# Patient Record
Sex: Male | Born: 1955 | Race: White | Hispanic: No | Marital: Married | Smoking: Never smoker
Health system: Southern US, Community
[De-identification: ages and names within clinical notes are randomized; demographics above are authoritative.]

## PROBLEM LIST (undated history)

## (undated) DIAGNOSIS — E785 Hyperlipidemia, unspecified: Secondary | ICD-10-CM

## (undated) DIAGNOSIS — K5792 Diverticulitis of intestine, part unspecified, without perforation or abscess without bleeding: Secondary | ICD-10-CM

## (undated) DIAGNOSIS — K219 Gastro-esophageal reflux disease without esophagitis: Secondary | ICD-10-CM

## (undated) DIAGNOSIS — T7840XA Allergy, unspecified, initial encounter: Secondary | ICD-10-CM

## (undated) DIAGNOSIS — D126 Benign neoplasm of colon, unspecified: Secondary | ICD-10-CM

## (undated) DIAGNOSIS — C801 Malignant (primary) neoplasm, unspecified: Secondary | ICD-10-CM

## (undated) DIAGNOSIS — I10 Essential (primary) hypertension: Secondary | ICD-10-CM

## (undated) DIAGNOSIS — K56609 Unspecified intestinal obstruction, unspecified as to partial versus complete obstruction: Secondary | ICD-10-CM

## (undated) DIAGNOSIS — E119 Type 2 diabetes mellitus without complications: Secondary | ICD-10-CM

## (undated) DIAGNOSIS — G4730 Sleep apnea, unspecified: Secondary | ICD-10-CM

## (undated) HISTORY — DX: Allergy, unspecified, initial encounter: T78.40XA

## (undated) HISTORY — DX: Benign neoplasm of colon, unspecified: D12.6

## (undated) HISTORY — DX: Type 2 diabetes mellitus without complications: E11.9

## (undated) HISTORY — DX: Sleep apnea, unspecified: G47.30

## (undated) HISTORY — DX: Essential (primary) hypertension: I10

## (undated) HISTORY — PX: COLONOSCOPY: SHX174

## (undated) HISTORY — DX: Hyperlipidemia, unspecified: E78.5

## (undated) HISTORY — DX: Diverticulitis of intestine, part unspecified, without perforation or abscess without bleeding: K57.92

## (undated) HISTORY — DX: Unspecified intestinal obstruction, unspecified as to partial versus complete obstruction: K56.609

## (undated) HISTORY — DX: Malignant (primary) neoplasm, unspecified: C80.1

## (undated) HISTORY — DX: Gastro-esophageal reflux disease without esophagitis: K21.9

---

## 1995-12-16 HISTORY — PX: VASECTOMY: SHX75

## 2003-03-30 ENCOUNTER — Encounter: Payer: Self-pay | Admitting: Family Medicine

## 2004-11-26 ENCOUNTER — Ambulatory Visit: Payer: Self-pay | Admitting: Family Medicine

## 2004-12-26 ENCOUNTER — Ambulatory Visit: Payer: Self-pay | Admitting: Family Medicine

## 2005-01-06 ENCOUNTER — Ambulatory Visit: Payer: Self-pay | Admitting: Family Medicine

## 2005-05-15 ENCOUNTER — Ambulatory Visit: Payer: Self-pay | Admitting: Family Medicine

## 2005-05-19 ENCOUNTER — Ambulatory Visit: Payer: Self-pay | Admitting: Family Medicine

## 2005-09-19 ENCOUNTER — Ambulatory Visit: Payer: Self-pay | Admitting: Family Medicine

## 2005-09-30 ENCOUNTER — Ambulatory Visit: Payer: Self-pay | Admitting: Family Medicine

## 2006-09-22 ENCOUNTER — Ambulatory Visit: Payer: Self-pay | Admitting: Family Medicine

## 2007-02-18 ENCOUNTER — Ambulatory Visit: Payer: Self-pay | Admitting: Family Medicine

## 2007-06-15 ENCOUNTER — Encounter: Payer: Self-pay | Admitting: Family Medicine

## 2007-06-15 DIAGNOSIS — J309 Allergic rhinitis, unspecified: Secondary | ICD-10-CM | POA: Insufficient documentation

## 2007-06-15 DIAGNOSIS — E7800 Pure hypercholesterolemia, unspecified: Secondary | ICD-10-CM

## 2007-06-17 ENCOUNTER — Ambulatory Visit: Payer: Self-pay | Admitting: Family Medicine

## 2007-06-21 LAB — CONVERTED CEMR LAB
Albumin: 3.4 g/dL — ABNORMAL LOW (ref 3.5–5.2)
Alkaline Phosphatase: 51 units/L (ref 39–117)
BUN: 12 mg/dL (ref 6–23)
Direct LDL: 159.7 mg/dL
GFR calc Af Amer: 115 mL/min
GFR calc non Af Amer: 95 mL/min
LDL Cholesterol: 147 mg/dL — ABNORMAL HIGH (ref 0–99)
PSA: 1.14 ng/mL (ref 0.10–4.00)
Potassium: 4.4 meq/L (ref 3.5–5.1)
Sodium: 141 meq/L (ref 135–145)
TSH: 2.69 microintl units/mL (ref 0.35–5.50)
Total CHOL/HDL Ratio: 5
Triglycerides: 83 mg/dL (ref 0–149)
VLDL: 17 mg/dL (ref 0–40)

## 2007-06-22 ENCOUNTER — Ambulatory Visit: Payer: Self-pay | Admitting: Family Medicine

## 2007-06-22 DIAGNOSIS — E1169 Type 2 diabetes mellitus with other specified complication: Secondary | ICD-10-CM | POA: Insufficient documentation

## 2007-06-22 DIAGNOSIS — E119 Type 2 diabetes mellitus without complications: Secondary | ICD-10-CM | POA: Insufficient documentation

## 2007-06-22 DIAGNOSIS — E669 Obesity, unspecified: Secondary | ICD-10-CM

## 2007-09-24 ENCOUNTER — Ambulatory Visit: Payer: Self-pay | Admitting: Family Medicine

## 2008-06-12 ENCOUNTER — Encounter: Payer: Self-pay | Admitting: Family Medicine

## 2008-09-18 ENCOUNTER — Ambulatory Visit: Payer: Self-pay | Admitting: Family Medicine

## 2008-12-15 DIAGNOSIS — D126 Benign neoplasm of colon, unspecified: Secondary | ICD-10-CM

## 2008-12-15 HISTORY — DX: Benign neoplasm of colon, unspecified: D12.6

## 2009-02-13 ENCOUNTER — Ambulatory Visit: Payer: Self-pay | Admitting: Family Medicine

## 2009-02-18 LAB — CONVERTED CEMR LAB
ALT: 34 units/L (ref 0–53)
Albumin: 3.6 g/dL (ref 3.5–5.2)
Alkaline Phosphatase: 57 units/L (ref 39–117)
BUN: 12 mg/dL (ref 6–23)
Bilirubin, Direct: 0.2 mg/dL (ref 0.0–0.3)
CO2: 29 meq/L (ref 19–32)
Calcium: 8.9 mg/dL (ref 8.4–10.5)
GFR calc Af Amer: 114 mL/min
Glucose, Bld: 113 mg/dL — ABNORMAL HIGH (ref 70–99)
HDL: 41.7 mg/dL (ref >39.0)
PSA: 0.92 ng/mL (ref 0.10–4.00)
TSH: 2.97 microintl units/mL (ref 0.35–5.50)
Total Protein: 7.1 g/dL (ref 6.0–8.3)
Triglycerides: 75 mg/dL (ref 0–149)

## 2009-02-19 ENCOUNTER — Ambulatory Visit: Payer: Self-pay | Admitting: Family Medicine

## 2009-02-19 DIAGNOSIS — I10 Essential (primary) hypertension: Secondary | ICD-10-CM | POA: Insufficient documentation

## 2009-03-12 ENCOUNTER — Ambulatory Visit: Payer: Self-pay | Admitting: Gastroenterology

## 2009-03-22 ENCOUNTER — Ambulatory Visit: Payer: Self-pay | Admitting: Family Medicine

## 2009-03-26 ENCOUNTER — Ambulatory Visit: Payer: Self-pay | Admitting: Gastroenterology

## 2009-03-26 ENCOUNTER — Encounter: Payer: Self-pay | Admitting: Gastroenterology

## 2009-03-28 ENCOUNTER — Encounter: Payer: Self-pay | Admitting: Gastroenterology

## 2009-07-02 ENCOUNTER — Encounter (INDEPENDENT_AMBULATORY_CARE_PROVIDER_SITE_OTHER): Payer: Self-pay | Admitting: *Deleted

## 2009-07-10 ENCOUNTER — Ambulatory Visit: Payer: Self-pay | Admitting: Family Medicine

## 2009-08-14 ENCOUNTER — Telehealth: Payer: Self-pay | Admitting: Family Medicine

## 2009-08-14 ENCOUNTER — Encounter: Payer: Self-pay | Admitting: Family Medicine

## 2009-08-21 ENCOUNTER — Encounter (INDEPENDENT_AMBULATORY_CARE_PROVIDER_SITE_OTHER): Payer: Self-pay | Admitting: *Deleted

## 2009-11-12 ENCOUNTER — Encounter: Payer: Self-pay | Admitting: Family Medicine

## 2010-07-17 ENCOUNTER — Encounter (INDEPENDENT_AMBULATORY_CARE_PROVIDER_SITE_OTHER): Payer: Self-pay | Admitting: *Deleted

## 2010-10-03 ENCOUNTER — Encounter: Payer: Self-pay | Admitting: Family Medicine

## 2011-01-14 NOTE — Letter (Signed)
Summary: Nadara Eaton letter  [place] [facility]  [address] [place], [place] [address]   Phone: [phone]  Fax: [phone]       07/17/2010 MRN: [id number]  [facility] [address] [place], [place]  [address]  Dear Mr. HACKMAN,  [facility] [place], and [facility] announce the retirement of Arta Silence, M.D., from full-time practice at the [facility] office effective June 13, 2010 and his plans of returning part-time.  It is important to Dr. Hetty Ely and to our practice that you understand that [facility] has seven physicians in our office for your health care needs.  We will continue to offer the same exceptional care that you have today.    Dr. Hetty Ely has spoken to many of you about his plans for retirement and returning part-time in the fall.   We will continue to work with you through the transition to schedule appointments for you in the office and meet the high standards that [place] is committed to.   Again, it is with great pleasure that we share the news that Dr. Hetty Ely will return to [facility] at [facility] in October of 2011 with a reduced schedule.    If you have any questions, or would like to request an appointment with one of our physicians, please call us at [phone] and press the option for Scheduling an appointment.  We take pleasure in providing you with excellent patient care and look forward to seeing you at your next office visit.  Our [facility] Physicians are:  Tillman Abide, M.D. Laurita Quint, M.D. Roxy Manns, M.D. Kerby Nora, M.D. Hannah Beat, M.D. Ruthe Mannan, M.D. We proudly welcomed Raechel Ache, M.D. and Eustaquio Boyden, M.D. to the practice in July/August 2011.  Sincerely,  [place] [facility]

## 2011-01-14 NOTE — Miscellaneous (Signed)
Summary: Flu vaccine  Clinical Lists Changes  Observations: Added new observation of FLU VAX: Historical (09/23/2010 10:34)      Influenza Immunization History:    Influenza # 1:  Historical (09/23/2010) Received form from [facility]/ [address] [place], [place]

## 2012-09-24 ENCOUNTER — Telehealth: Payer: Self-pay | Admitting: Family Medicine

## 2012-09-24 NOTE — Telephone Encounter (Signed)
Fine by me 

## 2012-09-24 NOTE — Telephone Encounter (Signed)
Okay with me if okay with Dr Reece Agar.

## 2012-09-24 NOTE — Telephone Encounter (Signed)
Please schedule

## 2012-10-13 ENCOUNTER — Ambulatory Visit: Payer: Self-pay

## 2012-10-13 ENCOUNTER — Other Ambulatory Visit: Payer: Self-pay | Admitting: Occupational Medicine

## 2012-10-13 DIAGNOSIS — M25561 Pain in right knee: Secondary | ICD-10-CM

## 2012-10-28 ENCOUNTER — Other Ambulatory Visit: Payer: Self-pay | Admitting: Family Medicine

## 2012-10-30 ENCOUNTER — Other Ambulatory Visit: Payer: Self-pay | Admitting: Family Medicine

## 2012-10-30 DIAGNOSIS — E7800 Pure hypercholesterolemia, unspecified: Secondary | ICD-10-CM

## 2012-10-30 DIAGNOSIS — Z125 Encounter for screening for malignant neoplasm of prostate: Secondary | ICD-10-CM

## 2012-11-05 ENCOUNTER — Other Ambulatory Visit (INDEPENDENT_AMBULATORY_CARE_PROVIDER_SITE_OTHER): Payer: Self-pay

## 2012-11-05 DIAGNOSIS — Z125 Encounter for screening for malignant neoplasm of prostate: Secondary | ICD-10-CM

## 2012-11-05 DIAGNOSIS — E7800 Pure hypercholesterolemia, unspecified: Secondary | ICD-10-CM

## 2012-11-05 LAB — COMPREHENSIVE METABOLIC PANEL
AST: 24 U/L (ref 0–37)
Albumin: 3.7 g/dL (ref 3.5–5.2)
Alkaline Phosphatase: 59 U/L (ref 39–117)
BUN: 14 mg/dL (ref 6–23)
Potassium: 4.3 mEq/L (ref 3.5–5.1)
Sodium: 136 mEq/L (ref 135–145)
Total Protein: 7.1 g/dL (ref 6.0–8.3)

## 2012-11-05 LAB — LIPID PANEL
HDL: 43.2 mg/dL (ref >39.00)
VLDL: 13.6 mg/dL (ref 0.0–40.0)

## 2012-11-05 LAB — LDL CHOLESTEROL, DIRECT: Direct LDL: 157.7 mg/dL

## 2012-11-09 ENCOUNTER — Encounter: Payer: Self-pay | Admitting: Family Medicine

## 2012-11-11 ENCOUNTER — Telehealth: Payer: Self-pay | Admitting: Family Medicine

## 2012-11-11 NOTE — Telephone Encounter (Signed)
Called pt's wife.  I am currently sick and will need to cancel clinic tomorrow.  I called them to reschedule.  Pt's wife will pass the message along.

## 2012-11-12 ENCOUNTER — Encounter: Payer: Self-pay | Admitting: Family Medicine

## 2012-11-15 ENCOUNTER — Encounter: Payer: Self-pay | Admitting: Family Medicine

## 2012-11-15 ENCOUNTER — Ambulatory Visit (INDEPENDENT_AMBULATORY_CARE_PROVIDER_SITE_OTHER): Payer: BC Managed Care – PPO | Admitting: Family Medicine

## 2012-11-15 VITALS — BP 136/100 | HR 83 | Temp 98.7°F | Ht 70.0 in | Wt 334.0 lb

## 2012-11-15 DIAGNOSIS — R7309 Other abnormal glucose: Secondary | ICD-10-CM

## 2012-11-15 DIAGNOSIS — I10 Essential (primary) hypertension: Secondary | ICD-10-CM

## 2012-11-15 DIAGNOSIS — R739 Hyperglycemia, unspecified: Secondary | ICD-10-CM

## 2012-11-15 DIAGNOSIS — E119 Type 2 diabetes mellitus without complications: Secondary | ICD-10-CM

## 2012-11-15 DIAGNOSIS — E669 Obesity, unspecified: Secondary | ICD-10-CM

## 2012-11-15 DIAGNOSIS — Z0000 Encounter for general adult medical examination without abnormal findings: Secondary | ICD-10-CM

## 2012-11-15 DIAGNOSIS — E1169 Type 2 diabetes mellitus with other specified complication: Secondary | ICD-10-CM

## 2012-11-15 DIAGNOSIS — E7800 Pure hypercholesterolemia, unspecified: Secondary | ICD-10-CM

## 2012-11-15 LAB — HEMOGLOBIN A1C: Hgb A1c MFr Bld: 8 % — ABNORMAL HIGH (ref 4.6–6.5)

## 2012-11-15 MED ORDER — SILDENAFIL CITRATE 100 MG PO TABS: 50.0000 mg | ORAL_TABLET | Freq: Every day | ORAL | Status: DC | PRN

## 2012-11-15 NOTE — Assessment & Plan Note (Signed)
New dx.  See notes on labs.  Recheck A1c in 3 months.  Diet and weight reduction in meantime.

## 2012-11-15 NOTE — Progress Notes (Signed)
CPE- See plan.  Routine anticipatory guidance given to patient.  See health maintenance. Weight is up.  Discussed.  Colonoscopy 2010, 10 year f/u.   PSA wnl.   Advance directive d/w pt.  Wife is designated.    Tetanus shot 2008 Flu shot done at work.   Sugar is up. Discussed.  See notes on f/u labs.   He hurt his R knee when working this summer.  He has been seen via worker's comp/ortho last week.  He has been injected by ortho.    PMH and SH reviewed  Meds, vitals, and allergies reviewed.   ROS: See HPI.  Otherwise negative.    GEN: nad, alert and oriented, obese HEENT: mucous membranes moist NECK: supple w/o LA CV: rrr. PULM: ctab, no inc wob ABD: soft, +bs EXT: no edema SKIN: no acute rash

## 2012-11-15 NOTE — Patient Instructions (Addendum)
Go to the lab on the way out.  We'll contact you with your lab report. Look up [email or web address] in the meantime.   Goal 1-2 lbs per month weight loss.  We'll set follow up when I see you other labs.  Take care.

## 2012-11-16 DIAGNOSIS — Z0000 Encounter for general adult medical examination without abnormal findings: Secondary | ICD-10-CM | POA: Insufficient documentation

## 2012-11-16 NOTE — Assessment & Plan Note (Signed)
Need to address sugar first.

## 2012-11-16 NOTE — Assessment & Plan Note (Signed)
Routine anticipatory guidance given to patient.  See health maintenance. Weight is up.  Discussed.  Colonoscopy 2010, 10 year f/u.   PSA wnl.   Advance directive d/w pt.  Wife is designated.    Tetanus shot 2008 Flu shot done at work.

## 2012-11-16 NOTE — Assessment & Plan Note (Signed)
Needs to lose weight.  D/w pt about diet, spec low carb.

## 2012-11-16 NOTE — Assessment & Plan Note (Signed)
Will work on weight loss first.

## 2012-12-15 HISTORY — PX: KNEE ARTHROSCOPY: SUR90

## 2013-02-10 ENCOUNTER — Other Ambulatory Visit (INDEPENDENT_AMBULATORY_CARE_PROVIDER_SITE_OTHER): Payer: BC Managed Care – PPO

## 2013-02-10 DIAGNOSIS — E1169 Type 2 diabetes mellitus with other specified complication: Secondary | ICD-10-CM

## 2013-02-10 DIAGNOSIS — E119 Type 2 diabetes mellitus without complications: Secondary | ICD-10-CM

## 2013-02-10 DIAGNOSIS — E669 Obesity, unspecified: Secondary | ICD-10-CM

## 2013-02-10 LAB — HEMOGLOBIN A1C: Hgb A1c MFr Bld: 6.9 % — ABNORMAL HIGH (ref 4.6–6.5)

## 2013-02-15 ENCOUNTER — Ambulatory Visit: Payer: BC Managed Care – PPO | Admitting: Family Medicine

## 2013-02-18 ENCOUNTER — Ambulatory Visit: Payer: BC Managed Care – PPO | Admitting: Family Medicine

## 2013-02-25 ENCOUNTER — Encounter: Payer: Self-pay | Admitting: Family Medicine

## 2013-02-25 ENCOUNTER — Ambulatory Visit (INDEPENDENT_AMBULATORY_CARE_PROVIDER_SITE_OTHER): Payer: BC Managed Care – PPO | Admitting: Family Medicine

## 2013-02-25 VITALS — BP 162/96 | HR 82 | Temp 98.1°F | Wt 319.5 lb

## 2013-02-25 DIAGNOSIS — E119 Type 2 diabetes mellitus without complications: Secondary | ICD-10-CM

## 2013-02-25 DIAGNOSIS — E1169 Type 2 diabetes mellitus with other specified complication: Secondary | ICD-10-CM

## 2013-02-25 DIAGNOSIS — E669 Obesity, unspecified: Secondary | ICD-10-CM

## 2013-02-25 MED ORDER — LISINOPRIL 5 MG PO TABS: 5.0000 mg | ORAL_TABLET | Freq: Every day | ORAL | Status: DC

## 2013-02-25 NOTE — Patient Instructions (Signed)
Start the lisinopril at 5mg  a day.  If lightheaded, skip a few days and then restart at 2.5mg  a day.   Call with concerns.  Keep working on your [employer] and diet.  Thanks for your effort.  Recheck in 3 months, fasting labs ahead of time.  Glad to see you.

## 2013-02-25 NOTE — Progress Notes (Signed)
He's still seeing worker's comp about his R knee, MRI was done yesterday.  He has f/u with ortho pending about the scan. He continues to have knee pain.    In spite of that, he's been trying to walk some and work on diet.  Weight is down intentionally.  He cut out sodas and started eating breakfast.  More salads.  "I'm watching what I eat."  He's cut out tea.  He feels better. A1c improved to 6.9. He hasn't checked his sugar at home.  Encouraged eye exam.    Meds, vitals, and allergies reviewed.   ROS: See HPI.  Otherwise, noncontributory.  GEN: nad, alert and oriented HEENT: mucous membranes moist NECK: supple w/o LA CV: rrr PULM: ctab, no inc wob ABD: soft, +bs EXT: no edema SKIN: no acute rash  Diabetic foot exam: Normal inspection No skin breakdown No calluses  Normal DP pulses Normal sensation to light touch and monofilament Nails normal

## 2013-02-25 NOTE — Assessment & Plan Note (Addendum)
A1c improved with diet.  D/w pt.  Continue to work on diet, add on ACE.  Recheck in 3 months.  I thanked him for his effort.  No DM2 meds at this point.  >25 min spent with face to face with patient, >50% counseling and/or coordinating care

## 2013-03-02 ENCOUNTER — Telehealth: Payer: Self-pay | Admitting: Family Medicine

## 2013-03-02 NOTE — Telephone Encounter (Signed)
Needs 30 min OV, needs EKG and discussion at the appointment.

## 2013-03-02 NOTE — Telephone Encounter (Signed)
[facility] requesting authorization from Dr. Para March to do R knee arthroscopy.  Please call Alfonso Ramus at [phone].

## 2013-03-02 NOTE — Telephone Encounter (Signed)
Advised Karen at [facility].  She will call patient and advise him, so that he will call to schedule appt.

## 2013-03-04 ENCOUNTER — Ambulatory Visit: Payer: BC Managed Care – PPO | Admitting: Family Medicine

## 2013-03-07 ENCOUNTER — Ambulatory Visit (INDEPENDENT_AMBULATORY_CARE_PROVIDER_SITE_OTHER): Payer: BC Managed Care – PPO | Admitting: Family Medicine

## 2013-03-07 ENCOUNTER — Encounter: Payer: Self-pay | Admitting: Family Medicine

## 2013-03-07 VITALS — BP 142/82 | HR 71 | Temp 98.4°F | Wt 319.5 lb

## 2013-03-07 DIAGNOSIS — M25561 Pain in right knee: Secondary | ICD-10-CM

## 2013-03-07 DIAGNOSIS — M25569 Pain in unspecified knee: Secondary | ICD-10-CM

## 2013-03-07 DIAGNOSIS — Z029 Encounter for administrative examinations, unspecified: Secondary | ICD-10-CM

## 2013-03-07 DIAGNOSIS — I10 Essential (primary) hypertension: Secondary | ICD-10-CM

## 2013-03-07 LAB — BASIC METABOLIC PANEL
Calcium: 8.8 mg/dL (ref 8.4–10.5)
Creatinine, Ser: 0.8 mg/dL (ref 0.4–1.5)

## 2013-03-07 NOTE — Assessment & Plan Note (Addendum)
Per ortho.   H/o DM2, but no meds. Now controlled by A1c.  HTN is improved on ACE.  H/o very mild HLD.  No h/o MI, CVA, stent, angioplasty, heart surgery, CHF. No h/o renal disease.  Can walk on flat ground, w/o SOB/CP.  Stairs are limited by knee pain, not by CV disease.  He could walk 2-3 miles if needed.  D/w pt about risk/benefits of surgery.  EKG wnl  Would be appropriately low risk for surgery.  Will notify ortho.  Labs unremarkable.  Will defer other preop labs to ortho/anesthesia.

## 2013-03-07 NOTE — Patient Instructions (Signed)
Go to the lab on the way out.  We'll contact you with your lab report.  I'll work on the papers for ortho. Take care.  Glad to see you.

## 2013-03-07 NOTE — Progress Notes (Signed)
Planned for potential R knee scope.  H/o DJD and pain.   H/o DM2, but no meds.  Now controlled by A1c.   HTN is improved on ACE.  H/o very mild HLD.  No h/o MI, CVA, stent, angioplasty, heart surgery, CHF.  No h/o renal disease.   Can walk on flat ground, w/o SOB/CP.   Stairs are limited by knee pain, not by CV disease.   He could walk 2-3 miles if needed.    D/w pt about risk/benefits of surgery.   Meds, vitals, and allergies reviewed.   ROS: See HPI.  Otherwise, noncontributory.  GEN: nad, alert and oriented  HEENT: mucous membranes moist  NECK: supple w/o LA  CV: rrr  PULM: ctab, no inc wob  ABD: soft, +bs  EXT: no edema  SKIN: no acute rash   EKG wnl today.

## 2013-05-26 ENCOUNTER — Other Ambulatory Visit (INDEPENDENT_AMBULATORY_CARE_PROVIDER_SITE_OTHER): Payer: BC Managed Care – PPO

## 2013-05-26 DIAGNOSIS — E119 Type 2 diabetes mellitus without complications: Secondary | ICD-10-CM

## 2013-05-26 DIAGNOSIS — E1169 Type 2 diabetes mellitus with other specified complication: Secondary | ICD-10-CM

## 2013-05-26 DIAGNOSIS — E669 Obesity, unspecified: Secondary | ICD-10-CM

## 2013-05-26 LAB — LIPID PANEL
Cholesterol: 187 mg/dL (ref 0–200)
Triglycerides: 58 mg/dL (ref 0.0–149.0)

## 2013-06-02 ENCOUNTER — Encounter: Payer: Self-pay | Admitting: Family Medicine

## 2013-06-02 ENCOUNTER — Ambulatory Visit (INDEPENDENT_AMBULATORY_CARE_PROVIDER_SITE_OTHER): Payer: BC Managed Care – PPO | Admitting: Family Medicine

## 2013-06-02 VITALS — BP 146/96 | HR 82 | Temp 98.7°F | Wt 324.5 lb

## 2013-06-02 DIAGNOSIS — E7800 Pure hypercholesterolemia, unspecified: Secondary | ICD-10-CM

## 2013-06-02 DIAGNOSIS — I10 Essential (primary) hypertension: Secondary | ICD-10-CM

## 2013-06-02 DIAGNOSIS — E669 Obesity, unspecified: Secondary | ICD-10-CM

## 2013-06-02 DIAGNOSIS — E119 Type 2 diabetes mellitus without complications: Secondary | ICD-10-CM

## 2013-06-02 DIAGNOSIS — E1169 Type 2 diabetes mellitus with other specified complication: Secondary | ICD-10-CM

## 2013-06-02 DIAGNOSIS — M25561 Pain in right knee: Secondary | ICD-10-CM

## 2013-06-02 DIAGNOSIS — M25569 Pain in unspecified knee: Secondary | ICD-10-CM

## 2013-06-02 MED ORDER — SILDENAFIL CITRATE 100 MG PO TABS: 50.0000 mg | ORAL_TABLET | Freq: Every day | ORAL | Status: DC | PRN

## 2013-06-02 NOTE — Assessment & Plan Note (Signed)
He'll work on diet and start back walking, recheck in 6 months.  He agrees.

## 2013-06-02 NOTE — Assessment & Plan Note (Signed)
Improved

## 2013-06-02 NOTE — Patient Instructions (Addendum)
Schedule a physical in about 6 months, labs ahead of time.  Keep working on your diet and try get back to walking for exercise. Glad to see you.  I would get a flu shot each fall.

## 2013-06-02 NOTE — Assessment & Plan Note (Signed)
Improved, would have pt work on weight via diet/exercise.

## 2013-06-02 NOTE — Progress Notes (Signed)
Diabetes:  No meds Hypoglycemic episodes:no Hyperglycemic episodes:no Feet problems:no Blood Sugars averaging: rarely checked eye exam within last year: due for f/u, scheduled for this summer.  A1c 7.0. He's gardening this year.    Elevated Cholesterol: No meds.  Labs d/w pt.  Diet compliance: "I've cut way back."  Bedtime snack is a difficult spot for him.  He's talking with his wife about keeping healthy foods around the house.   Exercise: see below  He doesn't have R knee pain after his prev scope via ortho. He's going to exercise more now.    His BP had been fine at home.  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  "today is a stressful day."   PMH and SH reviewed  Meds, vitals, and allergies reviewed.   ROS: See HPI.  Otherwise negative.    GEN: nad, alert and oriented, obese HEENT: mucous membranes moist NECK: supple w/o LA CV: rrr. PULM: ctab, no inc wob EXT: no edema SKIN: no acute rash  Diabetic foot exam: Normal inspection No skin breakdown No calluses  Normal DP pulses Normal sensation to light touch and monofilament Nails normal

## 2013-06-02 NOTE — Assessment & Plan Note (Signed)
Had been controlled, would continue as is with current dose.

## 2013-06-11 ENCOUNTER — Ambulatory Visit (INDEPENDENT_AMBULATORY_CARE_PROVIDER_SITE_OTHER): Payer: BC Managed Care – PPO | Admitting: Family Medicine

## 2013-06-11 ENCOUNTER — Encounter: Payer: Self-pay | Admitting: Family Medicine

## 2013-06-11 VITALS — BP 142/84 | Temp 97.9°F | Wt 318.0 lb

## 2013-06-11 DIAGNOSIS — J209 Acute bronchitis, unspecified: Secondary | ICD-10-CM

## 2013-06-11 MED ORDER — HYDROCODONE-HOMATROPINE 5-1.5 MG/5ML PO SYRP: 5.0000 mL | ORAL_SOLUTION | ORAL | Status: DC | PRN

## 2013-06-11 MED ORDER — AZITHROMYCIN 250 MG PO TABS: ORAL_TABLET | ORAL | Status: DC

## 2013-06-11 NOTE — Progress Notes (Signed)
  Subjective:    Patient ID: Brett Stanley, male    DOB: Jul 23, 1956, 57 y.o.   MRN: [id number]  HPI Here for one week of stuffy head, PND, ST, chest congestion, and coughing up yellow sputum. No fever. He tried Mucinex.    Review of Systems  Constitutional: Negative.   HENT: Positive for congestion and postnasal drip.   Respiratory: Positive for cough.        Objective:   Physical Exam  Constitutional: He appears well-developed and well-nourished.  HENT:  Right Ear: External ear normal.  Left Ear: External ear normal.  Nose: Nose normal.  Mouth/Throat: Oropharynx is clear and moist.  Eyes: Conjunctivae are normal.  Neck: Neck supple.  Pulmonary/Chest: Effort normal. No respiratory distress. He has no wheezes. He has no rales.  Scattered rhonchi   Lymphadenopathy:    He has no cervical adenopathy.          Assessment & Plan:  Drink fluids. Recheck prn

## 2013-06-23 ENCOUNTER — Other Ambulatory Visit: Payer: Self-pay

## 2013-08-01 ENCOUNTER — Ambulatory Visit (INDEPENDENT_AMBULATORY_CARE_PROVIDER_SITE_OTHER): Payer: BC Managed Care – PPO | Admitting: Family Medicine

## 2013-08-01 ENCOUNTER — Encounter: Payer: Self-pay | Admitting: Family Medicine

## 2013-08-01 VITALS — BP 108/70 | HR 84 | Temp 98.4°F | Wt 317.2 lb

## 2013-08-01 DIAGNOSIS — N419 Inflammatory disease of prostate, unspecified: Secondary | ICD-10-CM

## 2013-08-01 MED ORDER — CIPROFLOXACIN HCL 500 MG PO TABS: 500.0000 mg | ORAL_TABLET | Freq: Two times a day (BID) | ORAL | Status: DC

## 2013-08-01 NOTE — Patient Instructions (Addendum)
Drink plenty of water and start the antibiotics today.  Let us know if you don't improve.  Take care.

## 2013-08-01 NOTE — Progress Notes (Signed)
Urinary urgency and frequency, some discomfort with urination.  Back is sore, started yesterday.  Fever up to 101.8. Shirt was soaked last night. No blood in urine per patient report.  Stream is variable.  Started about 48 hours ago.  No h/o renal stones.  No diarrhea.  Normal BMs. "I can't get comfortable."  No scrotal pain.  No vomiting. Diffuse pelvic discomfort w/o focal area of pain.   Meds, vitals, and allergies reviewed.   ROS: See HPI.  Otherwise, noncontributory.  nad ncat Mmm rrr ctab abd soft, not ttp, normal BS No cva pain Prostate tender and boggy on exam.

## 2013-08-01 NOTE — Assessment & Plan Note (Signed)
Likely dx.  Start cipro and f/u prn.  Path/phys d/w pt.  Nontoxic.  Okay for outpatient f/u.

## 2013-08-16 ENCOUNTER — Encounter: Payer: Self-pay | Admitting: Family Medicine

## 2013-08-16 ENCOUNTER — Ambulatory Visit: Payer: BC Managed Care – PPO | Admitting: Family Medicine

## 2013-08-16 ENCOUNTER — Ambulatory Visit (INDEPENDENT_AMBULATORY_CARE_PROVIDER_SITE_OTHER): Payer: BC Managed Care – PPO | Admitting: Family Medicine

## 2013-08-16 VITALS — BP 130/86 | HR 82 | Temp 98.0°F | Ht 70.0 in | Wt 315.0 lb

## 2013-08-16 DIAGNOSIS — N419 Inflammatory disease of prostate, unspecified: Secondary | ICD-10-CM

## 2013-08-16 NOTE — Patient Instructions (Addendum)
I would use a barrier cream for now along with a doughnut pillow.  Keep taking the antibiotics.   Take colace 100mg  a day and if needed add on miralax daily.  Take care.  This should improve.

## 2013-08-16 NOTE — Progress Notes (Signed)
Still on abx.  He was getting better initially on the abx. No back pain and no fevers now.  He still has some urinary urgency.  His stream is sometimes improved, sometimes weaker vs stronger than others. Some discomfort/irritatoin with occ urination at the tip of the shaft.   His "rear end" is sore since being on abx. He is less regular with BMs on the cipro, but not having diarrhea. He has fecal urgency with smaller BMs but w/o incontinence.  He is having to strain for BMs.   Meds, vitals, and allergies reviewed.   ROS: See HPI.  Otherwise, noncontributory.  nad Testes bilaterally descended without nodularity, tenderness or masses. No scrotal masses or lesions. No penis lesions or urethral discharge. Prostate tender on the L>R side but much less boggy on exam.  Gluteal crease irritated but w/o fungal appearing elements, ie no satellite lesions.  Small hemorrhoid on the anal verge noted, not thrombosed.

## 2013-08-16 NOTE — Assessment & Plan Note (Signed)
With likely constipation from the cipro.  Add on miralax and or colace and should improve.  Would use barrier cream and avoid straining.  Should resolve with the remaining cipro.  No fevers and sweats now.  Prostate less ttp and not as boggy.  D/w pt.  F/u prn.  He agrees.  Nontoxic.

## 2013-08-31 ENCOUNTER — Telehealth: Payer: Self-pay | Admitting: Family Medicine

## 2013-08-31 MED ORDER — CIPROFLOXACIN HCL 500 MG PO TABS: 500.0000 mg | ORAL_TABLET | Freq: Two times a day (BID) | ORAL | Status: DC

## 2013-08-31 NOTE — Telephone Encounter (Signed)
D/w pt.  Just finished the cipro.  Sx much improved.  Would hold off on more treatment for now.  rx sent for patient to get if the sx return in the next few days.  He agrees.

## 2013-11-02 ENCOUNTER — Other Ambulatory Visit: Payer: Self-pay | Admitting: Family Medicine

## 2013-11-02 DIAGNOSIS — E1169 Type 2 diabetes mellitus with other specified complication: Secondary | ICD-10-CM

## 2013-11-02 DIAGNOSIS — Z125 Encounter for screening for malignant neoplasm of prostate: Secondary | ICD-10-CM

## 2013-11-07 ENCOUNTER — Other Ambulatory Visit (INDEPENDENT_AMBULATORY_CARE_PROVIDER_SITE_OTHER): Payer: BC Managed Care – PPO

## 2013-11-07 DIAGNOSIS — E119 Type 2 diabetes mellitus without complications: Secondary | ICD-10-CM

## 2013-11-07 DIAGNOSIS — Z125 Encounter for screening for malignant neoplasm of prostate: Secondary | ICD-10-CM

## 2013-11-07 DIAGNOSIS — E669 Obesity, unspecified: Secondary | ICD-10-CM

## 2013-11-07 DIAGNOSIS — E1169 Type 2 diabetes mellitus with other specified complication: Secondary | ICD-10-CM

## 2013-11-07 LAB — COMPREHENSIVE METABOLIC PANEL
AST: 24 U/L (ref 0–37)
Alkaline Phosphatase: 49 U/L (ref 39–117)
BUN: 13 mg/dL (ref 6–23)
Creatinine, Ser: 0.9 mg/dL (ref 0.4–1.5)
Potassium: 5 mEq/L (ref 3.5–5.1)
Total Bilirubin: 0.7 mg/dL (ref 0.3–1.2)

## 2013-11-07 LAB — LIPID PANEL
Cholesterol: 213 mg/dL — ABNORMAL HIGH (ref 0–200)
HDL: 49.6 mg/dL (ref >39.00)
Total CHOL/HDL Ratio: 4
Triglycerides: 108 mg/dL (ref 0.0–149.0)
VLDL: 21.6 mg/dL (ref 0.0–40.0)

## 2013-11-07 LAB — LDL CHOLESTEROL, DIRECT: Direct LDL: 155.9 mg/dL

## 2013-11-07 LAB — HEMOGLOBIN A1C: Hgb A1c MFr Bld: 7.5 % — ABNORMAL HIGH (ref 4.6–6.5)

## 2013-11-07 LAB — PSA: PSA: 2.23 ng/mL (ref 0.10–4.00)

## 2013-11-14 ENCOUNTER — Encounter: Payer: Self-pay | Admitting: Family Medicine

## 2013-11-14 ENCOUNTER — Ambulatory Visit (INDEPENDENT_AMBULATORY_CARE_PROVIDER_SITE_OTHER): Payer: BC Managed Care – PPO | Admitting: Family Medicine

## 2013-11-14 VITALS — BP 146/96 | HR 88 | Temp 98.2°F | Ht 70.5 in | Wt 332.0 lb

## 2013-11-14 DIAGNOSIS — E7800 Pure hypercholesterolemia, unspecified: Secondary | ICD-10-CM

## 2013-11-14 DIAGNOSIS — E119 Type 2 diabetes mellitus without complications: Secondary | ICD-10-CM

## 2013-11-14 DIAGNOSIS — E1169 Type 2 diabetes mellitus with other specified complication: Secondary | ICD-10-CM

## 2013-11-14 DIAGNOSIS — I10 Essential (primary) hypertension: Secondary | ICD-10-CM

## 2013-11-14 DIAGNOSIS — Z23 Encounter for immunization: Secondary | ICD-10-CM

## 2013-11-14 DIAGNOSIS — E669 Obesity, unspecified: Secondary | ICD-10-CM

## 2013-11-14 DIAGNOSIS — N419 Inflammatory disease of prostate, unspecified: Secondary | ICD-10-CM

## 2013-11-14 DIAGNOSIS — Z0000 Encounter for general adult medical examination without abnormal findings: Secondary | ICD-10-CM

## 2013-11-14 MED ORDER — SILDENAFIL CITRATE 100 MG PO TABS: 50.0000 mg | ORAL_TABLET | Freq: Every day | ORAL | Status: DC | PRN

## 2013-11-14 MED ORDER — LISINOPRIL 5 MG PO TABS: 5.0000 mg | ORAL_TABLET | Freq: Every day | ORAL | Status: DC

## 2013-11-14 NOTE — Assessment & Plan Note (Signed)
Routine anticipatory guidance given to patient. See health maintenance.  Tetanus 2008  Flu shot done this year, at work.  PNA shot today  Colonoscopy 2010  PSA not elevated d/w pt. H/o prostatitis noted.  Living will d/w pt. Wife would be designated if incapacitated.  Diet and exercise. His exercise was limited by knee pain. Some better in the meantime. Discussed diet. Weight is up.

## 2013-11-14 NOTE — Assessment & Plan Note (Signed)
Controlled out of office, continue as is.  D/w pt about weight loss.

## 2013-11-14 NOTE — Assessment & Plan Note (Signed)
D/w pt about labs, weight loss.  He agrees.

## 2013-11-14 NOTE — Progress Notes (Signed)
Pre-visit discussion using our clinic review tool. No additional management support is needed unless otherwise documented below in the visit note.  CPE- See plan.  Routine anticipatory guidance given to patient.  See health maintenance. Tetanus 2008 Flu shot done this year, at work.  PNA shot today Colonoscopy 2010 PSA not elevated d/w pt.  H/o prostatitis noted.  Living will d/w pt.  Wife would be designated if incapacitated.  Diet and exercise.  His exercise was limited by knee pain. Some better in the meantime. Discussed diet.  Weight is up.      Diabetes:  No meds Hypoglycemic episodes: no sx Hyperglycemic episodes:no sx  Feet problems: no Blood Sugars averaging:not checked.  eye exam within last year: due in 12/14, pending Labs d/w pt.    Hypertension:    Using medication without problems or lightheadedness: yes Chest pain with exertion:no Edema:no Short of breath:no Checked BP at home. Usually controlled at home, lower than today.  Usually 130s/80s, occ lower.  He admits to white coat component.   PMH and SH reviewed.   Vital signs, Meds and allergies reviewed.  ROS: See HPI.  Otherwise nontributory.   GEN: nad, alert and oriented HEENT: mucous membranes moist NECK: supple w/o LA CV: rrr PULM: ctab, no inc wob ABD: soft, +bs EXT: no edema SKIN: no acute rash  Diabetic foot exam: Normal inspection No skin breakdown No calluses  Normal DP pulses Normal sensation to light tough and monofilament Nails normal

## 2013-11-14 NOTE — Assessment & Plan Note (Signed)
Sx resolved, we can follow his PSA episodically if needed.  DRE deferred.

## 2013-11-14 NOTE — Patient Instructions (Addendum)
Recheck A1c before a visit in about 4 months.   Work on [employer] and weight.  Try to ease back into exercise.   Take care. Glad to see you.

## 2013-11-14 NOTE — Addendum Note (Signed)
Addended by: Annamarie Major on: 11/14/2013 10:02 AM   Modules accepted: Orders

## 2013-11-14 NOTE — Assessment & Plan Note (Signed)
D/w pt about weight loss.  Labs d/w pt.  He'll work on diet/exercise.   Recheck A1c in about 4 months.  He is going to f/u with eye clinic in the near future.

## 2014-02-03 ENCOUNTER — Encounter: Payer: Self-pay | Admitting: Gastroenterology

## 2014-02-15 ENCOUNTER — Encounter: Payer: Self-pay | Admitting: Gastroenterology

## 2014-03-13 ENCOUNTER — Other Ambulatory Visit (INDEPENDENT_AMBULATORY_CARE_PROVIDER_SITE_OTHER): Payer: BC Managed Care – PPO

## 2014-03-13 DIAGNOSIS — E119 Type 2 diabetes mellitus without complications: Secondary | ICD-10-CM

## 2014-03-13 LAB — HEMOGLOBIN A1C: Hgb A1c MFr Bld: 8.1 % — ABNORMAL HIGH (ref 4.6–6.5)

## 2014-03-16 ENCOUNTER — Ambulatory Visit (INDEPENDENT_AMBULATORY_CARE_PROVIDER_SITE_OTHER): Payer: BC Managed Care – PPO | Admitting: Family Medicine

## 2014-03-16 ENCOUNTER — Encounter: Payer: Self-pay | Admitting: Family Medicine

## 2014-03-16 VITALS — BP 146/100 | HR 83 | Temp 98.5°F | Wt 334.5 lb

## 2014-03-16 DIAGNOSIS — E669 Obesity, unspecified: Secondary | ICD-10-CM

## 2014-03-16 DIAGNOSIS — E1169 Type 2 diabetes mellitus with other specified complication: Secondary | ICD-10-CM

## 2014-03-16 DIAGNOSIS — E119 Type 2 diabetes mellitus without complications: Secondary | ICD-10-CM

## 2014-03-16 MED ORDER — METFORMIN HCL 500 MG PO TABS: 500.0000 mg | ORAL_TABLET | Freq: Two times a day (BID) | ORAL | Status: DC

## 2014-03-16 MED ORDER — LISINOPRIL 5 MG PO TABS: 5.0000 mg | ORAL_TABLET | Freq: Every day | ORAL | Status: DC

## 2014-03-16 NOTE — Assessment & Plan Note (Signed)
Needs weight loss more than anything.  D/w pt.  Add on metformin in the meantime.  Would expect BP to improve with weight loss.  See instructions.  He agrees.  Routine cautions given on metformin.

## 2014-03-16 NOTE — Patient Instructions (Signed)
Start with 1 metformin a day for about 1 week.  If tolerated, then take it twice a day.  If you don't tolerate it, then notify me.  Recheck A1c in about 3 months before a visit.  Work on your [employer] in the meantime.

## 2014-03-16 NOTE — Progress Notes (Signed)
Pre visit review using our clinic review tool, if applicable. No additional management support is needed unless otherwise documented below in the visit note.  Diabetes:  No meds Hypoglycemic episodes:no sx Hyperglycemic episodes: no sx  Feet problems: no Blood Sugars averaging:not checked eye exam within last year:f/u pending.   A1c up.  D/w pt.  Weight up, d/w pt.    PMH and SH reviewed  Meds, vitals, and allergies reviewed.   ROS: See HPI.  Otherwise negative.    GEN: nad, alert and oriented HEENT: mucous membranes moist NECK: supple w/o LA CV: rrr. PULM: ctab, no inc wob ABD: soft, +bs EXT: no edema SKIN: no acute rash  Diabetic foot exam: Normal inspection No skin breakdown No calluses  Normal DP pulses Normal sensation to light touch and monofilament Nails normal

## 2014-03-31 ENCOUNTER — Ambulatory Visit (AMBULATORY_SURGERY_CENTER): Payer: Self-pay | Admitting: *Deleted

## 2014-03-31 VITALS — Ht 70.0 in | Wt 334.5 lb

## 2014-03-31 DIAGNOSIS — Z860100 Personal history of colon polyps, unspecified: Secondary | ICD-10-CM

## 2014-03-31 MED ORDER — PREPOPIK 10-3.5-12 MG-GM-GM PO PACK: 1.0000 | PACK | Freq: Once | ORAL | Status: DC

## 2014-03-31 NOTE — Progress Notes (Signed)
No allergies to eggs or soy. No problems with anesthesia.  Pt given Emmi instructions for colonoscopy  No oxygen use  No diet drug use  

## 2014-04-05 ENCOUNTER — Encounter: Payer: Self-pay | Admitting: Gastroenterology

## 2014-04-07 ENCOUNTER — Telehealth: Payer: Self-pay

## 2014-04-07 NOTE — Telephone Encounter (Signed)
Relevant patient education mailed to patient.  

## 2014-04-14 ENCOUNTER — Encounter: Payer: Self-pay | Admitting: Gastroenterology

## 2014-04-14 ENCOUNTER — Ambulatory Visit (AMBULATORY_SURGERY_CENTER): Payer: BC Managed Care – PPO | Admitting: Gastroenterology

## 2014-04-14 VITALS — BP 118/81 | HR 69 | Temp 98.0°F | Resp 13 | Ht 70.0 in | Wt 334.0 lb

## 2014-04-14 DIAGNOSIS — Z860100 Personal history of colon polyps, unspecified: Secondary | ICD-10-CM

## 2014-04-14 DIAGNOSIS — D126 Benign neoplasm of colon, unspecified: Secondary | ICD-10-CM

## 2014-04-14 MED ORDER — SODIUM CHLORIDE 0.9 % IV SOLN: 500.0000 mL | INTRAVENOUS | Status: DC

## 2014-04-14 NOTE — Patient Instructions (Signed)
YOU HAD AN ENDOSCOPIC PROCEDURE TODAY AT THE [facility]: Refer to the procedure report that was given to you for any specific questions about what was found during the examination.  If the procedure report does not answer your questions, please call your gastroenterologist to clarify.  If you requested that your care partner not be given the details of your procedure findings, then the procedure report has been included in a sealed envelope for you to review at your convenience later.  YOU SHOULD EXPECT: Some feelings of bloating in the abdomen. Passage of more gas than usual.  Walking can help get rid of the air that was put into your GI tract during the procedure and reduce the bloating. If you had a lower endoscopy (such as a colonoscopy or flexible sigmoidoscopy) you may notice spotting of blood in your stool or on the toilet paper. If you underwent a bowel prep for your procedure, then you may not have a normal bowel movement for a few days.  DIET: Your first meal following the procedure should be a light meal and then it is ok to progress to your normal diet.  A half-sandwich or bowl of soup is an example of a good first meal.  Heavy or fried foods are harder to digest and may make you feel nauseous or bloated.  Likewise meals heavy in dairy and vegetables can cause extra gas to form and this can also increase the bloating.  Drink plenty of fluids but you should avoid alcoholic beverages for 24 hours.  ACTIVITY: Your care partner should take you home directly after the procedure.  You should plan to take it easy, moving slowly for the rest of the day.  You can resume normal activity the day after the procedure however you should NOT DRIVE or use heavy machinery for 24 hours (because of the sedation medicines used during the test).    SYMPTOMS TO REPORT IMMEDIATELY: A gastroenterologist can be reached at any hour.  During normal business hours, 8:30 AM to 5:00 PM Monday through Friday,  call [phone].  After hours and on weekends, please call the GI answering service at [phone] who will take a message and have the physician on call contact you.   Following lower endoscopy (colonoscopy or flexible sigmoidoscopy):  Excessive amounts of blood in the stool  Significant tenderness or worsening of abdominal pains  Swelling of the abdomen that is new, acute  Fever of 100F or higher    FOLLOW UP: If any biopsies were taken you will be contacted by phone or by letter within the next 1-3 weeks.  Call your gastroenterologist if you have not heard about the biopsies in 3 weeks.  Our staff will call the home number listed on your records the next business day following your procedure to check on you and address any questions or concerns that you may have at that time regarding the information given to you following your procedure. This is a courtesy call and so if there is no answer at the home number and we have not heard from you through the emergency physician on call, we will assume that you have returned to your regular daily activities without incident.  SIGNATURES/CONFIDENTIALITY: You and/or your care partner have signed paperwork which will be entered into your electronic medical record.  These signatures attest to the fact that that the information above on your After Visit Summary has been reviewed and is understood.  Full responsibility of the confidentiality   of this discharge information lies with you and/or your care-partner.     

## 2014-04-14 NOTE — Op Note (Signed)
[place]  [address] [place], [id number]   COLONOSCOPY PROCEDURE REPORT  PATIENT: Ayuub, Penley  MR#: [id number] BIRTHDATE: Apr 10, 1956 , 76  yrs. old GENDER: Male ENDOSCOPIST: Ladene Artist, MD, [facility] PROCEDURE DATE:  04/14/2014 PROCEDURE:   Colonoscopy with snare polypectomy First Screening Colonoscopy - Avg.  risk and is 50 yrs.  old or older - No.  Prior Negative Screening - Now for repeat screening. N/A  History of Adenoma - Now for follow-up colonoscopy & has been > or = to 3 yrs.  Yes hx of adenoma.  Has been 3 or more years since last colonoscopy.  Polyps Removed Today? Yes. ASA CLASS:   Class II INDICATIONS:Patient's personal history of adenomatous colon polyps.  MEDICATIONS: MAC sedation, administered by CRNA and propofol (Diprivan) 300mg  IV DESCRIPTION OF PROCEDURE:   After the risks benefits and alternatives of the procedure were thoroughly explained, informed consent was obtained.  A digital rectal exam revealed no abnormalities of the rectum.   The [id number]  endoscope was introduced through the anus and advanced to the cecum, which was identified by both the appendix and ileocecal valve. No adverse events experienced.   The quality of the prep was [employer] The instrument was then slowly withdrawn as the colon was fully examined.  COLON FINDINGS: A sessile polyp measuring 5 mm in size was found in the transverse colon.  A polypectomy was performed with a cold snare.  The resection was complete and the polyp tissue was completely retrieved.   The colon was otherwise normal.  There was no diverticulosis, inflammation, polyps or cancers unless previously stated.  Retroflexed views revealed small internal hemorrhoids. The time to cecum=3 minutes 11 seconds.  Withdrawal time=12 minutes 00 seconds.  The scope was withdrawn and the procedure completed.  COMPLICATIONS: There were no complications.  ENDOSCOPIC IMPRESSION: 1.    Sessile polyp measuring 5 mm in the transverse colon; polypectomy performed with a cold snare 2.   Small internal  hemorrhoids  RECOMMENDATIONS: 1.  Await pathology results 2.  Repeat Colonoscopy in 5 years with more extensive bowel prep  eSigned:  Ladene Artist, MD, [facility] 04/14/2014 11:31 AM

## 2014-04-14 NOTE — Progress Notes (Signed)
Procedure ends, to recovery, report given and VSS. 

## 2014-04-14 NOTE — Progress Notes (Signed)
Called to room to assist during endoscopic procedure.  Patient ID and intended procedure confirmed with present staff. Received instructions for my participation in the procedure from the performing physician.  

## 2014-04-17 ENCOUNTER — Telehealth: Payer: Self-pay | Admitting: *Deleted

## 2014-04-17 NOTE — Telephone Encounter (Signed)
  Follow up Call-  Call back number 04/14/2014  Post procedure Call Back phone  # [phone]  Permission to leave phone message Yes     Patient questions:  Do you have a fever, pain , or abdominal swelling? no Pain Score  0 *  Have you tolerated food without any problems? yes  Have you been able to return to your normal activities? yes  Do you have any questions about your discharge instructions: Diet   no Medications  no Follow up visit  no  Do you have questions or concerns about your Care? no  Actions: * If pain score is 4 or above: No action needed, pain <4.

## 2014-04-18 ENCOUNTER — Encounter: Payer: Self-pay | Admitting: Gastroenterology

## 2014-06-05 ENCOUNTER — Other Ambulatory Visit (INDEPENDENT_AMBULATORY_CARE_PROVIDER_SITE_OTHER): Payer: BC Managed Care – PPO

## 2014-06-05 DIAGNOSIS — E119 Type 2 diabetes mellitus without complications: Secondary | ICD-10-CM

## 2014-06-05 LAB — HEMOGLOBIN A1C: Hgb A1c MFr Bld: 7.8 % — ABNORMAL HIGH (ref 4.6–6.5)

## 2014-06-09 ENCOUNTER — Ambulatory Visit (INDEPENDENT_AMBULATORY_CARE_PROVIDER_SITE_OTHER): Payer: BC Managed Care – PPO | Admitting: Family Medicine

## 2014-06-09 ENCOUNTER — Encounter: Payer: Self-pay | Admitting: Family Medicine

## 2014-06-09 VITALS — BP 134/84 | HR 83 | Temp 98.4°F | Wt 322.5 lb

## 2014-06-09 DIAGNOSIS — E1169 Type 2 diabetes mellitus with other specified complication: Secondary | ICD-10-CM

## 2014-06-09 DIAGNOSIS — E119 Type 2 diabetes mellitus without complications: Secondary | ICD-10-CM

## 2014-06-09 DIAGNOSIS — E669 Obesity, unspecified: Secondary | ICD-10-CM

## 2014-06-09 NOTE — Progress Notes (Signed)
Pre visit review using our clinic review tool, if applicable. No additional management support is needed unless otherwise documented below in the visit note.  Diabetes:  Using medications without difficulties: not on meds yet.   Hypoglycemic episodes:no sx Hyperglycemic episodes:no sx Feet problems: no Blood Sugars averaging: not checked yet eye exam within last year: due, he'll call about that.   Weight is intentionally down.  Working on diet and exercise.   He is trying to spread his calories around, cutting back on carbs.    Allergy sx after working the yard.  Prev on allegra.  Changed to zyrtec recently.    Meds, vitals, and allergies reviewed.   ROS: See HPI.  Otherwise negative.    GEN: nad, alert and oriented HEENT: mucous membranes moist NECK: supple w/o LA CV: rrr. PULM: ctab, no inc wob ABD: soft, +bs EXT: no edema SKIN: no acute rash  Diabetic foot exam: Normal inspection No skin breakdown No calluses  Normal DP pulses Normal sensation to light touch and monofilament Nails normal

## 2014-06-09 NOTE — Patient Instructions (Addendum)
Get a pollen mask and try the zyrtec for now.  Use nasal saline after cutting grass.  Keep working on your weight in the meantime.  Recheck labs in about 4 months before a visit.   Take care.  Stay off the metformin in the meantime.   Glad to see you.

## 2014-06-11 NOTE — Assessment & Plan Note (Signed)
Not yet on metformin.  Working to lose weight.  Continue off meds for now.  Recheck A1c in a few months.  We can address potential meds at that point.  D/w pt about diet and exercise.  He agrees.

## 2014-10-01 ENCOUNTER — Other Ambulatory Visit: Payer: Self-pay | Admitting: Family Medicine

## 2014-10-01 DIAGNOSIS — E669 Obesity, unspecified: Principal | ICD-10-CM

## 2014-10-01 DIAGNOSIS — E1169 Type 2 diabetes mellitus with other specified complication: Secondary | ICD-10-CM

## 2014-10-03 ENCOUNTER — Other Ambulatory Visit (INDEPENDENT_AMBULATORY_CARE_PROVIDER_SITE_OTHER): Payer: BC Managed Care – PPO

## 2014-10-03 DIAGNOSIS — E119 Type 2 diabetes mellitus without complications: Secondary | ICD-10-CM

## 2014-10-03 DIAGNOSIS — E669 Obesity, unspecified: Secondary | ICD-10-CM

## 2014-10-03 DIAGNOSIS — E7800 Pure hypercholesterolemia, unspecified: Secondary | ICD-10-CM

## 2014-10-03 DIAGNOSIS — E1169 Type 2 diabetes mellitus with other specified complication: Secondary | ICD-10-CM

## 2014-10-03 DIAGNOSIS — I10 Essential (primary) hypertension: Secondary | ICD-10-CM

## 2014-10-03 LAB — LIPID PANEL
Cholesterol: 200 mg/dL (ref 0–200)
HDL: 38.8 mg/dL — ABNORMAL LOW (ref >39.00)
LDL CALC: 144 mg/dL — AB (ref 0–99)
NonHDL: 161.2
TRIGLYCERIDES: 87 mg/dL (ref 0.0–149.0)
Total CHOL/HDL Ratio: 5
VLDL: 17.4 mg/dL (ref 0.0–40.0)

## 2014-10-03 LAB — HEMOGLOBIN A1C: HEMOGLOBIN A1C: 7.1 % — AB (ref 4.6–6.5)

## 2014-10-09 ENCOUNTER — Ambulatory Visit (INDEPENDENT_AMBULATORY_CARE_PROVIDER_SITE_OTHER): Payer: BC Managed Care – PPO | Admitting: Family Medicine

## 2014-10-09 ENCOUNTER — Encounter: Payer: Self-pay | Admitting: Family Medicine

## 2014-10-09 VITALS — BP 128/82 | HR 82 | Temp 98.4°F | Wt 316.2 lb

## 2014-10-09 DIAGNOSIS — G4730 Sleep apnea, unspecified: Secondary | ICD-10-CM

## 2014-10-09 MED ORDER — SILDENAFIL CITRATE 100 MG PO TABS: 50.0000 mg | ORAL_TABLET | Freq: Every day | ORAL | Status: DC | PRN

## 2014-10-09 MED ORDER — LISINOPRIL 5 MG PO TABS: 5.0000 mg | ORAL_TABLET | Freq: Every day | ORAL | Status: DC

## 2014-10-09 NOTE — Patient Instructions (Addendum)
Call about an eye exam.   Brett Stanley will call about your referral. Keep working on your weight.  Recheck at a physical in 02/2015.  Labs ahead of time.  Glad to see you.  Take care.

## 2014-10-09 NOTE — Progress Notes (Signed)
Pre visit review using our clinic review tool, if applicable. No additional management support is needed unless otherwise documented below in the visit note.  Diabetes:  No meds.   Intentional weight loss, cutting back on snacks.   A1c improved, d/w pt.  Now 7.1.  Hypoglycemic episodes: no sx Hyperglycemic episodes: no sx Feet problems: no Blood Sugars averaging: not checked eye exam within last year: due, d/w pt.    Wife noted periods of apnea.  Loud snoring.  Wife has to sleep in another room, another floor.  She has heard him wake gasping for air.  Not fatigued.    He is going to f/u with workers comp re: his R knee.  Walking for exercise.    Meds, vitals, and allergies reviewed.   ROS: See HPI.  Otherwise negative.    GEN: nad, alert and oriented HEENT: mucous membranes moist NECK: supple w/o LA CV: rrr. PULM: ctab, no inc wob ABD: soft, +bs EXT: no edema SKIN: no acute rash  Diabetic foot exam: Normal inspection No skin breakdown Calluses noted Normal DP pulses Normal sensation to light touch and monofilament Nails normal

## 2014-10-12 ENCOUNTER — Ambulatory Visit (INDEPENDENT_AMBULATORY_CARE_PROVIDER_SITE_OTHER): Payer: BC Managed Care – PPO | Admitting: Pulmonary Disease

## 2014-10-12 ENCOUNTER — Encounter: Payer: Self-pay | Admitting: Pulmonary Disease

## 2014-10-12 VITALS — BP 152/94 | HR 78 | Temp 98.4°F | Ht 70.0 in | Wt 324.4 lb

## 2014-10-12 DIAGNOSIS — R0683 Snoring: Secondary | ICD-10-CM | POA: Insufficient documentation

## 2014-10-12 NOTE — Progress Notes (Signed)
Chief Complaint  Patient presents with  . SLEEP CONSULT    Referred by Dr Damita Dunnings. Epworth Score: 12    History of Present Illness: Brett Stanley is a 58 y.o. male for evaluation of sleep problems.  His sister works as a Marine scientist, and has sleep apnea.  She told him over the summer that he is a loud snorer and stops breathing while asleep.  His wife has to sleep in a separate room because of his snoring.  He was seen recently by his PCP and advised to have further sleep assessment.  He goes to sleep at 1030 pm.  He falls asleep after 5 minutes.  He wakes up 1 or 2 times to use the bathroom.  He gets out of bed between 5 and 6 am, depending on his work schedule.  He feels okay in the morning.  He denies morning headache.  He does not use anything to help him fall sleep or stay awake.  He gets legs cramps at night about once or twice per month.  He denies sleep walking, sleep talking, bruxism, or nightmares.  There is no history of restless legs.  He denies sleep hallucinations, sleep paralysis, or cataplexy.  The Epworth score is 12 out of 24.  Brett Stanley  has a past medical history of Allergy; HTN (hypertension); and Type II or unspecified type diabetes mellitus without mention of complication, not stated as uncontrolled.  Brett Stanley  has past surgical history that includes Vasectomy (1997) and Knee arthroscopy (Right, 2014).  Prior to Admission medications   Medication Sig Start Date End Date Taking? Authorizing Provider  cetirizine (ZYRTEC) 10 MG tablet Take 10 mg by mouth daily as needed.    Yes Historical Provider, MD  lisinopril (PRINIVIL,ZESTRIL) 5 MG tablet Take 1 tablet (5 mg total) by mouth daily. 10/09/14  Yes Tonia Ghent, MD  sildenafil (VIAGRA) 100 MG tablet Take 0.5-1 tablets (50-100 mg total) by mouth daily as needed for erectile dysfunction. 10/09/14  Yes Tonia Ghent, MD  Multiple Vitamin (MULTIVITAMIN) tablet Take 1 tablet by mouth daily.    Historical Provider, MD     Allergies  Allergen Reactions  . Erythromycin Nausea Only  . Oxycodone     nausea    His family history includes Alzheimer's disease in his mother; Cancer in his other; Depression in his maternal uncle; Diabetes in his maternal uncle and mother; Hypertension in his other; Sleep apnea in his sister; Stroke in his maternal grandfather. There is no history of Colon cancer or Prostate cancer.  He  reports that he has never smoked. He has never used smokeless tobacco. He reports that he drinks alcohol. He reports that he does not use illicit drugs.  Review of Systems  Constitutional: Negative for fever and unexpected weight change.  HENT: Negative for congestion, dental problem, ear pain, nosebleeds, postnasal drip, rhinorrhea, sinus pressure, sneezing, sore throat and trouble swallowing.        Seasonal allergies(Spring & Fall)  Eyes: Negative for redness and itching.  Respiratory: Negative for cough, chest tightness, shortness of breath and wheezing.   Cardiovascular: Negative for palpitations and leg swelling.  Gastrointestinal: Negative for nausea and vomiting.  Genitourinary: Negative for dysuria.  Musculoskeletal: Negative for joint swelling.  Skin: Negative for rash.  Neurological: Negative for headaches.  Hematological: Does not bruise/bleed easily.  Psychiatric/Behavioral: Negative for dysphoric mood. The patient is not nervous/anxious.    Physical Exam:  General - No distress ENT -  No sinus tenderness, no oral exudate, no LAN, no thyromegaly, TM clear, pupils equal/reactive, enlarged tongue, 2+ tonsils Cardiac - s1s2 regular, no murmur, pulses symmetric Chest - No wheeze/rales/dullness, good air entry, normal respiratory excursion Back - No focal tenderness Abd - Soft, non-tender, no organomegaly, + bowel sounds Ext - No edema Neuro - Normal strength, cranial nerves intact Skin - No rashes Psych - Normal mood, and behavior  Assessment/plan:  Chesley Mires,  M.D. Pager [phone]

## 2014-10-12 NOTE — Assessment & Plan Note (Signed)
He has snoring, sleep disruption, witnessed apnea, and daytime sleepiness.  He has hx of HTN and DM.  He has family hx of OSA.  I am concerned he also has OSA.  His BMI is > 35.  We discussed how sleep apnea can affect various health problems including risks for hypertension, cardiovascular disease, and diabetes.  We also discussed how sleep disruption can increase risks for accident, such as while driving.  Weight loss as a means of improving sleep apnea was also reviewed.  Additional treatment options discussed were CPAP therapy, oral appliance, and surgical intervention.  Will arrange for home sleep study to further assess, pending insurance approval.

## 2014-10-12 NOTE — Patient Instructions (Signed)
Will arrange for home sleep study Will call to arrange for follow up after sleep study reviewed  

## 2014-10-12 NOTE — Progress Notes (Deleted)
   Subjective:    Patient ID: Brett Stanley, male    DOB: 10-24-1956, 58 y.o.   MRN: [id number]  HPI    Review of Systems  Constitutional: Negative for fever and unexpected weight change.  HENT: Negative for congestion, dental problem, ear pain, nosebleeds, postnasal drip, rhinorrhea, sinus pressure, sneezing, sore throat and trouble swallowing.        Seasonal allergies(Spring & Fall)  Eyes: Negative for redness and itching.  Respiratory: Negative for cough, chest tightness, shortness of breath and wheezing.   Cardiovascular: Negative for palpitations and leg swelling.  Gastrointestinal: Negative for nausea and vomiting.  Genitourinary: Negative for dysuria.  Musculoskeletal: Negative for joint swelling.  Skin: Negative for rash.  Neurological: Negative for headaches.  Hematological: Does not bruise/bleed easily.  Psychiatric/Behavioral: Negative for dysphoric mood. The patient is not nervous/anxious.        Objective:   Physical Exam        Assessment & Plan:

## 2015-01-16 DIAGNOSIS — G4730 Sleep apnea, unspecified: Secondary | ICD-10-CM

## 2015-01-23 ENCOUNTER — Telehealth: Payer: Self-pay | Admitting: Pulmonary Disease

## 2015-01-23 DIAGNOSIS — G4739 Other sleep apnea: Secondary | ICD-10-CM | POA: Diagnosis not present

## 2015-01-23 DIAGNOSIS — G4730 Sleep apnea, unspecified: Secondary | ICD-10-CM | POA: Diagnosis not present

## 2015-01-23 DIAGNOSIS — G4733 Obstructive sleep apnea (adult) (pediatric): Secondary | ICD-10-CM

## 2015-01-23 NOTE — Telephone Encounter (Signed)
HST 01/16/15 >> AHI 59.5, SaO2 low 73%.  Will have my nurse inform pt that sleep study shows severe sleep apnea.  Options are 1) CPAP now or 2) ROV now.  If he is okay with CPAP, then please send order for auto CPAP with range 5 to 15 cm H2O with heated humidity and mask of choice.  Have download sent 1 month after starting CPAP and schedule ROV 2 months after starting CPAP.

## 2015-01-25 ENCOUNTER — Encounter: Payer: Self-pay | Admitting: Pulmonary Disease

## 2015-01-29 NOTE — Telephone Encounter (Signed)
Pt aware of results. CPAP order placed. 2 month recall entered. Nothing further needed.

## 2015-02-08 ENCOUNTER — Other Ambulatory Visit: Payer: Self-pay | Admitting: Family Medicine

## 2015-02-08 DIAGNOSIS — E669 Obesity, unspecified: Principal | ICD-10-CM

## 2015-02-08 DIAGNOSIS — E1169 Type 2 diabetes mellitus with other specified complication: Secondary | ICD-10-CM

## 2015-02-15 ENCOUNTER — Other Ambulatory Visit (INDEPENDENT_AMBULATORY_CARE_PROVIDER_SITE_OTHER): Payer: BC Managed Care – PPO

## 2015-02-15 DIAGNOSIS — E669 Obesity, unspecified: Secondary | ICD-10-CM

## 2015-02-15 DIAGNOSIS — E119 Type 2 diabetes mellitus without complications: Secondary | ICD-10-CM

## 2015-02-15 DIAGNOSIS — E1169 Type 2 diabetes mellitus with other specified complication: Secondary | ICD-10-CM

## 2015-02-15 LAB — LIPID PANEL
CHOL/HDL RATIO: 4
Cholesterol: 203 mg/dL — ABNORMAL HIGH (ref 0–200)
HDL: 49.8 mg/dL (ref >39.00)
LDL Cholesterol: 136 mg/dL — ABNORMAL HIGH (ref 0–99)
NonHDL: 153.2
Triglycerides: 87 mg/dL (ref 0.0–149.0)
VLDL: 17.4 mg/dL (ref 0.0–40.0)

## 2015-02-15 LAB — COMPREHENSIVE METABOLIC PANEL
ALBUMIN: 3.9 g/dL (ref 3.5–5.2)
ALT: 15 U/L (ref 0–53)
AST: 18 U/L (ref 0–37)
Alkaline Phosphatase: 51 U/L (ref 39–117)
BUN: 16 mg/dL (ref 6–23)
CO2: 24 meq/L (ref 19–32)
CREATININE: 0.88 mg/dL (ref 0.40–1.50)
Calcium: 8.9 mg/dL (ref 8.4–10.5)
Chloride: 104 mEq/L (ref 96–112)
GFR: 94.73 mL/min (ref >60.00)
Glucose, Bld: 147 mg/dL — ABNORMAL HIGH (ref 70–99)
POTASSIUM: 4 meq/L (ref 3.5–5.1)
Sodium: 136 mEq/L (ref 135–145)
Total Bilirubin: 0.7 mg/dL (ref 0.2–1.2)
Total Protein: 7.4 g/dL (ref 6.0–8.3)

## 2015-02-15 LAB — HEMOGLOBIN A1C: HEMOGLOBIN A1C: 7.4 % — AB (ref 4.6–6.5)

## 2015-02-22 ENCOUNTER — Encounter: Payer: BC Managed Care – PPO | Admitting: Family Medicine

## 2015-02-26 ENCOUNTER — Encounter: Payer: Self-pay | Admitting: Family Medicine

## 2015-02-26 ENCOUNTER — Ambulatory Visit (INDEPENDENT_AMBULATORY_CARE_PROVIDER_SITE_OTHER): Payer: BC Managed Care – PPO | Admitting: Family Medicine

## 2015-02-26 VITALS — BP 124/88 | HR 83 | Temp 98.8°F | Ht 69.5 in | Wt 319.2 lb

## 2015-02-26 DIAGNOSIS — R0683 Snoring: Secondary | ICD-10-CM

## 2015-02-26 DIAGNOSIS — E1169 Type 2 diabetes mellitus with other specified complication: Secondary | ICD-10-CM

## 2015-02-26 DIAGNOSIS — Z7189 Other specified counseling: Secondary | ICD-10-CM

## 2015-02-26 DIAGNOSIS — E669 Obesity, unspecified: Secondary | ICD-10-CM

## 2015-02-26 DIAGNOSIS — Z0000 Encounter for general adult medical examination without abnormal findings: Secondary | ICD-10-CM

## 2015-02-26 MED ORDER — LISINOPRIL 5 MG PO TABS: 5.0000 mg | ORAL_TABLET | Freq: Every day | ORAL | Status: DC

## 2015-02-26 NOTE — Assessment & Plan Note (Signed)
Routine anticipatory guidance given to patient.  See health maintenance. Tetanus 2008 Flu 2015 PNA 2014 Shingles due at 53.   Colonoscopy 2015 Prostate cancer screening and PSA options (with potential risks and benefits of testing vs not testing) were discussed along with recent recs/guidelines.  He declined testing PSA at this point. Encouraged eye clinic f/u.  Due, d/w pt.   Living will d/w pt.  Wife designated if patient were incapacitated.

## 2015-02-26 NOTE — Patient Instructions (Signed)
Work on your [employer] in the meantime.   Recheck A1c before a visit in about 3-4 months.  Take care.  Glad to see you.

## 2015-02-26 NOTE — Assessment & Plan Note (Signed)
A1c up slightly, on ACE, no DM2 meds.   D/w pt.  Needs weight loss.  Recheck in 3-4 months.  He agrees.  He is trying to avoid meds.   Lipids not at goal, again needs weight loss.  D/w pt.   The A1c is the first target.  Will d/w pt again at next OV, after next A1c.   He agrees.  Discussed diet and exercise.

## 2015-02-26 NOTE — Progress Notes (Signed)
Pre visit review using our clinic review tool, if applicable. No additional management support is needed unless otherwise documented below in the visit note.  CPE- See plan.  Routine anticipatory guidance given to patient.  See health maintenance. Tetanus 2008 Flu 2015 PNA 2014 Shingles due at 63.   Colonoscopy 2015 Prostate cancer screening and PSA options (with potential risks and benefits of testing vs not testing) were discussed along with recent recs/guidelines.  He declined testing PSA at this point. Encouraged eye clinic f/u.  Due, d/w pt.   Living will d/w pt.  Wife designated if patient were incapacitated.    Diabetes:  No meds.   Hypoglycemic episodes: no sx Hyperglycemic episodes: no sx Feet problems:no Blood Sugars averaging: checked rarely eye exam within last year: due, see above A1c up some, d/w pt.  Has weight gain noted.  D/w pt about diet and exercise.  Exercise recently limited.    He has seen Dr. Halford Chessman about OSA.  He has the kit at home to use.  I asked him to call pulmonary.  He may have trouble with mask fit.    PMH and SH reviewed  Meds, vitals, and allergies reviewed.   ROS: See HPI.  Otherwise negative.    GEN: nad, alert and oriented HEENT: mucous membranes moist NECK: supple w/o LA CV: rrr. PULM: ctab, no inc wob ABD: soft, +bs EXT: no edema SKIN: no acute rash  Diabetic foot exam: Normal inspection No skin breakdown Calluses noted on balls of the feet Normal DP pulses Normal sensation to light touch and monofilament Nails normal

## 2015-02-26 NOTE — Assessment & Plan Note (Signed)
He can call pulmonary about mask options.  He agrees.

## 2015-05-30 ENCOUNTER — Other Ambulatory Visit: Payer: Self-pay | Admitting: Family Medicine

## 2015-05-30 DIAGNOSIS — E119 Type 2 diabetes mellitus without complications: Secondary | ICD-10-CM

## 2015-06-07 ENCOUNTER — Other Ambulatory Visit (INDEPENDENT_AMBULATORY_CARE_PROVIDER_SITE_OTHER): Payer: BC Managed Care – PPO

## 2015-06-07 DIAGNOSIS — E119 Type 2 diabetes mellitus without complications: Secondary | ICD-10-CM | POA: Diagnosis not present

## 2015-06-07 LAB — HEMOGLOBIN A1C: Hgb A1c MFr Bld: 7.1 % — ABNORMAL HIGH (ref 4.6–6.5)

## 2015-06-11 ENCOUNTER — Encounter: Payer: Self-pay | Admitting: Family Medicine

## 2015-06-11 ENCOUNTER — Ambulatory Visit (INDEPENDENT_AMBULATORY_CARE_PROVIDER_SITE_OTHER): Payer: BC Managed Care – PPO | Admitting: Family Medicine

## 2015-06-11 VITALS — BP 130/88 | HR 74 | Temp 98.6°F | Wt 320.5 lb

## 2015-06-11 DIAGNOSIS — E1169 Type 2 diabetes mellitus with other specified complication: Secondary | ICD-10-CM

## 2015-06-11 DIAGNOSIS — E669 Obesity, unspecified: Secondary | ICD-10-CM | POA: Diagnosis not present

## 2015-06-11 DIAGNOSIS — E119 Type 2 diabetes mellitus without complications: Secondary | ICD-10-CM

## 2015-06-11 NOTE — Patient Instructions (Addendum)
Call about an eye exam.   Recheck labs in about 4 months before a visit.  Keep working on your weight.  Take care.  Glad to see you.

## 2015-06-11 NOTE — Assessment & Plan Note (Signed)
A1c better, not to goal.  D/w pt.  No med changes, just work on weight- d/w pt.  Recheck in a few months.  He agrees.

## 2015-06-11 NOTE — Progress Notes (Signed)
Pre visit review using our clinic review tool, if applicable. No additional management support is needed unless otherwise documented below in the visit note.  Diabetes:  No meds Hypoglycemic episodes: no Hyperglycemic episodes: no Feet problems: no Blood Sugars averaging: checked intermittently, "good" per patient eye exam within last year: due, d/w pt.   He is working on his weight but his is complicated by caring for his father who is chronically ill.   Weight loss d/w pt, re: diet and exercise.    Meds, vitals, and allergies reviewed.   ROS: See HPI.  Otherwise negative.    GEN: nad, alert and oriented HEENT: mucous membranes moist NECK: supple w/o LA CV: rrr. PULM: ctab, no inc wob ABD: soft, +bs EXT: no edema  Diabetic foot exam: Normal inspection No skin breakdown No calluses  Normal DP pulses Normal sensation to light touch and monofilament Nails normal

## 2015-08-29 ENCOUNTER — Encounter: Payer: Self-pay | Admitting: Gastroenterology

## 2015-09-28 ENCOUNTER — Other Ambulatory Visit: Payer: Self-pay | Admitting: Family Medicine

## 2015-09-28 DIAGNOSIS — E1169 Type 2 diabetes mellitus with other specified complication: Secondary | ICD-10-CM

## 2015-09-28 DIAGNOSIS — E669 Obesity, unspecified: Principal | ICD-10-CM

## 2015-10-02 ENCOUNTER — Other Ambulatory Visit (INDEPENDENT_AMBULATORY_CARE_PROVIDER_SITE_OTHER): Payer: BC Managed Care – PPO

## 2015-10-02 DIAGNOSIS — E669 Obesity, unspecified: Secondary | ICD-10-CM

## 2015-10-02 DIAGNOSIS — E119 Type 2 diabetes mellitus without complications: Secondary | ICD-10-CM | POA: Diagnosis not present

## 2015-10-02 DIAGNOSIS — E1169 Type 2 diabetes mellitus with other specified complication: Secondary | ICD-10-CM

## 2015-10-02 LAB — HEMOGLOBIN A1C: Hgb A1c MFr Bld: 7.2 % — ABNORMAL HIGH (ref 4.6–6.5)

## 2015-10-08 ENCOUNTER — Ambulatory Visit (INDEPENDENT_AMBULATORY_CARE_PROVIDER_SITE_OTHER): Payer: BC Managed Care – PPO | Admitting: Family Medicine

## 2015-10-08 ENCOUNTER — Encounter: Payer: Self-pay | Admitting: Family Medicine

## 2015-10-08 VITALS — BP 140/90 | HR 84 | Temp 99.1°F | Wt 316.5 lb

## 2015-10-08 DIAGNOSIS — Z23 Encounter for immunization: Secondary | ICD-10-CM | POA: Diagnosis not present

## 2015-10-08 DIAGNOSIS — E669 Obesity, unspecified: Secondary | ICD-10-CM | POA: Diagnosis not present

## 2015-10-08 DIAGNOSIS — Z119 Encounter for screening for infectious and parasitic diseases, unspecified: Secondary | ICD-10-CM

## 2015-10-08 DIAGNOSIS — E119 Type 2 diabetes mellitus without complications: Secondary | ICD-10-CM

## 2015-10-08 DIAGNOSIS — E1169 Type 2 diabetes mellitus with other specified complication: Secondary | ICD-10-CM

## 2015-10-08 NOTE — Patient Instructions (Addendum)
Call about an eye exam.  Take care.  Glad to see you.  Recheck labs before a physical in ~02/2016.   Keep working on your weight in the meantime.   Use OTC allergy medicine as needed.

## 2015-10-08 NOTE — Progress Notes (Signed)
Pre visit review using our clinic review tool, if applicable. No additional management support is needed unless otherwise documented below in the visit note.  Diabetes:  No meds Hypoglycemic episodes: no, except for HA if prolonged fasting and that is lifelong.   Hyperglycemic episodes: no Feet problems: no Blood Sugars averaging: ~120s eye exam within last year: due, d/w pt.   A1c d/w pt.  Similar to prev, still >7.   He's had some ear itching recently.  No pain.    Meds, vitals, and allergies reviewed.   ROS: See HPI.  Otherwise negative.    GEN: nad, alert and oriented HEENT: mucous membranes moist, TM wnl, nasal exam wnl, OP wnl.   NECK: supple w/o LA CV: rrr. PULM: ctab, no inc wob ABD: soft, +bs EXT: no edema

## 2015-10-08 NOTE — Assessment & Plan Note (Addendum)
Weight loss is the issue here.  D/w pt.  A1c d/w pt. No new meds.  Recheck in a few months.  He agrees.   Also d/w pt about HIV and HCV screening, he opts in.

## 2016-03-02 ENCOUNTER — Other Ambulatory Visit: Payer: Self-pay | Admitting: Family Medicine

## 2016-03-02 DIAGNOSIS — E669 Obesity, unspecified: Principal | ICD-10-CM

## 2016-03-02 DIAGNOSIS — E1169 Type 2 diabetes mellitus with other specified complication: Secondary | ICD-10-CM

## 2016-03-10 ENCOUNTER — Other Ambulatory Visit (INDEPENDENT_AMBULATORY_CARE_PROVIDER_SITE_OTHER): Payer: BC Managed Care – PPO

## 2016-03-10 ENCOUNTER — Other Ambulatory Visit: Payer: Self-pay | Admitting: Family Medicine

## 2016-03-10 DIAGNOSIS — E669 Obesity, unspecified: Secondary | ICD-10-CM | POA: Diagnosis not present

## 2016-03-10 DIAGNOSIS — E119 Type 2 diabetes mellitus without complications: Secondary | ICD-10-CM | POA: Diagnosis not present

## 2016-03-10 DIAGNOSIS — Z119 Encounter for screening for infectious and parasitic diseases, unspecified: Secondary | ICD-10-CM

## 2016-03-10 DIAGNOSIS — E1169 Type 2 diabetes mellitus with other specified complication: Secondary | ICD-10-CM

## 2016-03-10 LAB — COMPREHENSIVE METABOLIC PANEL
ALBUMIN: 3.8 g/dL (ref 3.5–5.2)
ALT: 22 U/L (ref 0–53)
AST: 24 U/L (ref 0–37)
Alkaline Phosphatase: 48 U/L (ref 39–117)
BUN: 14 mg/dL (ref 6–23)
CALCIUM: 9 mg/dL (ref 8.4–10.5)
CHLORIDE: 104 meq/L (ref 96–112)
CO2: 30 meq/L (ref 19–32)
Creatinine, Ser: 0.86 mg/dL (ref 0.40–1.50)
GFR: 96.92 mL/min (ref >60.00)
Glucose, Bld: 168 mg/dL — ABNORMAL HIGH (ref 70–99)
POTASSIUM: 4.7 meq/L (ref 3.5–5.1)
SODIUM: 141 meq/L (ref 135–145)
Total Bilirubin: 0.7 mg/dL (ref 0.2–1.2)
Total Protein: 7.1 g/dL (ref 6.0–8.3)

## 2016-03-10 LAB — LIPID PANEL
CHOLESTEROL: 192 mg/dL (ref 0–200)
HDL: 41.2 mg/dL (ref >39.00)
LDL Cholesterol: 134 mg/dL — ABNORMAL HIGH (ref 0–99)
NonHDL: 150.36
Total CHOL/HDL Ratio: 5
Triglycerides: 83 mg/dL (ref 0.0–149.0)
VLDL: 16.6 mg/dL (ref 0.0–40.0)

## 2016-03-10 LAB — HEMOGLOBIN A1C: HEMOGLOBIN A1C: 8.3 % — AB (ref 4.6–6.5)

## 2016-03-10 LAB — HEPATITIS C ANTIBODY: HCV AB: NEGATIVE

## 2016-03-11 LAB — HIV ANTIBODY (ROUTINE TESTING W REFLEX): HIV 1&2 Ab, 4th Generation: NONREACTIVE

## 2016-03-18 ENCOUNTER — Ambulatory Visit (INDEPENDENT_AMBULATORY_CARE_PROVIDER_SITE_OTHER): Payer: BC Managed Care – PPO | Admitting: Family Medicine

## 2016-03-18 ENCOUNTER — Encounter: Payer: Self-pay | Admitting: Family Medicine

## 2016-03-18 VITALS — BP 130/84 | HR 91 | Temp 98.9°F | Wt 324.5 lb

## 2016-03-18 DIAGNOSIS — Z0000 Encounter for general adult medical examination without abnormal findings: Secondary | ICD-10-CM

## 2016-03-18 DIAGNOSIS — E669 Obesity, unspecified: Secondary | ICD-10-CM

## 2016-03-18 DIAGNOSIS — E1169 Type 2 diabetes mellitus with other specified complication: Secondary | ICD-10-CM

## 2016-03-18 DIAGNOSIS — E119 Type 2 diabetes mellitus without complications: Secondary | ICD-10-CM

## 2016-03-18 MED ORDER — LISINOPRIL 5 MG PO TABS: 5.0000 mg | ORAL_TABLET | Freq: Every day | ORAL | Status: DC

## 2016-03-18 MED ORDER — SILDENAFIL CITRATE 100 MG PO TABS: 50.0000 mg | ORAL_TABLET | Freq: Every day | ORAL | Status: DC | PRN

## 2016-03-18 MED ORDER — METFORMIN HCL 500 MG PO TABS: 500.0000 mg | ORAL_TABLET | Freq: Two times a day (BID) | ORAL | Status: DC

## 2016-03-18 NOTE — Patient Instructions (Addendum)
Call about an eye exam when possible.  Take care.  Glad to see you.  Start with 1 metformin a day, then increase to twice a day.  Keep working on your weight with diet and exercise.  Recheck A1c before a visit in about 3 months.

## 2016-03-18 NOTE — Progress Notes (Signed)
Pre visit review using our clinic review tool, if applicable. No additional management support is needed unless otherwise documented below in the visit note.  CPE- See plan.  Routine anticipatory guidance given to patient.  See health maintenance. Tetanus 2008 Flu 2016 PNA 2014 Shingles due at 44.  Colonoscopy 2015 Prostate cancer screening and PSA options (with potential risks and benefits of testing vs not testing) were discussed along with recent recs/guidelines.  He declined testing PSA at this point. Encouraged eye clinic f/u. Due, d/w pt. he'll call for appointment.  Living will d/w pt. Wife designated if patient were incapacitated.   Diabetes:  No meds Hypoglycemic episodes: no Hyperglycemic episodes: no Feet problems: not usually, occ tinging after a long day of walking but that may be positional.   Blood Sugars averaging: ~140 on occ checks.   eye exam within last year: due, d/w pt.  Diet d/w pt.   D/w pt about A1c and starting metformin.   PMH and SH reviewed  Meds, vitals, and allergies reviewed.   ROS: See HPI.  Otherwise negative.    GEN: nad, alert and oriented HEENT: mucous membranes moist NECK: supple w/o LA CV: rrr. PULM: ctab, no inc wob ABD: soft, +bs EXT: no edema SKIN: no acute rash  Diabetic foot exam: Normal inspection No skin breakdown Callus on R 1st toe  Normal DP pulses Normal sensation to light touch and monofilament Nails normal

## 2016-03-19 NOTE — Assessment & Plan Note (Signed)
Start with 1 metformin a day, then increase to twice a day.  Keep working on [employer] with diet and exercise.  Recheck A1c before a visit in about 3 months.  He agrees.  Lab d/w pt.

## 2016-03-19 NOTE — Assessment & Plan Note (Signed)
Tetanus 2008  Flu 2016  PNA 2014  Shingles due at 64.  Colonoscopy 2015  Prostate cancer screening and PSA options (with potential risks and benefits of testing vs not testing) were discussed along with recent recs/guidelines. He declined testing PSA at this point.  Encouraged eye clinic f/u. Due, d/w pt. he'll call for appointment.  Living will d/w pt. Wife designated if patient were incapacitated.

## 2016-06-23 ENCOUNTER — Other Ambulatory Visit (INDEPENDENT_AMBULATORY_CARE_PROVIDER_SITE_OTHER): Payer: BC Managed Care – PPO

## 2016-06-23 DIAGNOSIS — E119 Type 2 diabetes mellitus without complications: Secondary | ICD-10-CM | POA: Diagnosis not present

## 2016-06-23 LAB — HEMOGLOBIN A1C: Hgb A1c MFr Bld: 7.7 % — ABNORMAL HIGH (ref 4.6–6.5)

## 2016-06-26 ENCOUNTER — Ambulatory Visit (INDEPENDENT_AMBULATORY_CARE_PROVIDER_SITE_OTHER): Payer: BC Managed Care – PPO | Admitting: Family Medicine

## 2016-06-26 ENCOUNTER — Encounter: Payer: Self-pay | Admitting: Family Medicine

## 2016-06-26 VITALS — BP 122/82 | HR 79 | Temp 98.7°F | Wt 317.8 lb

## 2016-06-26 DIAGNOSIS — E1169 Type 2 diabetes mellitus with other specified complication: Secondary | ICD-10-CM

## 2016-06-26 DIAGNOSIS — E119 Type 2 diabetes mellitus without complications: Secondary | ICD-10-CM

## 2016-06-26 DIAGNOSIS — E669 Obesity, unspecified: Secondary | ICD-10-CM | POA: Diagnosis not present

## 2016-06-26 NOTE — Progress Notes (Signed)
Pre visit review using our clinic review tool, if applicable. No additional management support is needed unless otherwise documented below in the visit note.  Diabetes:  Using medications without difficulties:yes, with metformin Kaeleb dose tolerated = 500mg .  Gi upset with 1000mg  a day.  Hypoglycemic episodes:no Hyperglycemic episodes:no Feet problems:no Blood Sugars averaging: usually 130-140s.   eye exam within last year: due, d/w pt.  Pending for this month.   Diet and exercise d/w pt.  A1c dw pt.  Down by 0.6.  Not at goal but better.   His diet came back to the forefront with his son's issues, see below.    He is caring for his father. His son was recently dx'd with Dm2.  His dog died this past week.    Meds, vitals, and allergies reviewed.   ROS: Per HPI unless specifically indicated in ROS section   GEN: nad, alert and oriented HEENT: mucous membranes moist NECK: supple w/o LA CV: rrr. PULM: ctab, no inc wob ABD: soft, +bs EXT: no edema

## 2016-06-26 NOTE — Assessment & Plan Note (Signed)
A1c dw pt.  Down by 0.6.  Not at goal but better.   His diet came back to the forefront with his son's dx.  Continue 500mg  metformin qd, work on diet and recheck in about 3 months.  He agrees.  Update me as needed.

## 2016-06-26 NOTE — Patient Instructions (Signed)
Recheck labs in about 3 months, keep working on diet in the meantime.  Continue metformin, 1 tab a day.   Take care.  Glad to see you.

## 2016-09-22 ENCOUNTER — Other Ambulatory Visit: Payer: Self-pay | Admitting: Family Medicine

## 2016-09-22 DIAGNOSIS — E669 Obesity, unspecified: Principal | ICD-10-CM

## 2016-09-22 DIAGNOSIS — E1169 Type 2 diabetes mellitus with other specified complication: Secondary | ICD-10-CM

## 2016-09-23 ENCOUNTER — Other Ambulatory Visit: Payer: Self-pay | Admitting: *Deleted

## 2016-09-23 DIAGNOSIS — E669 Obesity, unspecified: Principal | ICD-10-CM

## 2016-09-23 DIAGNOSIS — E1169 Type 2 diabetes mellitus with other specified complication: Secondary | ICD-10-CM

## 2016-09-23 MED ORDER — LISINOPRIL 5 MG PO TABS: 5.0000 mg | ORAL_TABLET | Freq: Every day | ORAL | 2 refills | Status: DC

## 2016-09-26 ENCOUNTER — Other Ambulatory Visit (INDEPENDENT_AMBULATORY_CARE_PROVIDER_SITE_OTHER): Payer: BC Managed Care – PPO

## 2016-09-26 DIAGNOSIS — E669 Obesity, unspecified: Secondary | ICD-10-CM | POA: Diagnosis not present

## 2016-09-26 DIAGNOSIS — E1169 Type 2 diabetes mellitus with other specified complication: Secondary | ICD-10-CM

## 2016-09-26 LAB — HEMOGLOBIN A1C: HEMOGLOBIN A1C: 7.6 % — AB (ref 4.6–6.5)

## 2016-12-29 ENCOUNTER — Other Ambulatory Visit: Payer: Self-pay | Admitting: Family Medicine

## 2016-12-29 DIAGNOSIS — E1169 Type 2 diabetes mellitus with other specified complication: Secondary | ICD-10-CM

## 2016-12-29 DIAGNOSIS — E669 Obesity, unspecified: Principal | ICD-10-CM

## 2016-12-30 ENCOUNTER — Other Ambulatory Visit (INDEPENDENT_AMBULATORY_CARE_PROVIDER_SITE_OTHER): Payer: BC Managed Care – PPO

## 2016-12-30 DIAGNOSIS — E669 Obesity, unspecified: Secondary | ICD-10-CM

## 2016-12-30 DIAGNOSIS — E1169 Type 2 diabetes mellitus with other specified complication: Secondary | ICD-10-CM

## 2016-12-30 LAB — HEMOGLOBIN A1C: Hgb A1c MFr Bld: 8.5 % — ABNORMAL HIGH (ref 4.6–6.5)

## 2017-01-02 ENCOUNTER — Ambulatory Visit (INDEPENDENT_AMBULATORY_CARE_PROVIDER_SITE_OTHER): Payer: BC Managed Care – PPO | Admitting: Family Medicine

## 2017-01-02 ENCOUNTER — Ambulatory Visit: Payer: BC Managed Care – PPO | Admitting: Family Medicine

## 2017-01-02 ENCOUNTER — Encounter: Payer: Self-pay | Admitting: Family Medicine

## 2017-01-02 DIAGNOSIS — E1169 Type 2 diabetes mellitus with other specified complication: Secondary | ICD-10-CM | POA: Diagnosis not present

## 2017-01-02 DIAGNOSIS — E669 Obesity, unspecified: Secondary | ICD-10-CM | POA: Diagnosis not present

## 2017-01-02 MED ORDER — GLIPIZIDE 5 MG PO TABS: 2.5000 mg | ORAL_TABLET | Freq: Every day | ORAL | 3 refills | Status: DC

## 2017-01-02 MED ORDER — SILDENAFIL CITRATE 20 MG PO TABS: 100.0000 mg | ORAL_TABLET | Freq: Every day | ORAL | 12 refills | Status: DC | PRN

## 2017-01-02 NOTE — Progress Notes (Signed)
His dad in the hospital, just had a feeding tube placed.  This has been a high stress period for patient.   Diabetes:  Using medications without difficulties: yes Hypoglycemic episodes:no Hyperglycemic episodes:no Feet problems:not usually, rare tingling in the feet.   Blood Sugars averaging: usually ~130s eye exam within last year: due, d/w pt.   A1c up, d/w pt.  Flu shot prev done at work.   ED.  Asking about change to 20mg  sildenafil.  No ADE on viagra.  Okay to change, d/w pt.   PMH and SH reviewed  Meds, vitals, and allergies reviewed.   ROS: Per HPI unless specifically indicated in ROS section   GEN: nad, alert and oriented HEENT: mucous membranes moist NECK: supple w/o LA CV: rrr. PULM: ctab, no inc wob ABD: soft, +bs EXT: no edema SKIN: no acute rash

## 2017-01-02 NOTE — Assessment & Plan Note (Addendum)
See orders. D/w pt.  See AVS.  Add on glipizide, 1/2 to 1 tab a day with breakfast.  If he can't get breakfast, then OK to skip a dose.  Recheck in about 3 months, labs ahead of time.  He'll do the best ne can on diet/exercise/weight.  Family situation noted re: his father's illness.

## 2017-01-02 NOTE — Patient Instructions (Signed)
Add on glipizide, 1/2 to 1 tab a day with breakfast.  If you can't get breakfast, then skip a dose.  Recheck in about 3 months, labs ahead of time.  Do the best you can on diet/exercise/weight.  Take care.  Glad to see you.

## 2017-01-05 ENCOUNTER — Ambulatory Visit: Payer: BC Managed Care – PPO | Admitting: Family Medicine

## 2017-01-12 ENCOUNTER — Ambulatory Visit (INDEPENDENT_AMBULATORY_CARE_PROVIDER_SITE_OTHER): Payer: BC Managed Care – PPO | Admitting: Family Medicine

## 2017-01-12 ENCOUNTER — Encounter: Payer: Self-pay | Admitting: Family Medicine

## 2017-01-12 VITALS — BP 124/70 | HR 96 | Temp 98.7°F | Wt 321.8 lb

## 2017-01-12 DIAGNOSIS — R05 Cough: Secondary | ICD-10-CM | POA: Diagnosis not present

## 2017-01-12 DIAGNOSIS — J40 Bronchitis, not specified as acute or chronic: Secondary | ICD-10-CM

## 2017-01-12 DIAGNOSIS — R059 Cough, unspecified: Secondary | ICD-10-CM

## 2017-01-12 MED ORDER — PROMETHAZINE-DM 6.25-15 MG/5ML PO SYRP: 5.0000 mL | ORAL_SOLUTION | Freq: Four times a day (QID) | ORAL | 0 refills | Status: DC | PRN

## 2017-01-12 MED ORDER — DOXYCYCLINE HYCLATE 100 MG PO TABS: 100.0000 mg | ORAL_TABLET | Freq: Two times a day (BID) | ORAL | 0 refills | Status: DC

## 2017-01-12 MED ORDER — BENZONATATE 100 MG PO CAPS
100.0000 mg | ORAL_CAPSULE | Freq: Three times a day (TID) | ORAL | 0 refills | Status: DC
Start: 2017-01-12 — End: 2017-04-07

## 2017-01-12 NOTE — Progress Notes (Signed)
Pre visit review using our clinic review tool, if applicable. No additional management support is needed unless otherwise documented below in the visit note. 

## 2017-01-12 NOTE — Patient Instructions (Addendum)
Your symptoms are most likely related to a viral illness. I have provided an antibiotic today due to your history of diabetes and presentation today. Please drink plenty of water so that your urine is pale yellow or clear. Also, get plenty of rest, use tylenol as needed for discomfort and follow up if symptoms do not improve in 3 to 4 days, worsen, or you develop a fever >101.   Acute Bronchitis, Adult Acute bronchitis is when air tubes (bronchi) in the lungs suddenly get swollen. The condition can make it hard to breathe. It can also cause these symptoms:  A cough.  Coughing up clear, yellow, or green mucus.  Wheezing.  Chest congestion.  Shortness of breath.  A fever.  Body aches.  Chills.  A sore throat. Follow these instructions at home: Medicines  Take over-the-counter and prescription medicines only as told by your doctor.  If you were prescribed an antibiotic medicine, take it as told by your doctor. Do not stop taking the antibiotic even if you start to feel better. General instructions  Rest.  Drink enough fluids to keep your pee (urine) clear or pale yellow.  Avoid smoking and secondhand smoke. If you smoke and you need help quitting, ask your doctor. Quitting will help your lungs heal faster.  Use an inhaler, cool mist vaporizer, or humidifier as told by your doctor.  Keep all follow-up visits as told by your doctor. This is important. How is this prevented? To lower your risk of getting this condition again:  Wash your hands often with soap and water. If you cannot use soap and water, use hand sanitizer.  Avoid contact with people who have cold symptoms.  Try not to touch your hands to your mouth, nose, or eyes.  Make sure to get the flu shot every year. Contact a doctor if:  Your symptoms do not get better in 2 weeks. Get help right away if:  You cough up blood.  You have chest pain.  You have very bad shortness of breath.  You become  dehydrated.  You faint (pass out) or keep feeling like you are going to pass out.  You keep throwing up (vomiting).  You have a very bad headache.  Your fever or chills gets worse. This information is not intended to replace advice given to you by your health care provider. Make sure you discuss any questions you have with your health care provider. Document Released: 05/19/2008 Document Revised: 07/09/2016 Document Reviewed: 05/21/2016 Elsevier Interactive Patient Education  2017 [employer].
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
## 2017-01-14 ENCOUNTER — Telehealth: Payer: Self-pay | Admitting: Family Medicine

## 2017-01-14 NOTE — Telephone Encounter (Signed)
Pt needs a work note due to missing work since Monday. [name] had told pt not to return to work until his fever was gone.  Pt still has fever today. Pt states he would like to return to work on Monday if [name] would write him out until then. Pt would like to pick up the note when ready.

## 2017-01-15 NOTE — Telephone Encounter (Signed)
Okay to write note for work.

## 2017-01-15 NOTE — Telephone Encounter (Signed)
Pt would like note to be written for 01-12-17 and will return to work on 01-19-17

## 2017-01-23 NOTE — Telephone Encounter (Signed)
Spoke to patient and stated that he was feeling okey and that he already has gone back to work, patient stated that he did not need the work note anymore.

## 2017-03-24 ENCOUNTER — Other Ambulatory Visit: Payer: Self-pay | Admitting: *Deleted

## 2017-03-24 NOTE — Telephone Encounter (Signed)
Last labs abnormal and future labs ordered for repeat. pls advise.

## 2017-03-25 MED ORDER — METFORMIN HCL 500 MG PO TABS: 500.0000 mg | ORAL_TABLET | Freq: Every day | ORAL | 3 refills | Status: DC

## 2017-03-25 NOTE — Telephone Encounter (Signed)
Sent. He has follow-up pending. Thanks.

## 2017-04-02 ENCOUNTER — Other Ambulatory Visit (INDEPENDENT_AMBULATORY_CARE_PROVIDER_SITE_OTHER): Payer: BC Managed Care – PPO

## 2017-04-02 DIAGNOSIS — E1169 Type 2 diabetes mellitus with other specified complication: Secondary | ICD-10-CM

## 2017-04-02 DIAGNOSIS — E669 Obesity, unspecified: Secondary | ICD-10-CM | POA: Diagnosis not present

## 2017-04-02 LAB — HEMOGLOBIN A1C: Hgb A1c MFr Bld: 7.3 % — ABNORMAL HIGH (ref 4.6–6.5)

## 2017-04-07 ENCOUNTER — Encounter: Payer: Self-pay | Admitting: Family Medicine

## 2017-04-07 ENCOUNTER — Ambulatory Visit (INDEPENDENT_AMBULATORY_CARE_PROVIDER_SITE_OTHER): Payer: BC Managed Care – PPO | Admitting: Family Medicine

## 2017-04-07 VITALS — BP 136/90 | HR 87 | Temp 98.4°F | Wt 325.8 lb

## 2017-04-07 DIAGNOSIS — E1169 Type 2 diabetes mellitus with other specified complication: Secondary | ICD-10-CM | POA: Diagnosis not present

## 2017-04-07 DIAGNOSIS — E669 Obesity, unspecified: Secondary | ICD-10-CM

## 2017-04-07 DIAGNOSIS — Z23 Encounter for immunization: Secondary | ICD-10-CM | POA: Diagnosis not present
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
## 2017-04-07 NOTE — Progress Notes (Signed)
Pre visit review using our clinic review tool, if applicable. No additional management support is needed unless otherwise documented below in the visit note. 

## 2017-04-07 NOTE — Patient Instructions (Addendum)
Check with your insurance to see if they will cover the shingles shot when you turn 61.   Physical with labs ahead of time in about 3-4 months.   Take care.  Glad to see you.  Update me as needed.   Don't change your meds for now.  Cal about an eye exam when you get a chance.

## 2017-04-08 NOTE — Assessment & Plan Note (Signed)
Labs d/w pt.  A1c improved.   rx done for meter, given to patient.  He is doing the best he can to care for his father and take care of his work obligations while caring for himself. Recheck in about 3 months. Try to work on diet and exercise in the meantime. He agrees. See after visit summary.

## 2017-06-23 ENCOUNTER — Other Ambulatory Visit: Payer: Self-pay | Admitting: *Deleted

## 2017-06-23 DIAGNOSIS — E1169 Type 2 diabetes mellitus with other specified complication: Secondary | ICD-10-CM

## 2017-06-23 DIAGNOSIS — E669 Obesity, unspecified: Principal | ICD-10-CM

## 2017-06-23 MED ORDER — LISINOPRIL 5 MG PO TABS: 5.0000 mg | ORAL_TABLET | Freq: Every day | ORAL | 0 refills | Status: DC

## 2017-06-23 NOTE — Telephone Encounter (Signed)
Received faxed refill request from pharmacy Refill sent electronically to pharmacy.

## 2017-07-26 ENCOUNTER — Other Ambulatory Visit: Payer: Self-pay | Admitting: Family Medicine

## 2017-07-26 DIAGNOSIS — E1169 Type 2 diabetes mellitus with other specified complication: Secondary | ICD-10-CM

## 2017-07-26 DIAGNOSIS — E669 Obesity, unspecified: Principal | ICD-10-CM

## 2017-08-03 ENCOUNTER — Other Ambulatory Visit (INDEPENDENT_AMBULATORY_CARE_PROVIDER_SITE_OTHER): Payer: BC Managed Care – PPO

## 2017-08-03 DIAGNOSIS — E669 Obesity, unspecified: Secondary | ICD-10-CM

## 2017-08-03 DIAGNOSIS — E1169 Type 2 diabetes mellitus with other specified complication: Secondary | ICD-10-CM

## 2017-08-03 LAB — COMPREHENSIVE METABOLIC PANEL
ALK PHOS: 44 U/L (ref 39–117)
ALT: 18 U/L (ref 0–53)
AST: 21 U/L (ref 0–37)
Albumin: 3.5 g/dL (ref 3.5–5.2)
BILIRUBIN TOTAL: 0.6 mg/dL (ref 0.2–1.2)
BUN: 11 mg/dL (ref 6–23)
CO2: 23 meq/L (ref 19–32)
CREATININE: 0.79 mg/dL (ref 0.40–1.50)
Calcium: 8.7 mg/dL (ref 8.4–10.5)
Chloride: 105 mEq/L (ref 96–112)
GFR: 106.38 mL/min (ref >60.00)
Glucose, Bld: 152 mg/dL — ABNORMAL HIGH (ref 70–99)
Potassium: 4 mEq/L (ref 3.5–5.1)
SODIUM: 137 meq/L (ref 135–145)
TOTAL PROTEIN: 7.1 g/dL (ref 6.0–8.3)

## 2017-08-03 LAB — LIPID PANEL
Cholesterol: 172 mg/dL (ref 0–200)
HDL: 46.4 mg/dL (ref >39.00)
LDL Cholesterol: 110 mg/dL — ABNORMAL HIGH (ref 0–99)
NONHDL: 125.62
TRIGLYCERIDES: 77 mg/dL (ref 0.0–149.0)
Total CHOL/HDL Ratio: 4
VLDL: 15.4 mg/dL (ref 0.0–40.0)

## 2017-08-03 LAB — HEMOGLOBIN A1C: Hgb A1c MFr Bld: 7.4 % — ABNORMAL HIGH (ref 4.6–6.5)

## 2017-08-07 ENCOUNTER — Encounter: Payer: Self-pay | Admitting: Family Medicine

## 2017-08-07 ENCOUNTER — Ambulatory Visit (INDEPENDENT_AMBULATORY_CARE_PROVIDER_SITE_OTHER): Payer: BC Managed Care – PPO | Admitting: Family Medicine

## 2017-08-07 VITALS — BP 128/82 | HR 76 | Temp 97.8°F | Ht 69.25 in | Wt 328.0 lb

## 2017-08-07 DIAGNOSIS — Z0000 Encounter for general adult medical examination without abnormal findings: Secondary | ICD-10-CM | POA: Diagnosis not present

## 2017-08-07 DIAGNOSIS — Z7189 Other specified counseling: Secondary | ICD-10-CM

## 2017-08-07 DIAGNOSIS — K219 Gastro-esophageal reflux disease without esophagitis: Secondary | ICD-10-CM

## 2017-08-07 DIAGNOSIS — E1169 Type 2 diabetes mellitus with other specified complication: Secondary | ICD-10-CM | POA: Diagnosis not present

## 2017-08-07 DIAGNOSIS — E7800 Pure hypercholesterolemia, unspecified: Secondary | ICD-10-CM

## 2017-08-07 DIAGNOSIS — E669 Obesity, unspecified: Secondary | ICD-10-CM

## 2017-08-07 MED ORDER — METFORMIN HCL 500 MG PO TABS: 500.0000 mg | ORAL_TABLET | Freq: Every day | ORAL | 3 refills | Status: DC

## 2017-08-07 MED ORDER — SILDENAFIL CITRATE 20 MG PO TABS: 100.0000 mg | ORAL_TABLET | Freq: Every day | ORAL | 12 refills | Status: DC | PRN

## 2017-08-07 MED ORDER — ATORVASTATIN CALCIUM 10 MG PO TABS
10.0000 mg | ORAL_TABLET | Freq: Every day | ORAL | 3 refills | Status: DC
Start: 2017-08-07 — End: 2018-08-24

## 2017-08-07 MED ORDER — LISINOPRIL 5 MG PO TABS: 5.0000 mg | ORAL_TABLET | Freq: Every day | ORAL | 3 refills | Status: DC

## 2017-08-07 MED ORDER — GLIPIZIDE 5 MG PO TABS: 5.0000 mg | ORAL_TABLET | Freq: Every day | ORAL | 3 refills | Status: DC

## 2017-08-07 NOTE — Progress Notes (Signed)
CPE- See plan.  Routine anticipatory guidance given to patient.  See health maintenance.  The possibility exists that previously documented standard health maintenance information may have been brought forward from a previous encounter into this note.  If needed, that same information has been updated to reflect the current situation based on today's encounter.    Tetanus 2018 Flu usually done at work.   PNA 2014 Shingles due at 37. d/w pt.   Colonoscopy 2015 Prostate cancer screening and PSA options (with potential risks and benefits of testing vs not testing) were discussed along with recent recs/guidelines.  He declined testing PSA at this point. Encouraged eye clinic f/u. Due, d/w pt. he'll call for appointment.  Living will d/w pt. Wife designated if patient were incapacitated. He is caring for his father and that is complicating his schedule.    He has some regurgitation with with some foods, but not all foods.  D/w pt.  No throat pain, no CP.  No choking.  Intermittent, not even weekly sx.  He can go for weeks w/o troubles.    Diabetes:  Using medications without difficulties: yes  Hypoglycemic episodes:no Hyperglycemic episodes:no Feet problems:no Blood Sugars averaging: usually ~130s eye exam within last year: due,d/w pt.   Exercise is limited, he is working on diet.   Labs d/w pt.    HLD.  ASCVD risk about 15%, d/w pt about risk and trial of statin to see if tolerated.  He agrees.    PMH and SH reviewed  Meds, vitals, and allergies reviewed.   ROS: Per HPI.  Unless specifically indicated otherwise in HPI, the patient denies:  General: fever. Eyes: acute vision changes ENT: sore throat Cardiovascular: chest pain Respiratory: SOB GI: vomiting GU: dysuria Musculoskeletal: acute back pain Derm: acute rash Neuro: acute motor dysfunction Psych: worsening mood Endocrine: polydipsia Heme: bleeding Allergy: hayfever  GEN: nad, alert and oriented HEENT: mucous  membranes moist NECK: supple w/o LA CV: rrr. PULM: ctab, no inc wob ABD: soft, +bs EXT: no edema SKIN: no acute rash  Diabetic foot exam: Normal inspection No skin breakdown No calluses  Normal DP pulses Normal sensation to light touch and monofilament Nails normal

## 2017-08-07 NOTE — Assessment & Plan Note (Signed)
Tetanus 2018 Flu usually done at work.   PNA 2014 Shingles due at 67. d/w pt.   Colonoscopy 2015 Prostate cancer screening and PSA options (with potential risks and benefits of testing vs not testing) were discussed along with recent recs/guidelines.  He declined testing PSA at this point. Encouraged eye clinic f/u. Due, d/w pt. he'll call for appointment.  Living will d/w pt. Wife designated if patient were incapacitated. He is caring for his father and that is complicating his schedule.   Support offered.

## 2017-08-07 NOTE — Assessment & Plan Note (Signed)
Living will d/w pt.  Wife designated if patient were incapacitated.   ?

## 2017-08-07 NOTE — Assessment & Plan Note (Signed)
ASCVD risk about 15%, d/w pt about risk and trial of statin to see if tolerated.  Routine statin cautions given.

## 2017-08-07 NOTE — Assessment & Plan Note (Signed)
dw pt to avoid eating near bedtime and elevate the head of the bed.   Avoid greasy foods.   If more trouble then let me know; we can refer to GI if continued sx if needed or if true dysphagia.  Okay for outpatient f/u.

## 2017-08-07 NOTE — Assessment & Plan Note (Signed)
A1c similar to prev.  Needs weight loss more than med change.  D/w pt.

## 2017-08-07 NOTE — Assessment & Plan Note (Signed)
D/w pt about diet and exercise and weight loss.

## 2017-08-07 NOTE — Patient Instructions (Addendum)
Check with your insurance to see if they will cover the shingrix shot. Eye clinics: [facility], [place], [place], [place].   Try taking lipitor.  If you have aches, then stop it and let me know.  Plan on a recheck in about 4 months, labs ahead of time if possible.  Avoid eating near bedtime and elevate the head of the bed.   Avoid greasy foods.   If more trouble then let me know.   Take care.  Glad to see you.

## 2017-09-01 ENCOUNTER — Telehealth: Payer: Self-pay | Admitting: Family Medicine

## 2017-09-01 ENCOUNTER — Encounter: Payer: Self-pay | Admitting: Family Medicine

## 2017-09-01 ENCOUNTER — Ambulatory Visit (INDEPENDENT_AMBULATORY_CARE_PROVIDER_SITE_OTHER): Payer: BC Managed Care – PPO | Admitting: Family Medicine

## 2017-09-01 DIAGNOSIS — S20461A Insect bite (nonvenomous) of right back wall of thorax, initial encounter: Secondary | ICD-10-CM | POA: Diagnosis not present

## 2017-09-01 DIAGNOSIS — W57XXXA Bitten or stung by nonvenomous insect and other nonvenomous arthropods, initial encounter: Secondary | ICD-10-CM

## 2017-09-01 MED ORDER — DOXYCYCLINE HYCLATE 100 MG PO TABS: 100.0000 mg | ORAL_TABLET | Freq: Two times a day (BID) | ORAL | 0 refills | Status: DC

## 2017-09-01 NOTE — Telephone Encounter (Signed)
Pt has appt with Dr Damita Dunnings 09/01/17 at [facility].

## 2017-09-01 NOTE — Telephone Encounter (Signed)
Patient Name: [name]  DOB: [birth year]    Initial Comment Caller states c/o tick bite with redness after being removed.   Nurse Assessment  Nurse: Leilani Merl, RN, Heather Date/Time (Eastern Time): 09/01/2017 9:50:28 AM  Confirm and document reason for call. If symptomatic, describe symptoms. ---Caller states c/o tick bite with redness after being removed. He found it a week ago this past Sunday, it is still red.  Does the patient have any new or worsening symptoms? ---Yes  Will a triage be completed? ---Yes  Related visit to physician within the last 2 weeks? ---No  Does the PT have any chronic conditions? (i.e. diabetes, asthma, etc.) ---Yes  List chronic conditions. ---DM  Is this a behavioral health or substance abuse call? ---No     Guidelines    Guideline Title Affirmed Question Affirmed Notes  Tick Bite Can't remove tick's head that was broken off in the skin (after trying Care Advice)    Final Disposition User   See Physician within 24 Hours Standifer, RN, Water quality scientist    Comments  Appt with Dr. Damita Dunnings at 3 pm today.   Referrals  REFERRED TO PCP OFFICE   Disagree/Comply: Comply

## 2017-09-01 NOTE — Progress Notes (Addendum)
Tick bite.  Bitten about 10 days ago.    Unclear if completely removed. Local irritation. Occasionally itchy. No fevers. No other new rash.   Vertigo this AM.  Resolved after a few seconds.  No other sx.  No other episodes.    His father is on hospice, discussed.    Meds, vitals, and allergies reviewed.   ROS: Per HPI unless specifically indicated in ROS section   GEN: nad, alert and oriented HEENT: mucous membranes moist, TM wnl NECK: supple w/o LA CV: rrr. PULM: ctab, no inc wob ABD: soft, +bs EXT: no edema SKIN: no acute rash except for 10x71mm reddish blanching area at the tick bite site on the R upper back.  No FB seen.

## 2017-09-01 NOTE — Patient Instructions (Signed)
Hold the antibiotics for now.  If the rash is spreading or if you get a bullseye rash or fever, then start doxy.  Take care.  Glad to see you.

## 2017-09-02 DIAGNOSIS — W57XXXA Bitten or stung by nonvenomous insect and other nonvenomous arthropods, initial encounter: Secondary | ICD-10-CM | POA: Insufficient documentation

## 2017-09-02 NOTE — Assessment & Plan Note (Signed)
No systemic sx (vertigo was brief and likely unrelated).  Looks to have a local irritation but no active spreading infectious cellulitis. Okay to use topical steroid as needed for itching. Hold doxy for now.  If any spreading erythema or other systemic symptoms such as fever then start antibiotics. No sign of retained foreign body. Okay for outpatient follow-up. He agrees.  Condolences offered regarding his father. He will update me if vertigo continues. He will get his flu shot at work.

## 2017-09-22 ENCOUNTER — Ambulatory Visit (INDEPENDENT_AMBULATORY_CARE_PROVIDER_SITE_OTHER): Payer: BC Managed Care – PPO | Admitting: Family Medicine

## 2017-09-22 ENCOUNTER — Encounter: Payer: Self-pay | Admitting: Family Medicine

## 2017-09-22 VITALS — BP 134/78 | HR 87 | Temp 98.8°F | Wt 331.5 lb

## 2017-09-22 DIAGNOSIS — W57XXXA Bitten or stung by nonvenomous insect and other nonvenomous arthropods, initial encounter: Secondary | ICD-10-CM

## 2017-09-22 DIAGNOSIS — L237 Allergic contact dermatitis due to plants, except food: Secondary | ICD-10-CM

## 2017-09-22 MED ORDER — CEFTRIAXONE SODIUM 1 G IJ SOLR
1.0000 g | Freq: Once | INTRAMUSCULAR | Status: AC
  Administered 2017-09-22: 1 g via INTRAMUSCULAR

## 2017-09-22 MED ORDER — DOXYCYCLINE HYCLATE 100 MG PO TABS: 100.0000 mg | ORAL_TABLET | Freq: Two times a day (BID) | ORAL | 0 refills | Status: DC

## 2017-09-22 MED ORDER — TRIAMCINOLONE ACETONIDE 0.1 % EX CREA: 1.0000 "application " | TOPICAL_CREAM | Freq: Two times a day (BID) | CUTANEOUS | 0 refills | Status: DC

## 2017-09-22 NOTE — Progress Notes (Signed)
He was trimming bushes and trees and then mowed. He didn't see poison oak or ivy but he may have contacted some.  D/w pt.  Saturday night he washed up and noted a small irritated site on L forearm, possible insect bite.   On the left forearm has gotten progressively itchy and red. It is not painful but it is red and the affected area is spreading. He also has some separate small itchy spots on the right forearm and hand. No fevers or chills. He does not feel unwell otherwise.  Meds, vitals, and allergies reviewed.   ROS: Per HPI unless specifically indicated in ROS section   nad ncat Mmm Neck supple, no LA L forearm with 30 x17 cm area, red and puffy but not tender.  Normal distal ROM and NV intact.  Normal radial pulse.  Irritated itchy papules on the R forearm.  rrr ctab

## 2017-09-22 NOTE — Patient Instructions (Signed)
Rocephin shot today.  Start doxycycline tonight.  Update me tomorrow.  If worse/fever then go to ER.  Use triamcinolone if needed for itching.   Take care.  Glad to see you.

## 2017-09-23 ENCOUNTER — Telehealth: Payer: Self-pay

## 2017-09-23 DIAGNOSIS — W57XXXA Bitten or stung by nonvenomous insect and other nonvenomous arthropods, initial encounter: Secondary | ICD-10-CM | POA: Insufficient documentation

## 2017-09-23 NOTE — Telephone Encounter (Signed)
When the skin issue is resolved.  Thanks.

## 2017-09-23 NOTE — Telephone Encounter (Signed)
Patient notified as instructed by telephone and verbalized understanding. 

## 2017-09-23 NOTE — Telephone Encounter (Signed)
Patient notified as instructed by telephone and verbalized understanding. Patent stated that you told him to hold off on the cholesterol medication for now, but wants to know when he should start it?

## 2017-09-23 NOTE — Telephone Encounter (Signed)
Pt left v/m pt seen 09/22/17 and pt was to cb with update. I spoke with pt; no fever and pt is not any worse; pt does not think streaks are as far as they were; pt taking abx.

## 2017-09-23 NOTE — Telephone Encounter (Signed)
Great, continue as is.  Please update me again early Friday, sooner if needed.  Thanks.

## 2017-09-23 NOTE — Assessment & Plan Note (Signed)
The presumption is that he had either some type of unrecalled insect bite or poison ivy exposure or similar, which subsequently got infected. He appears to have cellulitis of left forearm. He does not have any suggestion of compartment syndrome. No focal abscess noted on exam. No fluctuance. No lesion appears to need drainage. Distally neurovascular intact. 1 g of Rocephin given IM and clinic. Start doxycycline. Update me in the very near future. Routine ER cautions given. To ER if fever or worsening or if he feels systemically ill. He agrees. He can use triamcinolone on the itchy spots in the meantime. All discussed with patient and he understood. At this point still okay for outpatient follow-up since he is so well-appearing otherwise.

## 2017-09-24 ENCOUNTER — Telehealth: Payer: Self-pay | Admitting: Family Medicine

## 2017-09-24 NOTE — Telephone Encounter (Signed)
Great, continue as is, update me as needed. Would start lipitor when all of this is resolved.

## 2017-09-24 NOTE — Telephone Encounter (Signed)
-----   Message from Josetta Huddle, [place] sent at 09/24/2017  2:49 PM EDT ----- Patient says he thinks it is getting better.  The area in question is smaller, the swelling is better and he doesn't feel any "heat" in it. ----- Message ----- From: Tonia Ghent, MD Sent: 09/23/2017  12:03 PM To: Josetta Huddle, CMA  Please get update on patient if he does not call in with update later this afternoon. Thanks.

## 2017-09-28 NOTE — Telephone Encounter (Signed)
Patient advised.

## 2017-10-09 ENCOUNTER — Ambulatory Visit (INDEPENDENT_AMBULATORY_CARE_PROVIDER_SITE_OTHER): Payer: BC Managed Care – PPO | Admitting: Family Medicine

## 2017-10-09 ENCOUNTER — Encounter: Payer: Self-pay | Admitting: Family Medicine

## 2017-10-09 DIAGNOSIS — L03114 Cellulitis of left upper limb: Secondary | ICD-10-CM | POA: Diagnosis not present

## 2017-10-09 MED ORDER — DOXYCYCLINE HYCLATE 100 MG PO TABS: 100.0000 mg | ORAL_TABLET | Freq: Two times a day (BID) | ORAL | 0 refills | Status: DC

## 2017-10-09 NOTE — Patient Instructions (Addendum)
This looks more like residual irritation and central scar tissue and not active infection.  Hold the antibiotics for now and if any worse then start it. Update me as needed.   Take care.  Glad to see you.

## 2017-10-09 NOTE — Progress Notes (Signed)
Has been off abx for about 5 days. Still with residual mild changes on the L medial forearm.  Not painful.  Mildly warm locally.  He can fell a small knot <<1cm centrally.  occ itchy.  No FCNAVD.    Meds, vitals, and allergies reviewed.   ROS: Per HPI unless specifically indicated in ROS section   nad ncat rrr ctab 4.5 x 4x5 cm w/o fluctuant mass but central thickening of the skin <<1cm across.  No red streaking.  Clearly better from prev.

## 2017-10-11 DIAGNOSIS — L0390 Cellulitis, unspecified: Secondary | ICD-10-CM | POA: Insufficient documentation

## 2017-10-11 DIAGNOSIS — L03319 Cellulitis of trunk, unspecified: Secondary | ICD-10-CM | POA: Insufficient documentation

## 2017-10-11 NOTE — Assessment & Plan Note (Signed)
This looks more like residual irritation and central scar tissue and not active infection.  It is currently resolved, with some postinflammatory hyperpigmentation. Out of an abundance of caution, I gave him a prescription of doxycycline to hold. Do not start medication yet, if worsening with redness or pain or fever did start medication and update me.  He agrees.

## 2017-12-09 ENCOUNTER — Encounter: Payer: Self-pay | Admitting: Family Medicine

## 2017-12-09 ENCOUNTER — Ambulatory Visit: Payer: BC Managed Care – PPO | Admitting: Family Medicine

## 2017-12-09 VITALS — BP 156/88 | HR 78 | Temp 98.3°F | Wt 328.2 lb

## 2017-12-09 DIAGNOSIS — J22 Unspecified acute lower respiratory infection: Secondary | ICD-10-CM | POA: Diagnosis not present

## 2017-12-09 MED ORDER — GUAIFENESIN-CODEINE 100-10 MG/5ML PO SYRP: 5.0000 mL | ORAL_SOLUTION | Freq: Three times a day (TID) | ORAL | 0 refills | Status: DC | PRN

## 2017-12-09 MED ORDER — AMOXICILLIN 875 MG PO TABS: 875.0000 mg | ORAL_TABLET | Freq: Two times a day (BID) | ORAL | 0 refills | Status: DC

## 2017-12-09 NOTE — Progress Notes (Signed)
Subjective:    Patient ID: Brett Stanley, male    DOB: [birth year], 61 y.o.   MRN: [id number]  HPI This is a 61 yo male who presents today with cough x 2 weeks. Started with cold, got better then got worse. Nasal drainage with color, nasal congestion and headache. Using Dayquil/Nyquil, drinking lots of fluids. No wheezing, no SOB. Subjective fever. Some sore throat, no ear pain. Felt terrible yesterday. Everyone sick at work.  Has seasonal allergies, takes allegra most of the year, takes a break for a couple of months over the winter- not currently taking. Has follow up in 2 weeks of elevated hemoglobin A1C.   Past Medical History:  Diagnosis Date  . Allergy   . HTN (hypertension)   . Type II or unspecified type diabetes mellitus without mention of complication, not stated as uncontrolled    Past Surgical History:  Procedure Laterality Date  . KNEE ARTHROSCOPY Right 2014  . VASECTOMY  1997   Dr. Reece Agar   Family History  Problem Relation Age of Onset  . Alzheimer's disease Mother   . Diabetes Mother   . Hypertension Other   . Cancer Other        Leukemia  . Sleep apnea Sister   . Diabetes Maternal Uncle   . Depression Maternal Uncle   . Stroke Maternal Grandfather   . Colon cancer Neg Hx   . Prostate cancer Neg Hx    Social History   Tobacco Use  . Smoking status: Never Smoker  . Smokeless tobacco: Never Used  Substance Use Topics  . Alcohol use: Yes    Alcohol/week: 0.0 oz    Comment: very rare use  . Drug use: No      Review of Systems Per HPI    Objective:   Physical Exam  Constitutional: He is oriented to person, place, and time. He appears well-developed and well-nourished. No distress.  HENT:  Head: Normocephalic and atraumatic.  Right Ear: Tympanic membrane, external ear and ear canal normal.  Left Ear: Tympanic membrane, external ear and ear canal normal.  Nose: Mucosal edema present.  Mouth/Throat: Uvula is midline and mucous membranes are  normal. Posterior oropharyngeal erythema present. No oropharyngeal exudate, posterior oropharyngeal edema or tonsillar abscesses.  Eyes: Conjunctivae are normal.  Cardiovascular: Normal rate, regular rhythm and normal heart sounds.  Pulmonary/Chest: Effort normal and breath sounds normal.  Frequent, dry cough.   Neurological: He is alert and oriented to person, place, and time.  Skin: Skin is warm and dry. He is not diaphoretic.  Psychiatric: He has a normal mood and affect. His behavior is normal. Judgment and thought content normal.  Vitals reviewed.     BP (!) 156/88   Pulse 78   Temp 98.3 F (36.8 C) (Oral)   Wt (!) 328 lb 4 oz (148.9 kg)   SpO2 97%   BMI 48.12 kg/m  Wt Readings from Last 3 Encounters:  12/09/17 (!) 328 lb 4 oz (148.9 kg)  10/09/17 (!) 327 lb 4 oz (148.4 kg)  09/22/17 (!) 331 lb 8 oz (150.4 kg)       Assessment & Plan:  1. Lower respiratory infection - given duration of symptoms and improving/worsening course, will cover with antibiotics - amoxicillin (AMOXIL) 875 MG tablet; Take 1 tablet (875 mg total) by mouth 2 (two) times daily.  Dispense: 14 tablet; Refill: 0 - guaiFENesin-codeine (ROBITUSSIN AC) 100-10 MG/5ML syrup; Take 5 mLs by mouth 3 (three) times daily as needed  for cough.  Dispense: 120 mL; Refill: 0 - Provided written and verbal information regarding diagnosis and treatment. -  Patient Instructions  For nasal congestion, you can use Afrin type nasal spray twice a day for 3 days Restart your antihistamine for 7-10 days Drink enough fluids to make your urine light yellow. For fever/chill/muscle aches you can take over the counter acetaminophen or ibuprofen.  Please come back in if you are not better in 5-7 days or if you develop wheezing, shortness of breath or persistent vomiting.    Clarene Reamer, FNP-BC  [facility] at [facility]  12/09/2017 4:46 PM

## 2017-12-09 NOTE — Patient Instructions (Signed)
For nasal congestion, you can use Afrin type nasal spray twice a day for 3 days Restart your antihistamine for 7-10 days Drink enough fluids to make your urine light yellow. For fever/chill/muscle aches you can take over the counter acetaminophen or ibuprofen.  Please come back in if you are not better in 5-7 days or if you develop wheezing, shortness of breath or persistent vomiting.

## 2017-12-14 ENCOUNTER — Other Ambulatory Visit: Payer: Self-pay

## 2017-12-14 ENCOUNTER — Ambulatory Visit (INDEPENDENT_AMBULATORY_CARE_PROVIDER_SITE_OTHER): Payer: BC Managed Care – PPO

## 2017-12-14 ENCOUNTER — Encounter: Payer: Self-pay | Admitting: Emergency Medicine

## 2017-12-14 ENCOUNTER — Ambulatory Visit: Payer: BC Managed Care – PPO | Admitting: Emergency Medicine

## 2017-12-14 ENCOUNTER — Telehealth: Payer: Self-pay | Admitting: *Deleted

## 2017-12-14 VITALS — BP 132/66 | HR 94 | Temp 98.3°F | Resp 16 | Ht 69.75 in | Wt 324.4 lb

## 2017-12-14 DIAGNOSIS — R05 Cough: Secondary | ICD-10-CM

## 2017-12-14 DIAGNOSIS — J22 Unspecified acute lower respiratory infection: Secondary | ICD-10-CM

## 2017-12-14 DIAGNOSIS — R059 Cough, unspecified: Secondary | ICD-10-CM

## 2017-12-14 MED ORDER — AMOXICILLIN-POT CLAVULANATE 875-125 MG PO TABS: 1.0000 | ORAL_TABLET | Freq: Two times a day (BID) | ORAL | 0 refills | Status: DC

## 2017-12-14 MED ORDER — AZITHROMYCIN 250 MG PO TABS: ORAL_TABLET | ORAL | 0 refills | Status: DC

## 2017-12-14 MED ORDER — BENZONATATE 200 MG PO CAPS: 200.0000 mg | ORAL_CAPSULE | Freq: Two times a day (BID) | ORAL | 0 refills | Status: DC | PRN

## 2017-12-14 MED ORDER — PROMETHAZINE-CODEINE 6.25-10 MG/5ML PO SYRP: 5.0000 mL | ORAL_SOLUTION | Freq: Every evening | ORAL | 0 refills | Status: DC | PRN

## 2017-12-14 NOTE — Patient Instructions (Addendum)
     IF you received an x-ray today, you will receive an invoice from [facility]. Please contact [facility] at [phone] with questions or concerns regarding your invoice.   IF you received labwork today, you will receive an invoice from [facility]. Please contact [facility] at [phone] with questions or concerns regarding your invoice.   Our billing staff will not be able to assist you with questions regarding bills from these companies.  You will be contacted with the lab results as soon as they are available. The fastest way to get your results is to activate your My Chart account. Instructions are located on the last page of this paperwork. If you have not heard from us regarding the results in 2 weeks, please contact this office.     Acute Bronchitis, Adult Acute bronchitis is when air tubes (bronchi) in the lungs suddenly get swollen. The condition can make it hard to breathe. It can also cause these symptoms:  A cough.  Coughing up clear, yellow, or green mucus.  Wheezing.  Chest congestion.  Shortness of breath.  A fever.  Body aches.  Chills.  A sore throat.  Follow these instructions at home: Medicines  Take over-the-counter and prescription medicines only as told by your doctor.  If you were prescribed an antibiotic medicine, take it as told by your doctor. Do not stop taking the antibiotic even if you start to feel better. General instructions  Rest.  Drink enough fluids to keep your pee (urine) clear or pale yellow.  Avoid smoking and secondhand smoke. If you smoke and you need help quitting, ask your doctor. Quitting will help your lungs heal faster.  Use an inhaler, cool mist vaporizer, or humidifier as told by your doctor.  Keep all follow-up visits as told by your doctor. This is important. How is this prevented? To lower your risk of getting this condition again:  Wash your hands often with soap and water. If you cannot  use soap and water, use hand sanitizer.  Avoid contact with people who have cold symptoms.  Try not to touch your hands to your mouth, nose, or eyes.  Make sure to get the flu shot every year.  Contact a doctor if:  Your symptoms do not get better in 2 weeks. Get help right away if:  You cough up blood.  You have chest pain.  You have very bad shortness of breath.  You become dehydrated.  You faint (pass out) or keep feeling like you are going to pass out.  You keep throwing up (vomiting).  You have a very bad headache.  Your fever or chills gets worse. This information is not intended to replace advice given to you by your health care provider. Make sure you discuss any questions you have with your health care provider. Document Released: 05/19/2008 Document Revised: 07/09/2016 Document Reviewed: 05/21/2016 Elsevier Interactive Patient Education  2018 Elsevier Inc.  

## 2017-12-14 NOTE — Telephone Encounter (Signed)
Called pharmacy spoke to [facility]) to cancel amoxicillin because azithromycin was ordered, per Dr Mitchel Honour.

## 2017-12-14 NOTE — Progress Notes (Signed)
Brett Stanley 61 y.o.   Chief Complaint  Patient presents with  . Cough    was seen at [facility]  . Nasal Congestion    HISTORY OF PRESENT ILLNESS: This is a 61 y.o. male complaining of cough and congestion that started Dec 13th; got better but symptoms back again after getting exposed to sick kids. Mostly c/o dry cough, worse at night. No other significant symptoms.  Cough  This is a new problem. The current episode started in the past 7 days. The problem has been gradually worsening. The problem occurs every few minutes. The cough is non-productive. Associated symptoms include nasal congestion. Pertinent negatives include no chest pain, chills, ear pain, fever, headaches, heartburn, hemoptysis, myalgias, rash, sore throat, shortness of breath or wheezing. Nothing aggravates the symptoms. He has tried OTC cough suppressant for the symptoms.     Prior to Admission medications   Medication Sig Start Date End Date Taking? Authorizing Provider  amoxicillin (AMOXIL) 875 MG tablet Take 1 tablet (875 mg total) by mouth 2 (two) times daily. 12/09/17  Yes Elby Beck, FNP  glipiZIDE (GLUCOTROL) 5 MG tablet Take 1 tablet (5 mg total) by mouth daily before breakfast. 08/07/17  Yes Tonia Ghent, MD  guaiFENesin-codeine [facility]) 100-10 MG/5ML syrup Take 5 mLs by mouth 3 (three) times daily as needed for cough. 12/09/17  Yes Elby Beck, FNP  lisinopril (PRINIVIL,ZESTRIL) 5 MG tablet Take 1 tablet (5 mg total) by mouth daily. 08/07/17  Yes Tonia Ghent, MD  metFORMIN (GLUCOPHAGE) 500 MG tablet Take 1 tablet (500 mg total) by mouth daily with breakfast. 08/07/17  Yes Tonia Ghent, MD  OVER THE COUNTER MEDICATION 180 mg daily.   Yes [provider]  Oxymetazoline HCl (AFRIN SINUS NA) Place into the nose 2 (two) times daily.   Yes [provider]  sildenafil (REVATIO) 20 MG tablet Take 5 tablets (100 mg total) by mouth daily as needed. 08/07/17   Yes Tonia Ghent, MD  atorvastatin (LIPITOR) 10 MG tablet Take 1 tablet (10 mg total) by mouth daily. Patient not taking: Reported on 12/09/2017 08/07/17   Tonia Ghent, MD    Allergies  Allergen Reactions  . Erythromycin Nausea Only  . Metformin And Related Other (See Comments)    Kody tolerated dose is 500mg  a day.   . Oxycodone     nausea    Patient Active Problem List   Diagnosis Date Noted  . Cellulitis 10/11/2017  . Bite, insect 09/23/2017  . Tick bite 09/02/2017  . GERD (gastroesophageal reflux disease) 08/07/2017  . Advance care planning 02/26/2015  . Snoring 10/12/2014  . Knee pain 03/07/2013  . Routine general medical examination at a health care facility 11/16/2012  . Morbid obesity ([place]) 06/22/2007  . Diabetes mellitus type 2 in obese ([place]) 06/22/2007  . HYPERCHOLESTEROLEMIA 06/15/2007  . ALLERGIC RHINITIS 06/15/2007    Past Medical History:  Diagnosis Date  . Allergy   . HTN (hypertension)   . Type II or unspecified type diabetes mellitus without mention of complication, not stated as uncontrolled     Past Surgical History:  Procedure Laterality Date  . KNEE ARTHROSCOPY Right 2014  . VASECTOMY  1997   Dr. Reece Agar    Social History   Socioeconomic History  . Marital status: Married    Spouse name: Not on file  . Number of children: 2  . Years of education: Not on file  . Highest education  level: Not on file  Social Needs  . Financial resource strain: Not on file  . Food insecurity - worry: Not on file  . Food insecurity - inability: Not on file  . Transportation needs - medical: Not on file  . Transportation needs - non-medical: Not on file  Occupational History  . Occupation: Environmental manager: [employer]  Tobacco Use  . Smoking status: Never Smoker  . Smokeless tobacco: Never Used  Substance and Sexual Activity  . Alcohol use: Yes    Alcohol/week: 0.0 oz    Comment: very rare use  . Drug use: No  .  Sexual activity: Not on file  Other Topics Concern  . Not on file  Social History Narrative   Married 1988   2 sons   Government social research officer for [employer]    Family History  Problem Relation Age of Onset  . Alzheimer's disease Mother   . Diabetes Mother   . Hypertension Other   . Cancer Other        Leukemia  . Sleep apnea Sister   . Diabetes Maternal Uncle   . Depression Maternal Uncle   . Stroke Maternal Grandfather   . Colon cancer Neg Hx   . Prostate cancer Neg Hx      Review of Systems  Constitutional: Negative for chills and fever.  HENT: Positive for congestion. Negative for ear pain, nosebleeds and sore throat.   Eyes: Negative.   Respiratory: Positive for cough. Negative for hemoptysis, shortness of breath and wheezing.   Cardiovascular: Negative for chest pain and palpitations.  Gastrointestinal: Negative.  Negative for abdominal pain, diarrhea, heartburn, nausea and vomiting.  Genitourinary: Negative for dysuria and hematuria.  Musculoskeletal: Negative for back pain, myalgias and neck pain.  Skin: Negative for rash.  Neurological: Negative.  Negative for sensory change, focal weakness and headaches.  Endo/Heme/Allergies: Negative.   All other systems reviewed and are negative.  Vitals:   12/14/17 1429  BP: 132/66  Pulse: 94  Resp: 16  Temp: 98.3 F (36.8 C)  SpO2: 94%     Physical Exam  Constitutional: He is oriented to person, place, and time. He appears well-developed.  Obese.  HENT:  Head: Normocephalic and atraumatic.  Right Ear: External ear normal.  Left Ear: External ear normal.  Mouth/Throat: Oropharynx is clear and moist.  Eyes: Conjunctivae and EOM are normal. Pupils are equal, round, and reactive to light.  Neck: Normal range of motion. Neck supple. No JVD present.  Cardiovascular: Normal rate, regular rhythm and normal heart sounds.  Pulmonary/Chest: Effort normal and breath sounds normal.  Abdominal: Soft. There is no  tenderness.  Musculoskeletal: Normal range of motion.  Lymphadenopathy:    He has no cervical adenopathy.  Neurological: He is alert and oriented to person, place, and time. No sensory deficit. He exhibits normal muscle tone.  Skin: Skin is warm and dry. Capillary refill takes less than 2 seconds. No rash noted.  Psychiatric: He has a normal mood and affect. His behavior is normal.  Vitals reviewed.  Dg Chest 2 View  Result Date: 12/14/2017 CLINICAL DATA:  Cough, history hypertension, type II diabetes mellitus EXAM: CHEST  2 VIEW COMPARISON:  None FINDINGS: Normal heart size, mediastinal contours, and pulmonary vascularity. Mild central peribronchial thickening. Lungs clear. No pleural effusion or pneumothorax. No acute osseous findings. IMPRESSION: Mild bronchitic changes without infiltrate. Electronically Signed   By: Lavonia Dana M.D.   On: 12/14/2017 15:05  ASSESSMENT & PLAN:  Yann was seen today for cough and nasal congestion.  Diagnoses and all orders for this visit:  Cough -     DG Chest 2 View; Future -     benzonatate (TESSALON) 200 MG capsule; Take 1 capsule (200 mg total) by mouth 2 (two) times daily as needed for cough. -     promethazine-codeine (PHENERGAN WITH CODEINE) 6.25-10 MG/5ML syrup; Take 5 mLs by mouth at bedtime as needed for cough.  Lower respiratory infection -     Discontinue: amoxicillin-clavulanate (AUGMENTIN) 875-125 MG tablet; Take 1 tablet by mouth 2 (two) times daily for 7 days. -     azithromycin (ZITHROMAX) 250 MG tablet; Sig as indicated    Patient Instructions       IF you received an x-ray today, you will receive an invoice from [facility]. Please contact [facility] at [phone] with questions or concerns regarding your invoice.   IF you received labwork today, you will receive an invoice from [facility]. Please contact [facility] at [phone] with questions or concerns regarding your invoice.   Our billing staff  will not be able to assist you with questions regarding bills from these companies.  You will be contacted with the lab results as soon as they are available. The fastest way to get your results is to activate your My Chart account. Instructions are located on the last page of this paperwork. If you have not heard from [place] regarding the results in 2 weeks, please contact this office.     Acute Bronchitis, Adult Acute bronchitis is when air tubes (bronchi) in the lungs suddenly get swollen. The condition can make it hard to breathe. It can also cause these symptoms:  A cough.  Coughing up clear, yellow, or green mucus.  Wheezing.  Chest congestion.  Shortness of breath.  A fever.  Body aches.  Chills.  A sore throat.  Follow these instructions at home: Medicines  Take over-the-counter and prescription medicines only as told by your doctor.  If you were prescribed an antibiotic medicine, take it as told by your doctor. Do not stop taking the antibiotic even if you start to feel better. General instructions  Rest.  Drink enough fluids to keep your pee (urine) clear or pale yellow.  Avoid smoking and secondhand smoke. If you smoke and you need help quitting, ask your doctor. Quitting will help your lungs heal faster.  Use an inhaler, cool mist vaporizer, or humidifier as told by your doctor.  Keep all follow-up visits as told by your doctor. This is important. How is this prevented? To lower your risk of getting this condition again:  Wash your hands often with soap and water. If you cannot use soap and water, use hand sanitizer.  Avoid contact with people who have cold symptoms.  Try not to touch your hands to your mouth, nose, or eyes.  Make sure to get the flu shot every year.  Contact a doctor if:  Your symptoms do not get better in 2 weeks. Get help right away if:  You cough up blood.  You have chest pain.  You have very bad shortness of breath.  You  become dehydrated.  You faint (pass out) or keep feeling like you are going to pass out.  You keep throwing up (vomiting).  You have a very bad headache.  Your fever or chills gets worse. This information is not intended to replace advice given to you by your health care  provider. Make sure you discuss any questions you have with your health care provider. Document Released: 05/19/2008 Document Revised: 07/09/2016 Document Reviewed: 05/21/2016 Elsevier Interactive Patient Education  2018 Elsevier Inc.     Agustina Caroli, MD [facility]

## 2017-12-16 ENCOUNTER — Other Ambulatory Visit: Payer: Self-pay | Admitting: Family Medicine

## 2017-12-16 DIAGNOSIS — E1169 Type 2 diabetes mellitus with other specified complication: Secondary | ICD-10-CM

## 2017-12-16 DIAGNOSIS — E669 Obesity, unspecified: Principal | ICD-10-CM

## 2017-12-17 ENCOUNTER — Other Ambulatory Visit (INDEPENDENT_AMBULATORY_CARE_PROVIDER_SITE_OTHER): Payer: BC Managed Care – PPO

## 2017-12-17 DIAGNOSIS — E1169 Type 2 diabetes mellitus with other specified complication: Secondary | ICD-10-CM

## 2017-12-17 DIAGNOSIS — E669 Obesity, unspecified: Secondary | ICD-10-CM | POA: Diagnosis not present

## 2017-12-17 LAB — HEMOGLOBIN A1C: Hgb A1c MFr Bld: 8.2 % — ABNORMAL HIGH (ref 4.6–6.5)

## 2017-12-22 ENCOUNTER — Ambulatory Visit: Payer: BC Managed Care – PPO | Admitting: Family Medicine

## 2017-12-22 ENCOUNTER — Encounter: Payer: Self-pay | Admitting: Family Medicine

## 2017-12-22 VITALS — BP 138/78 | HR 88 | Temp 98.4°F | Wt 326.0 lb

## 2017-12-22 DIAGNOSIS — R059 Cough, unspecified: Secondary | ICD-10-CM

## 2017-12-22 DIAGNOSIS — R05 Cough: Secondary | ICD-10-CM

## 2017-12-22 DIAGNOSIS — E669 Obesity, unspecified: Secondary | ICD-10-CM | POA: Diagnosis not present

## 2017-12-22 DIAGNOSIS — E1169 Type 2 diabetes mellitus with other specified complication: Secondary | ICD-10-CM

## 2017-12-22 DIAGNOSIS — E7800 Pure hypercholesterolemia, unspecified: Secondary | ICD-10-CM

## 2017-12-22 MED ORDER — BENZONATATE 200 MG PO CAPS: 200.0000 mg | ORAL_CAPSULE | Freq: Three times a day (TID) | ORAL | 1 refills | Status: DC | PRN

## 2017-12-22 NOTE — Assessment & Plan Note (Signed)
Use the tessalon pills for the cough.  Stop the afrin.  Lungs ctab, he feels better.

## 2017-12-22 NOTE — Assessment & Plan Note (Signed)
He'll work on diet and weight.  No change in meds at this point.  He has historically been able to improve A1c after the holidays, with summertime A1cs better.   Recheck in about 3-4 months with labs ahead of time.  He agrees.

## 2017-12-22 NOTE — Assessment & Plan Note (Signed)
Okay to start atorvastatin- if having aches then stop it and notify me.

## 2017-12-22 NOTE — Patient Instructions (Addendum)
Do the best you can on your diet.  Use the tessalon pills for the cough.  Stop the afrin.  Okay to start atorvastatin- if you have aches then stop it and notify me.  Recheck in about 3-4 months with labs ahead of time.  Take care.  Glad to see you.

## 2017-12-22 NOTE — Progress Notes (Signed)
He is starting to get better from recent cough.  Recently seen at [facility].  Done with abx at this point.  D/w pt.  Still with some cough triggered by cold air but overall better.    Diabetes:  Using medications without difficulties: yes, at [place] tolerated dose of metformin Hypoglycemic episodes: no Hyperglycemic episodes: no Feet problems: no Blood Sugars averaging: not checked often but last ~130.   eye exam within last year: pending, he'll reschedule.   A1c worse, d/w pt.  Flu shot was done at work.   His father is dying on hospice and he had sig diet upheaval with the holidays.  D/w pt.  Condolences offered re: his father.    He hasn't started statin yet, but would be okay to start in the near future, d/w pt.   Meds, vitals, and allergies reviewed.  ROS: Per HPI unless specifically indicated in ROS section   GEN: nad, alert and oriented HEENT: mucous membranes moist NECK: supple w/o LA CV: rrr. PULM: ctab, no inc wob ABD: soft, +bs EXT: no edema

## 2018-04-04 ENCOUNTER — Other Ambulatory Visit: Payer: Self-pay | Admitting: Family Medicine

## 2018-04-04 DIAGNOSIS — E669 Obesity, unspecified: Principal | ICD-10-CM

## 2018-04-04 DIAGNOSIS — E1169 Type 2 diabetes mellitus with other specified complication: Secondary | ICD-10-CM

## 2018-04-07 ENCOUNTER — Other Ambulatory Visit (INDEPENDENT_AMBULATORY_CARE_PROVIDER_SITE_OTHER): Payer: BC Managed Care – PPO

## 2018-04-07 DIAGNOSIS — E669 Obesity, unspecified: Secondary | ICD-10-CM | POA: Diagnosis not present

## 2018-04-07 DIAGNOSIS — E1169 Type 2 diabetes mellitus with other specified complication: Secondary | ICD-10-CM | POA: Diagnosis not present

## 2018-04-07 LAB — HEMOGLOBIN A1C: HEMOGLOBIN A1C: 8.2 % — AB (ref 4.6–6.5)

## 2018-04-09 ENCOUNTER — Ambulatory Visit (INDEPENDENT_AMBULATORY_CARE_PROVIDER_SITE_OTHER): Payer: BC Managed Care – PPO | Admitting: Family Medicine

## 2018-04-09 ENCOUNTER — Encounter: Payer: Self-pay | Admitting: Family Medicine

## 2018-04-09 VITALS — BP 124/70 | HR 91 | Temp 98.5°F | Ht 69.75 in | Wt 322.8 lb

## 2018-04-09 DIAGNOSIS — M25569 Pain in unspecified knee: Secondary | ICD-10-CM | POA: Diagnosis not present

## 2018-04-09 DIAGNOSIS — E1169 Type 2 diabetes mellitus with other specified complication: Secondary | ICD-10-CM | POA: Diagnosis not present

## 2018-04-09 DIAGNOSIS — E669 Obesity, unspecified: Secondary | ICD-10-CM | POA: Diagnosis not present

## 2018-04-09 MED ORDER — SILDENAFIL CITRATE 20 MG PO TABS: 100.0000 mg | ORAL_TABLET | Freq: Every day | ORAL | 12 refills | Status: DC | PRN

## 2018-04-09 NOTE — Patient Instructions (Addendum)
Ask about going to Dr. Bing Plume or Dr. Katy Fitch or Dr. Herbert Deaner.  All are great eye clinics.  Recheck in about 4 months with labs prior to a physical.  Take care.  Glad to see you.  Don't change your meds for now.

## 2018-04-09 NOTE — Progress Notes (Signed)
Diabetes:  Using medications without difficulties: yes Hypoglycemic episodes: no sx Hyperglycemic episodes: no sx Feet problems:no Blood Sugars averaging: not checked often.   eye exam within last year:  D/w pt.  See AVS.   A1c d/w pt.  Still 8.2.    Started on statin the meantime.  He is a little sore with med but able to take med, he can put up with it.   Small irritated area not ttp on R lateral shin.  Doesn't appear infected.  He likely scraped his leg prev working in the yard.    He had sig upheaval with settling his father's estate and selling a house, plus work, d/w pt.    He has sig R knee pain and I asked him to f/u with ortho.  He was asking about a parking sticker for when it flares up, done at [facility].   Meds, vitals, and allergies reviewed.   ROS: Per HPI unless specifically indicated in ROS section   GEN: nad, alert and oriented HEENT: mucous membranes moist NECK: supple w/o LA CV: rrr. PULM: ctab, no inc wob ABD: soft, +bs EXT: no edema SKIN: no acute rash but small irritated area on the R lateral shin that doesn't appear infected.  Walking with a limp from knee pain.

## 2018-04-11 NOTE — Assessment & Plan Note (Signed)
A1c d/w pt.  Still 8.2.   He had sig upheaval with settling his father's estate and selling a house, plus work, d/w pt.   Started on statin the meantime.  He is a little sore with med but able to take med, he can put up with it.  Would continue as is.   He will call about an eye exam.  Work on diet and exercise as best he can meantime. Recheck in about 4 months with labs prior to a physical.

## 2018-04-11 NOTE — Assessment & Plan Note (Signed)
I asked him to follow-up with orthopedics.  Form done at office visit.

## 2018-07-08 ENCOUNTER — Ambulatory Visit: Payer: Self-pay | Admitting: *Deleted

## 2018-07-08 NOTE — Telephone Encounter (Signed)
Pt's wife calling; pt present during call. Reports "Got choked while eating lunch at work.  " States felt like something lodged in throat."   Reports when tried to drink water, "Would come right back up, vomiting." During call, pt states "I think it's gone." States "Burped something up earlier." H/O GERD.  Denies any SOB.  Eating popsicle presently, no further emesis. "I feel like I'm ok now." Care advise given per protocol. Instructed to go to ED if episode reoccurs, any SOB, throat swelling.    Reason for Disposition . Pill stuck in throat or esophagus    Food at lunch  Answer Assessment - Initial Assessment Questions 1. SYMPTOM: "Are you having difficulty swallowing liquids, solids, or both?"     Both initially 2. ONSET: "When did the swallowing problems begin?"      At lunch 3. CAUSE: "What do you think is causing the problem?"      Something lodged 4. CHRONIC/RECURRENT: "Is this a new problem for you?"  If no, ask: "How long have you had this problem?" (e.g., days, weeks, months)      "Once in a while if eating a hot dog." 5. OTHER SYMPTOMS: "Do you have any other symptoms?" (e.g., difficulty breathing, sore throat, swollen tongue, chest pain)     No, did have some emesis earlier  Protocols used: SWALLOWING DIFFICULTY-A-AH

## 2018-08-17 ENCOUNTER — Other Ambulatory Visit: Payer: Self-pay | Admitting: Family Medicine

## 2018-08-17 DIAGNOSIS — E669 Obesity, unspecified: Principal | ICD-10-CM

## 2018-08-17 DIAGNOSIS — E1169 Type 2 diabetes mellitus with other specified complication: Secondary | ICD-10-CM

## 2018-08-20 ENCOUNTER — Other Ambulatory Visit (INDEPENDENT_AMBULATORY_CARE_PROVIDER_SITE_OTHER): Payer: BC Managed Care – PPO

## 2018-08-20 DIAGNOSIS — E1169 Type 2 diabetes mellitus with other specified complication: Secondary | ICD-10-CM

## 2018-08-20 DIAGNOSIS — E669 Obesity, unspecified: Secondary | ICD-10-CM | POA: Diagnosis not present

## 2018-08-20 LAB — COMPREHENSIVE METABOLIC PANEL
ALBUMIN: 3.9 g/dL (ref 3.5–5.2)
ALT: 14 U/L (ref 0–53)
AST: 14 U/L (ref 0–37)
Alkaline Phosphatase: 52 U/L (ref 39–117)
BUN: 13 mg/dL (ref 6–23)
CALCIUM: 9.1 mg/dL (ref 8.4–10.5)
CHLORIDE: 102 meq/L (ref 96–112)
CO2: 25 meq/L (ref 19–32)
Creatinine, Ser: 0.85 mg/dL (ref 0.40–1.50)
GFR: 97.42 mL/min (ref >60.00)
Glucose, Bld: 204 mg/dL — ABNORMAL HIGH (ref 70–99)
POTASSIUM: 4.2 meq/L (ref 3.5–5.1)
Sodium: 136 mEq/L (ref 135–145)
Total Bilirubin: 0.7 mg/dL (ref 0.2–1.2)
Total Protein: 7.1 g/dL (ref 6.0–8.3)

## 2018-08-20 LAB — HEMOGLOBIN A1C: Hgb A1c MFr Bld: 8.2 % — ABNORMAL HIGH (ref 4.6–6.5)

## 2018-08-20 LAB — LIPID PANEL
CHOLESTEROL: 162 mg/dL (ref 0–200)
HDL: 57.3 mg/dL (ref >39.00)
LDL CALC: 92 mg/dL (ref 0–99)
NonHDL: 104.27
TRIGLYCERIDES: 62 mg/dL (ref 0.0–149.0)
Total CHOL/HDL Ratio: 3
VLDL: 12.4 mg/dL (ref 0.0–40.0)

## 2018-08-24 ENCOUNTER — Encounter: Payer: Self-pay | Admitting: Gastroenterology

## 2018-08-24 ENCOUNTER — Encounter: Payer: Self-pay | Admitting: Family Medicine

## 2018-08-24 ENCOUNTER — Ambulatory Visit (INDEPENDENT_AMBULATORY_CARE_PROVIDER_SITE_OTHER): Payer: BC Managed Care – PPO | Admitting: Family Medicine

## 2018-08-24 VITALS — BP 130/84 | HR 84 | Temp 98.7°F | Ht 69.75 in | Wt 319.8 lb

## 2018-08-24 DIAGNOSIS — Z23 Encounter for immunization: Secondary | ICD-10-CM

## 2018-08-24 DIAGNOSIS — R1310 Dysphagia, unspecified: Secondary | ICD-10-CM

## 2018-08-24 DIAGNOSIS — E1169 Type 2 diabetes mellitus with other specified complication: Secondary | ICD-10-CM

## 2018-08-24 DIAGNOSIS — Z7189 Other specified counseling: Secondary | ICD-10-CM

## 2018-08-24 DIAGNOSIS — E7800 Pure hypercholesterolemia, unspecified: Secondary | ICD-10-CM

## 2018-08-24 DIAGNOSIS — Z0000 Encounter for general adult medical examination without abnormal findings: Secondary | ICD-10-CM

## 2018-08-24 DIAGNOSIS — M25569 Pain in unspecified knee: Secondary | ICD-10-CM

## 2018-08-24 DIAGNOSIS — H9190 Unspecified hearing loss, unspecified ear: Secondary | ICD-10-CM

## 2018-08-24 DIAGNOSIS — E669 Obesity, unspecified: Secondary | ICD-10-CM

## 2018-08-24 MED ORDER — GLIPIZIDE 5 MG PO TABS: 10.0000 mg | ORAL_TABLET | Freq: Every day | ORAL | 3 refills | Status: DC

## 2018-08-24 MED ORDER — LISINOPRIL 5 MG PO TABS: 5.0000 mg | ORAL_TABLET | Freq: Every day | ORAL | 3 refills | Status: DC

## 2018-08-24 MED ORDER — ATORVASTATIN CALCIUM 10 MG PO TABS: 10.0000 mg | ORAL_TABLET | Freq: Every day | ORAL | 3 refills | Status: DC

## 2018-08-24 MED ORDER — METFORMIN HCL 500 MG PO TABS: 500.0000 mg | ORAL_TABLET | Freq: Every day | ORAL | 3 refills | Status: DC

## 2018-08-24 NOTE — Progress Notes (Signed)
CPE- See plan.  Routine anticipatory guidance given to patient.  See health maintenance.  The possibility exists that previously documented standard health maintenance information may have been brought forward from a previous encounter into this note.  If needed, that same information has been updated to reflect the current situation based on today's encounter.    Tetanus 2018 Flu 2019 PNA 2014 Shingles out of stock.   Colonoscopy 2015 Prostate cancer screening and PSA options(with potential risks and benefits of testing vs not testing) were discussed along with recent recs/guidelines. He declined testing PSAat this point. Eye clinic eval done summer 2019- no retinopathy per patient report.   Living will d/w pt. Wife designated if patient were incapacitated.  He has some food sticking in the mid esophagus.  He can still breathe with the events.  Does better with small bites.  He'll have to let the episode pass and then he feels better.  Has been going on for the last ~1 year, but not with dx daily.  No troubles with liquids.  No blood in stool.  Not vomiting.    More trouble with hearing, esp background noise.  D/w pt.  Gradually changes.  Wife noted his hearing loss, turning the TV louder.    Diabetes:  Using medications without difficulties:yes Hypoglycemic episodes:no Hyperglycemic episodes:no Feet problems: occ episodic tingling.  Not consistent.   Blood Sugars averaging: ~140s on episodic checks eye exam within last year: yes, see above.   Social upheaval likely affecting his diet/exercise.   Labs d/w pt.   Elevated Cholesterol: Using medications without problems: yes Muscle aches: some aches, unclear if from work vs statin.  Tolerable.  D/w pt.  He'll update me as needed.   Diet compliance: encouraged.   Exercise: encouraged.  Limited by knee pain.    He is putting up with knee pain.    PMH and SH reviewed  Meds, vitals, and allergies reviewed.   ROS: Per HPI.  Unless  specifically indicated otherwise in HPI, the patient denies:  General: fever. Eyes: acute vision changes ENT: sore throat Cardiovascular: chest pain Respiratory: SOB GI: vomiting GU: dysuria Musculoskeletal: acute back pain Derm: acute rash Neuro: acute motor dysfunction Psych: worsening mood Endocrine: polydipsia Heme: bleeding Allergy: hayfever  GEN: nad, alert and oriented HEENT: mucous membranes moist NECK: supple w/o LA CV: rrr. PULM: ctab, no inc wob ABD: soft, +bs EXT: no edema SKIN: no acute rash Left ear canal with impacted cerumen.  Removed with irrigation and curette.  Recheck tympanic membrane, canal, pinna within normal limits. Right TM, canal, pinna normal.  No wax.  He felt better after left ear cerumen removal.  Diabetic foot exam: Normal inspection No skin breakdown B 1st toe calluses noted Normal DP pulses Normal sensation to light touch and monofilament Nails normal

## 2018-08-24 NOTE — Patient Instructions (Addendum)
We will call about your referrals.  Rosaria Ferries or Azalee Course will call you if you don't see one of them on the way out.  See if the pharmacy has the new shingles shot.   Increase the glipizide to 2 pills a day.  If you have any low sugars then cut back and update me.  Recheck in about 3 months.  The only lab you need to have done for your next diabetic visit is an A1c.  We can do this with a fingerstick test at the office visit.  You do not need a lab visit ahead of time for this.  It does not matter if you are fasting when the lab is done.

## 2018-08-25 DIAGNOSIS — R1310 Dysphagia, unspecified: Secondary | ICD-10-CM | POA: Insufficient documentation

## 2018-08-25 DIAGNOSIS — H9190 Unspecified hearing loss, unspecified ear: Secondary | ICD-10-CM | POA: Insufficient documentation

## 2018-08-25 NOTE — Assessment & Plan Note (Signed)
Routine cautions given.  Refer to GI.  He agrees.

## 2018-08-25 NOTE — Assessment & Plan Note (Signed)
Noted in both ears, wax removed from left ear.  Refer.

## 2018-08-25 NOTE — Assessment & Plan Note (Signed)
He is putting up with the pain as is.  Discussed with patient about weight loss.  We can refer to orthopedics if needed.

## 2018-08-25 NOTE — Assessment & Plan Note (Signed)
Tetanus 2018 Flu 2019 PNA 2014 Shingles out of stock.   Colonoscopy 2015 Prostate cancer screening and PSA options(with potential risks and benefits of testing vs not testing) were discussed along with recent recs/guidelines. He declined testing PSAat this point. Eye clinic eval done summer 2019- no retinopathy per patient report.   Living will d/w pt. Wife designated if patient were incapacitated.

## 2018-08-25 NOTE — Assessment & Plan Note (Signed)
He may have some aches from the statin but it is tolerable and he wanted to continue as is for now with statin.  This is reasonable.  Discussed diet and exercise.  Labs discussed with patient.

## 2018-08-25 NOTE — Assessment & Plan Note (Signed)
Discussed with patient about weight loss.  Discussed diet and exercise.

## 2018-08-25 NOTE — Assessment & Plan Note (Signed)
Living will d/w pt.  Wife designated if patient were incapacitated.   ?

## 2018-08-25 NOTE — Assessment & Plan Note (Signed)
Discussed diet and exercise.  Labs discussed with patient.  A1c still elevated. Increase the glipizide to 2 pills a day.  If any low sugars then cut back and update me.  Recheck in about 3 months.  Routine cautions given.  He agrees with plan.

## 2018-08-30 DIAGNOSIS — H903 Sensorineural hearing loss, bilateral: Secondary | ICD-10-CM | POA: Insufficient documentation

## 2018-08-30 DIAGNOSIS — H9313 Tinnitus, bilateral: Secondary | ICD-10-CM | POA: Insufficient documentation

## 2018-09-07 IMAGING — DX DG CHEST 2V
4 series · 4 of 4 positions shown · non-contrast
Comparison: None

CLINICAL DATA: Cough, history hypertension, type II diabetes
mellitus

EXAM:
CHEST  2 VIEW

[chest pa (1 of 2)]
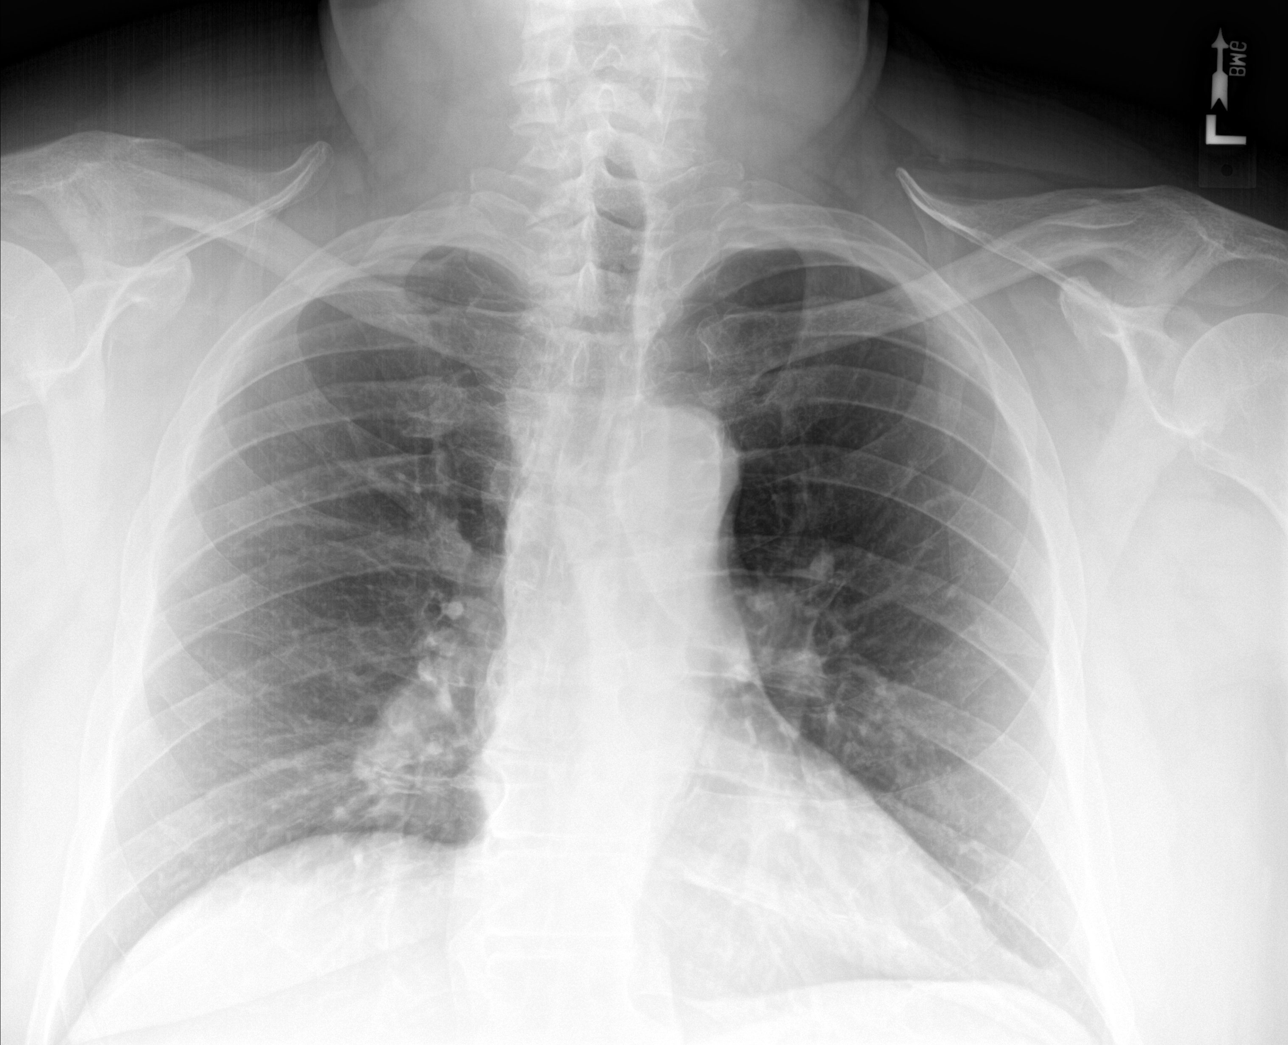

[chest lat (1 of 2)]
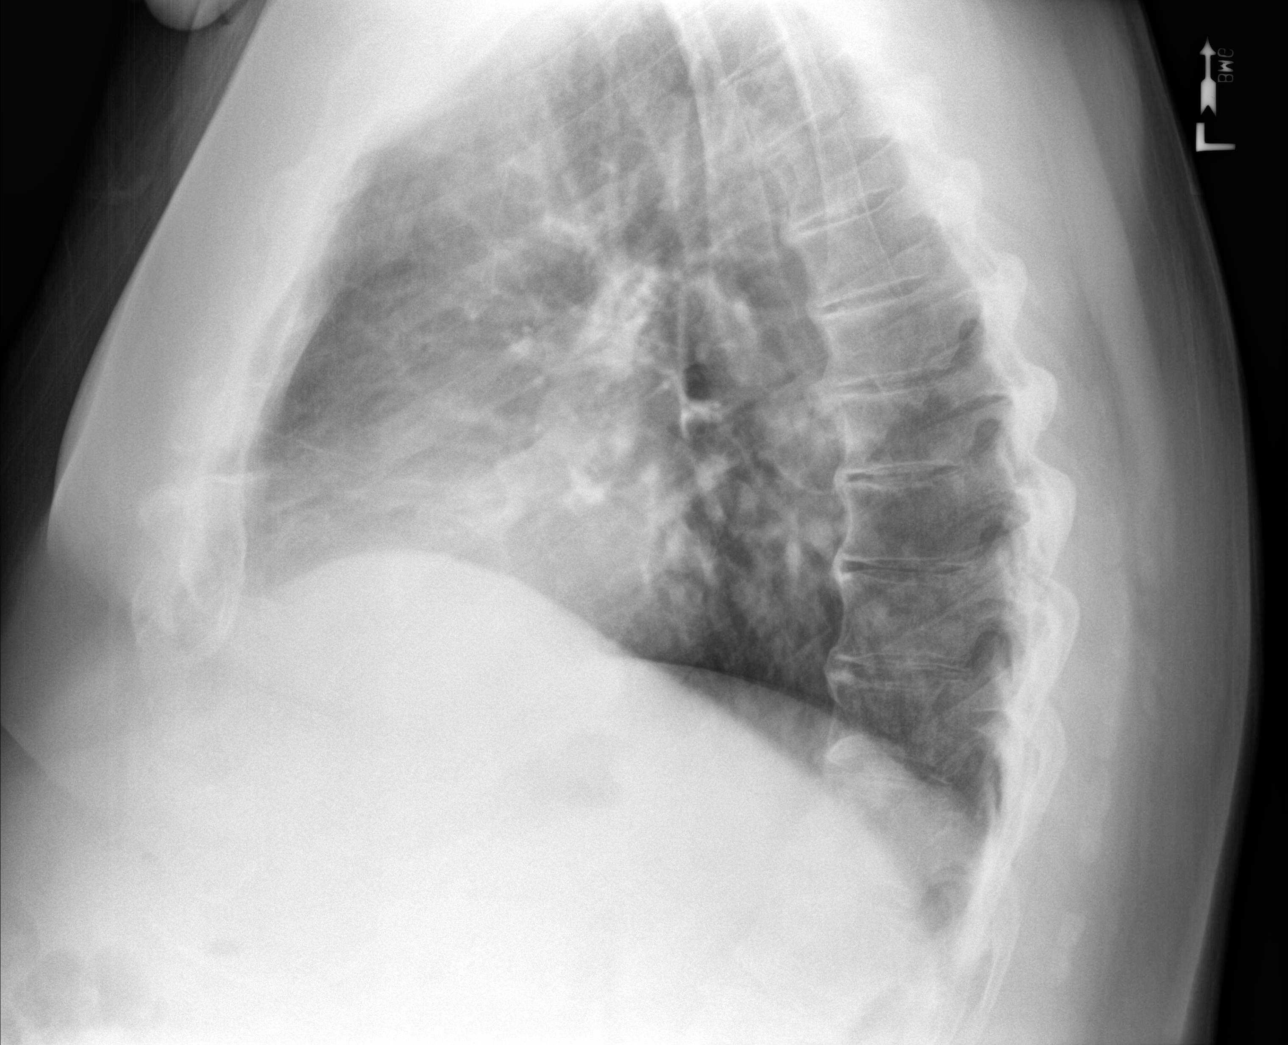

[chest pa (2 of 2)]
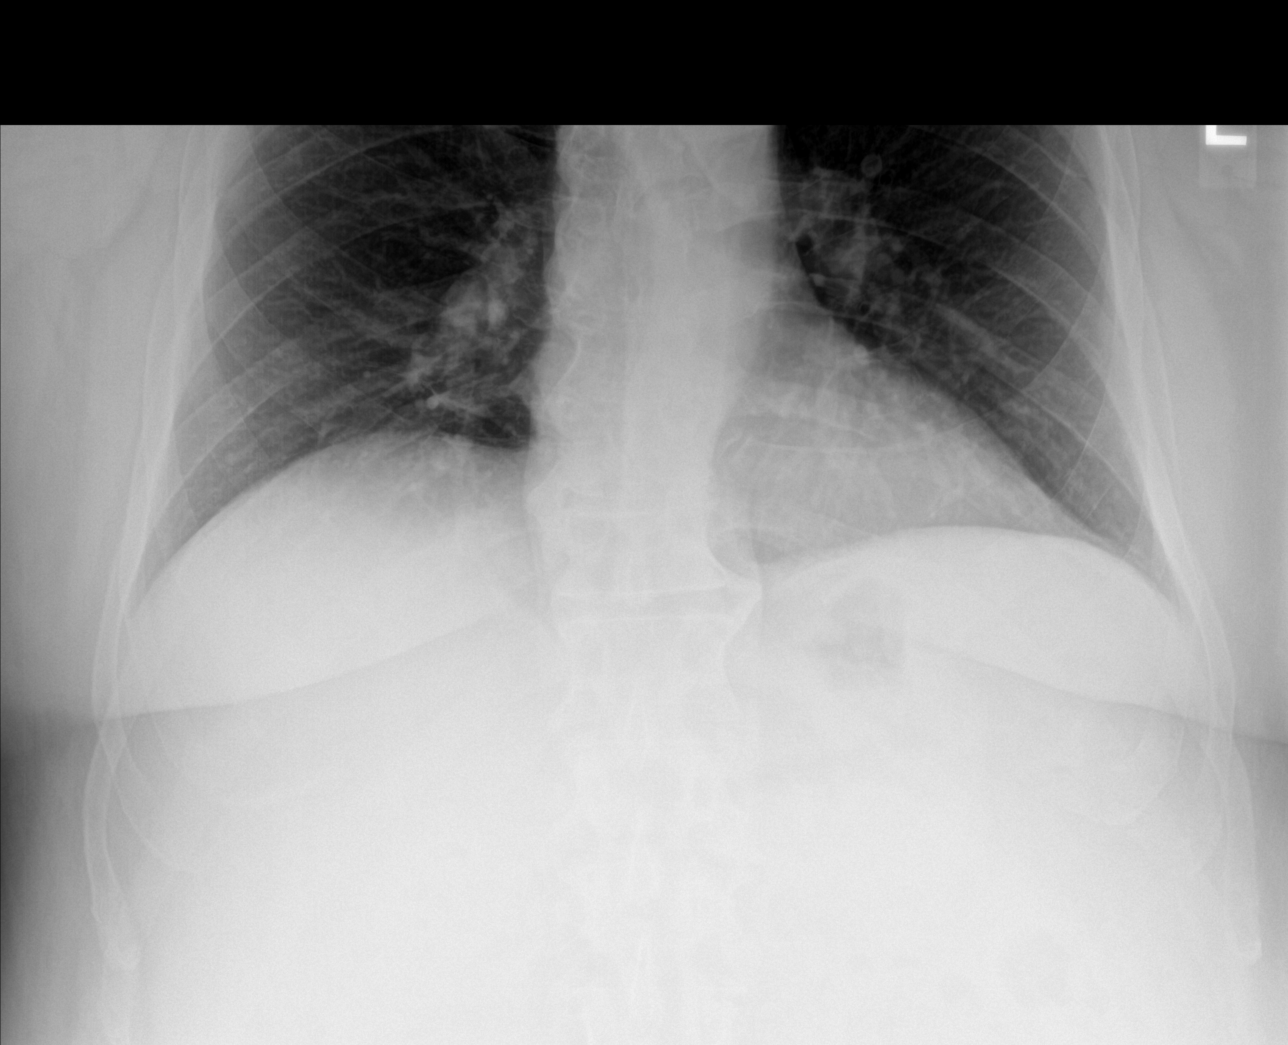

[chest lat (2 of 2)]
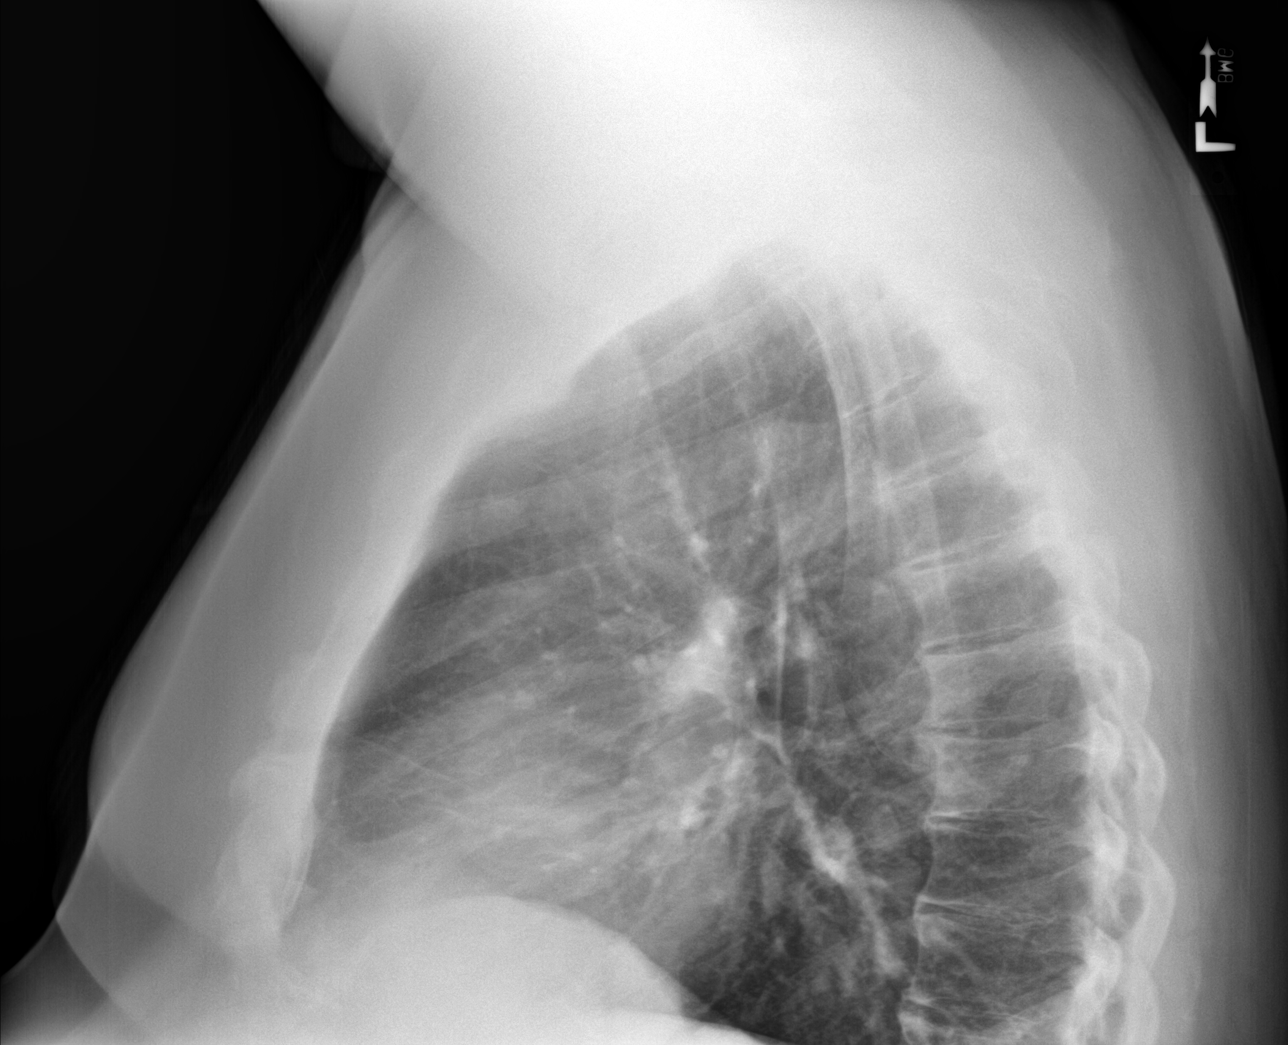

[4 of 4 positions shown; findings below may reference images not displayed]

FINDINGS: Normal heart size, mediastinal contours, and pulmonary vascularity.

Mild central peribronchial thickening.

Lungs clear.

No pleural effusion or pneumothorax.

No acute osseous findings.
IMPRESSION: Mild bronchitic changes without infiltrate.

## 2018-10-01 ENCOUNTER — Ambulatory Visit: Payer: BC Managed Care – PPO | Admitting: Gastroenterology

## 2018-10-28 ENCOUNTER — Ambulatory Visit: Payer: BC Managed Care – PPO | Admitting: Gastroenterology

## 2018-11-22 ENCOUNTER — Telehealth: Payer: Self-pay | Admitting: Gastroenterology

## 2018-11-22 ENCOUNTER — Ambulatory Visit: Payer: BC Managed Care – PPO | Admitting: Gastroenterology

## 2018-11-22 NOTE — Telephone Encounter (Signed)
No charge this time. 

## 2018-11-22 NOTE — Telephone Encounter (Signed)
Do you want to charge? 

## 2018-11-23 ENCOUNTER — Ambulatory Visit: Payer: BC Managed Care – PPO | Admitting: Family Medicine

## 2018-11-25 ENCOUNTER — Ambulatory Visit: Payer: BC Managed Care – PPO | Admitting: Family Medicine

## 2018-11-26 ENCOUNTER — Ambulatory Visit: Payer: BC Managed Care – PPO | Admitting: Family Medicine

## 2018-11-26 ENCOUNTER — Encounter: Payer: Self-pay | Admitting: Family Medicine

## 2018-11-26 VITALS — BP 144/92 | HR 92 | Temp 98.4°F | Ht 69.75 in | Wt 319.0 lb

## 2018-11-26 DIAGNOSIS — E1169 Type 2 diabetes mellitus with other specified complication: Secondary | ICD-10-CM

## 2018-11-26 DIAGNOSIS — E669 Obesity, unspecified: Secondary | ICD-10-CM

## 2018-11-26 LAB — POCT GLYCOSYLATED HEMOGLOBIN (HGB A1C): HEMOGLOBIN A1C: 7.7 % — AB (ref 4.0–5.6)

## 2018-11-26 MED ORDER — GLIPIZIDE 5 MG PO TABS: 5.0000 mg | ORAL_TABLET | Freq: Every day | ORAL | Status: DC

## 2018-11-26 NOTE — Progress Notes (Signed)
Diabetes:  Using medications without difficulties: yes, see below.  Hypoglycemic episodes: no Hyperglycemic episodes:no Feet problems:not except for rare tingling.  Blood Sugars averaging: usually ~140s eye exam within last year: yes, summer of 2019.  No retinopathy per patient report.   He has been taking 5mg  glipizide, instead of 10mg  a day.   A1c 7.7.   D/w pt.   He has been working on diet and exercise.   He is clearly better with less aches off atorvastatin.  Med list updated.    He was use OTC hemp cream on his joints.  That seemed to help a lot.  He had a possible bug bite on his back about a month ago.  He had some residual skin changes that he wanted checked.  Meds, vitals, and allergies reviewed.   ROS: Per HPI unless specifically indicated in ROS section   GEN: nad, alert and oriented HEENT: mucous membranes moist NECK: supple w/o LA CV: rrr. PULM: ctab, no inc wob ABD: soft, +bs EXT: no edema SKIN: no acute rash but postinflammatory pigmentation changes at possible insect bite (about 1 month ago) on the back.  Diabetic foot exam: Normal inspection No skin breakdown No calluses  Normal DP pulses Normal sensation to light touch and monofilament Nails normal  See after visit summary.

## 2018-11-26 NOTE — Patient Instructions (Addendum)
Don't change your meds for now.  If your sugar is going up, consistently >150, then increase the glipizide to 2 day.  You can back down if needed.    Recheck in about 3 months.  The only lab you need to have done for your next diabetic visit is an A1c.  We can do this with a fingerstick test at the office visit.  You do not need a lab visit ahead of time for this.  It does not matter if you are fasting when the lab is done.    Thanks for your effort.  Take care.  Glad to see you.

## 2018-11-28 NOTE — Assessment & Plan Note (Signed)
He has been taking 5mg  glipizide, instead of 10mg  a day.   A1c 7.7.   D/w pt.  Would continue as is for now with glipizide.  He will see what he can do with diet and exercise and we can recheck his A1c later on.  He is clearly better with less aches off atorvastatin.  Med list updated  Skin changes on his back are incidental and unremarkable with no follow-up needed on that.

## 2018-12-16 ENCOUNTER — Encounter: Payer: Self-pay | Admitting: Internal Medicine

## 2018-12-16 ENCOUNTER — Ambulatory Visit: Payer: BC Managed Care – PPO | Admitting: Internal Medicine

## 2018-12-16 VITALS — BP 138/86 | HR 88 | Temp 98.4°F | Wt 317.0 lb

## 2018-12-16 DIAGNOSIS — J00 Acute nasopharyngitis [common cold]: Secondary | ICD-10-CM

## 2018-12-16 MED ORDER — AZITHROMYCIN 250 MG PO TABS: ORAL_TABLET | ORAL | 0 refills | Status: DC

## 2018-12-16 MED ORDER — BENZONATATE 200 MG PO CAPS: 200.0000 mg | ORAL_CAPSULE | Freq: Two times a day (BID) | ORAL | 0 refills | Status: DC | PRN

## 2018-12-16 MED ORDER — HYDROCODONE-HOMATROPINE 5-1.5 MG/5ML PO SYRP: 5.0000 mL | ORAL_SOLUTION | Freq: Every evening | ORAL | 0 refills | Status: DC | PRN

## 2018-12-16 NOTE — Progress Notes (Signed)
HPI  Pt presents to the clinic today with c/o headache, facial pressure, nasal congestion and cough. He reports this started 5-6 days ago. He is blowing yellow/green mucous out of his nose. The cough is productive of yellow/green mucous. He denies ear pain, sore throat or shortness of breath. He denies fever, chills or body aches. He has tried Human resources officer, Flonase, Dayquil and Nyquil with minimal relief. He has a history of allergies and DM2. He has had sick contacts.  Review of Systems      Past Medical History:  Diagnosis Date  . Allergy   . HTN (hypertension)   . Tubular adenoma of colon 2010  . Type II or unspecified type diabetes mellitus without mention of complication, not stated as uncontrolled     Family History  Problem Relation Age of Onset  . Alzheimer's disease Mother   . Diabetes Mother   . Hypertension Other   . Cancer Other        Leukemia  . Sleep apnea Sister   . Diabetes Maternal Uncle   . Depression Maternal Uncle   . Stroke Maternal Grandfather   . Colon cancer Neg Hx   . Prostate cancer Neg Hx     Social History   Socioeconomic History  . Marital status: Married    Spouse name: Not on file  . Number of children: 2  . Years of education: Not on file  . Highest education level: Not on file  Occupational History  . Occupation: Environmental manager: [employer]  Social Needs  . Financial resource strain: Not on file  . Food insecurity:    Worry: Not on file    Inability: Not on file  . Transportation needs:    Medical: Not on file    Non-medical: Not on file  Tobacco Use  . Smoking status: Never Smoker  . Smokeless tobacco: Never Used  Substance and Sexual Activity  . Alcohol use: Yes    Alcohol/week: 0.0 standard drinks    Comment: very rare use  . Drug use: No  . Sexual activity: Not on file  Lifestyle  . Physical activity:    Days per week: Not on file    Minutes per session: Not on file  . Stress: Not on file   Relationships  . Social connections:    Talks on phone: Not on file    Gets together: Not on file    Attends religious service: Not on file    Active member of club or organization: Not on file    Attends meetings of clubs or organizations: Not on file    Relationship status: Not on file  . Intimate partner violence:    Fear of current or ex partner: Not on file    Emotionally abused: Not on file    Physically abused: Not on file    Forced sexual activity: Not on file  Other Topics Concern  . Not on file  Social History Narrative   Married 1988   2 sons   Government social research officer for [employer]    Allergies  Allergen Reactions  . Atorvastatin Other (See Comments)    Joint aches  . Erythromycin Nausea Only  . Metformin And Related Other (See Comments)    Rasul tolerated dose is 500mg  a day.   . Oxycodone     nausea     Constitutional: Positive headache. Denies fatigue, fever or abrupt weight changes.  HEENT:  Positive nasal congestion.  Denies eye redness, eye pain, pressure behind the eyes, facial pain, ear pain, ringing in the ears, wax buildup, runny nose or sore throat. Respiratory: Positive cough. Denies difficulty breathing or shortness of breath.  Cardiovascular: Denies chest pain, chest tightness, palpitations or swelling in the hands or feet.   No other specific complaints in a complete review of systems (except as listed in HPI above).  Objective:   BP 138/86   Pulse 88   Temp 98.4 F (36.9 C) (Oral)   Wt (!) 317 lb (143.8 kg)   SpO2 98%   BMI 45.81 kg/m   Wt Readings from Last 3 Encounters:  12/16/18 (!) 317 lb (143.8 kg)  11/26/18 (!) 319 lb (144.7 kg)  08/24/18 (!) 319 lb 12 oz (145 kg)     General: Appears his stated age, in NAD. HEENT: Head: normal shape and size, mild frontal sinus tenderness noted;Ears: Tm's gray and intact, normal light reflex; Nose: mucosa pink and moist, septum midline; Throat/Mouth: + PND. Teeth present, mucosa pink  and moist, no exudate noted, no lesions or ulcerations noted.  Neck: No cervical lymphadenopathy.  Cardiovascular: Normal rate and rhythm. S1,S2 noted.  No murmur, rubs or gallops noted.  Pulmonary/Chest: Normal effort and positive vesicular breath sounds. No respiratory distress. No wheezes, rales or ronchi noted.       Assessment & Plan:   Upper Respiratory Infection:  Get some rest and drink plenty of water Do salt water gargles for the sore throat eRx for Azithromax x 5 days eRx for Tessalon 200 mg TID prn eRx for Hycodan cough syrup  RTC as needed or if symptoms persist.   Webb Silversmith, NP

## 2018-12-16 NOTE — Patient Instructions (Signed)

## 2018-12-17 ENCOUNTER — Telehealth: Payer: Self-pay

## 2018-12-17 NOTE — Telephone Encounter (Signed)
Pt seen 12/16/18; pt wants to know that he did not get paperwork at end of visit and pt wants to know his dx. Advised dx URI. Pt needs work note excuse from 12/13/18 - 12/17/18. Include return to work on 12/20/18. Pt request work excuse mailed to verified home address.

## 2018-12-17 NOTE — Telephone Encounter (Signed)
I don't back date notes. Ok for note from 1/2-1/6

## 2018-12-20 NOTE — Telephone Encounter (Signed)
Left message on voicemail.

## 2018-12-20 NOTE — Telephone Encounter (Signed)
Left message on voicemail wanted to make sure pt was aware that Rollene F[employer] is not going to honor all requested dates as well as mailing will take a while with our process and to make sure he did not want to pick it up vs mailing.[name] KitchenMarland Kitchen

## 2018-12-21 NOTE — Telephone Encounter (Signed)
Pt called office and voiced understanding of Rollene Fare not being able to honor all dates. Pt said he will come pick it up instead.

## 2018-12-22 ENCOUNTER — Encounter: Payer: Self-pay | Admitting: *Deleted

## 2018-12-22 NOTE — Telephone Encounter (Signed)
Lm on pts vm and advised letter is available for pick up at the front desk

## 2018-12-28 ENCOUNTER — Ambulatory Visit: Payer: BC Managed Care – PPO | Admitting: Gastroenterology

## 2019-01-21 ENCOUNTER — Encounter: Payer: Self-pay | Admitting: Gastroenterology

## 2019-01-21 ENCOUNTER — Ambulatory Visit: Payer: BC Managed Care – PPO | Admitting: Gastroenterology

## 2019-01-21 VITALS — BP 124/86 | HR 78 | Ht 69.0 in | Wt 320.0 lb

## 2019-01-21 DIAGNOSIS — Z86010 Personal history of colonic polyps: Secondary | ICD-10-CM

## 2019-01-21 DIAGNOSIS — R1310 Dysphagia, unspecified: Secondary | ICD-10-CM | POA: Diagnosis not present

## 2019-01-21 DIAGNOSIS — R1319 Other dysphagia: Secondary | ICD-10-CM

## 2019-01-21 NOTE — Progress Notes (Signed)
History of Present Illness: This is a 63 year old male referred by Tonia Ghent, MD for the evaluation of dysphagia.  He is accompanied by his wife.  He relates a 87-month history of intermittent solid food dysphagia.  He has noted problems with apples, hotdogs and bread.  By chewing his food more carefully his symptoms have improved.  He has rare episodes of nocturnal reflux when he eats late at night.  No other gastrointestinal complaints. Denies weight loss, abdominal pain, constipation, diarrhea, change in stool caliber, melena, hematochezia, nausea, vomiting, chest pain.     Allergies  Allergen Reactions  . Atorvastatin Other (See Comments)    Joint aches  . Erythromycin Nausea Only  . Metformin And Related Other (See Comments)    Jhoan tolerated dose is 500mg  a day.   . Oxycodone     nausea   Outpatient Medications Prior to Visit  Medication Sig Dispense Refill  . fexofenadine (ALLEGRA) 180 MG tablet Take 180 mg by mouth daily.    [name] Kitchen glipiZIDE (GLUCOTROL) 5 MG tablet Take 1-2 tablets (5-10 mg total) by mouth daily before breakfast.    . Ibuprofen (ADVIL) 200 MG CAPS Take by mouth as needed.    [name] Kitchen lisinopril (PRINIVIL,ZESTRIL) 5 MG tablet Take 1 tablet (5 mg total) by mouth daily. 90 tablet 3  . metFORMIN (GLUCOPHAGE) 500 MG tablet Take 1 tablet (500 mg total) by mouth daily with breakfast. 90 tablet 3  . sildenafil (REVATIO) 20 MG tablet Take 5 tablets (100 mg total) by mouth daily as needed. 50 tablet 12  . azithromycin (ZITHROMAX) 250 MG tablet Take 2 tabs today, then 1 tab daily x 4 days 6 tablet 0  . benzonatate (TESSALON) 200 MG capsule Take 1 capsule (200 mg total) by mouth 2 (two) times daily as needed for cough. 30 capsule 0  . fluticasone (FLONASE) 50 MCG/ACT nasal spray Place 2 sprays into both nostrils daily as needed for allergies or rhinitis.    [name] Kitchen HYDROcodone-homatropine (HYCODAN) 5-1.5 MG/5ML syrup Take 5 mLs by mouth at bedtime as needed for cough. 120 mL 0    No facility-administered medications prior to visit.    Past Medical History:  Diagnosis Date  . Allergy   . HTN (hypertension)   . Tubular adenoma of colon 2010  . Type II or unspecified type diabetes mellitus without mention of complication, not stated as uncontrolled    Past Surgical History:  Procedure Laterality Date  . KNEE ARTHROSCOPY Right 2014  . VASECTOMY  1997   Dr. Reece Agar   Social History   Socioeconomic History  . Marital status: Married    Spouse name: Not on file  . Number of children: 2  . Years of education: Not on file  . Highest education level: Not on file  Occupational History  . Occupation: Environmental manager: [employer]  Social Needs  . Financial resource strain: Not on file  . Food insecurity:    Worry: Not on file    Inability: Not on file  . Transportation needs:    Medical: Not on file    Non-medical: Not on file  Tobacco Use  . Smoking status: Never Smoker  . Smokeless tobacco: Never Used  Substance and Sexual Activity  . Alcohol use: Yes    Alcohol/week: 0.0 standard drinks    Comment: very rare use  . Drug use: No  . Sexual activity: Not on file  Lifestyle  . Physical  activity:    Days per week: Not on file    Minutes per session: Not on file  . Stress: Not on file  Relationships  . Social connections:    Talks on phone: Not on file    Gets together: Not on file    Attends religious service: Not on file    Active member of club or organization: Not on file    Attends meetings of clubs or organizations: Not on file    Relationship status: Not on file  Other Topics Concern  . Not on file  Social History Narrative   Married 1988   2 sons   Government social research officer for [employer]   Family History  Problem Relation Age of Onset  . Alzheimer's disease Mother   . Diabetes Mother   . Hypertension Other   . Cancer Other        Leukemia  . Sleep apnea Sister   . Diabetes Maternal Uncle   .  Depression Maternal Uncle   . Stroke Maternal Grandfather   . Colon cancer Neg Hx   . Prostate cancer Neg Hx        Review of Systems: Pertinent positive and negative review of systems were noted in the above HPI section. All other review of systems were otherwise negative.    Physical Exam: General: Well developed, well nourished, obese, no acute distress Head: Normocephalic and atraumatic Eyes:  sclerae anicteric, EOMI Ears: Normal auditory acuity Mouth: No deformity or lesions Neck: Supple, no masses or thyromegaly Lungs: Clear throughout to auscultation Heart: Regular rate and rhythm; no murmurs, rubs or bruits Abdomen: Soft, non tender and non distended. No masses, hepatosplenomegaly or hernias noted. Normal Bowel sounds Rectal: Not done Musculoskeletal: Symmetrical with no gross deformities  Skin: No lesions on visible extremities Pulses:  Normal pulses noted Extremities: No clubbing, cyanosis, edema or deformities noted Neurological: Alert oriented x 4, grossly nonfocal Cervical Nodes:  No significant cervical adenopathy Inguinal Nodes: No significant inguinal adenopathy Psychological:  Alert and cooperative. Normal mood and affect   Assessment and Recommendations:  1. Dysphagia to solid foods.  Rule out esophageal stricture, GERD, esophagitis, less likely a motility disorder or a neoplasm.  Schedule barium esophagram with tablet and EGD with possible dilation. The risks (including bleeding, perforation, infection, missed lesions, medication reactions and possible hospitalization or surgery if complications occur), benefits, and alternatives to endoscopy with possible biopsy and possible dilation were discussed with the patient and they consent to proceed.   2.  Personal history of adenomatous colon polyps.  A 5-year interval surveillance colonoscopy is recommended in May 2020 with a more extensive bowel prep.    cc: Tonia Ghent, MD [address] [place], [place] [id number]

## 2019-01-21 NOTE — Patient Instructions (Signed)
You have been scheduled for a Barium Esophogram at [facility] (1st floor of the hospital) on 01/31/19 at 9:30am. Please arrive 15 minutes prior to your appointment for registration. Make certain not to have anything to eat or drink 3 hours prior to your test. If you need to reschedule for any reason, please contact radiology at [phone] to do so. __________________________________________________________________ A barium swallow is an examination that concentrates on views of the esophagus. This tends to be a double contrast exam (barium and two liquids which, when combined, create a gas to distend the wall of the oesophagus) or single contrast (non-ionic iodine based). The study is usually tailored to your symptoms so a good history is essential. Attention is paid during the study to the form, structure and configuration of the esophagus, looking for functional disorders (such as aspiration, dysphagia, achalasia, motility and reflux) EXAMINATION You may be asked to change into a gown, depending on the type of swallow being performed. A radiologist and radiographer will perform the procedure. The radiologist will advise you of the type of contrast selected for your procedure and direct you during the exam. You will be asked to stand, sit or lie in several different positions and to hold a small amount of fluid in your mouth before being asked to swallow while the imaging is performed .In some instances you may be asked to swallow barium coated marshmallows to assess the motility of a solid food bolus. The exam can be recorded as a digital or video fluoroscopy procedure. POST PROCEDURE It will take 1-2 days for the barium to pass through your system. To facilitate this, it is important, unless otherwise directed, to increase your fluids for the next 24-48hrs and to resume your normal diet.  This test typically takes about 30 minutes to  perform. __________________________________________________________________ Brett Stanley have been scheduled for an endoscopy. Please follow written instructions given to you at your visit today. If you use inhalers (even only as needed), please bring them with you on the day of your procedure. Your physician has requested that you go to www.startemmi.com and enter the access code given to you at your visit today. This web site gives a general overview about your procedure. However, you should still follow specific instructions given to you by our office regarding your preparation for the procedure.  Normal BMI (Body Mass Index- based on height and weight) is between 19 and 25. Your BMI today is Body mass index is 47.26 kg/m. [name] Kitchen Please consider follow up  regarding your BMI with your Primary Care Provider.   Thank you for choosing me and [facility].  Pricilla Riffle. Dagoberto Ligas., MD., Marval Regal

## 2019-01-24 ENCOUNTER — Telehealth: Payer: Self-pay | Admitting: Gastroenterology

## 2019-01-24 NOTE — Telephone Encounter (Signed)
Patient provided the number to radiology to cancel and reschedule at his convenience

## 2019-01-28 ENCOUNTER — Ambulatory Visit: Payer: BC Managed Care – PPO | Admitting: Family Medicine

## 2019-01-28 ENCOUNTER — Encounter: Payer: Self-pay | Admitting: Family Medicine

## 2019-01-28 DIAGNOSIS — R6889 Other general symptoms and signs: Secondary | ICD-10-CM | POA: Diagnosis not present

## 2019-01-28 MED ORDER — OSELTAMIVIR PHOSPHATE 75 MG PO CAPS: 75.0000 mg | ORAL_CAPSULE | Freq: Two times a day (BID) | ORAL | 0 refills | Status: DC

## 2019-01-28 MED ORDER — HYDROCODONE-HOMATROPINE 5-1.5 MG/5ML PO SYRP: 5.0000 mL | ORAL_SOLUTION | Freq: Three times a day (TID) | ORAL | 0 refills | Status: DC | PRN

## 2019-01-28 MED ORDER — BENZONATATE 200 MG PO CAPS: 200.0000 mg | ORAL_CAPSULE | Freq: Three times a day (TID) | ORAL | 1 refills | Status: DC | PRN

## 2019-01-28 NOTE — Progress Notes (Signed)
duration of symptoms:minimal cough 2 nights ago, then clearly worse last night.   Rhinorrhea: mild  Congestion: yes ear pain: no sore throat: no  Cough: sig dry cough.  Chest is sore from coughing.   Myalgias: in the thorax.   Had fevers and sweats.    His son tested positive for flu yesterday.    Per HPI unless specifically indicated in ROS section   Meds, vitals, and allergies reviewed.   GEN: nad, alert and oriented HEENT: mucous membranes moist, TM w/o erythema, nasal epithelium injected, OP with cobblestoning NECK: supple w/o LA CV: rrr. PULM: ctab, no inc wob ABD: soft, +bs EXT: no edema

## 2019-01-28 NOTE — Patient Instructions (Signed)
Presumed flu.  Start tamiflu.  Rest and fluids.   Tessalon for cough- pills.  Then use hycodan if needed- liquid.  Sedation caution.  Take care.  Glad to see you.

## 2019-01-30 DIAGNOSIS — R6889 Other general symptoms and signs: Secondary | ICD-10-CM | POA: Insufficient documentation

## 2019-01-30 NOTE — Assessment & Plan Note (Signed)
Presumed flu.  Start tamiflu.  Rest and fluids.  I did not test patient for flu since we would treat him regardless of results, he understood and agreed. Tessalon for cough. Then use hycodan if needed.  Sedation caution.  Okay for outpatient follow-up.  He agrees with plan

## 2019-01-31 ENCOUNTER — Ambulatory Visit (HOSPITAL_COMMUNITY): Admission: RE | Admit: 2019-01-31 | Payer: BC Managed Care – PPO | Source: Ambulatory Visit

## 2019-02-28 ENCOUNTER — Other Ambulatory Visit: Payer: Self-pay

## 2019-02-28 ENCOUNTER — Encounter: Payer: Self-pay | Admitting: Family Medicine

## 2019-02-28 ENCOUNTER — Ambulatory Visit: Payer: BC Managed Care – PPO | Admitting: Family Medicine

## 2019-02-28 VITALS — BP 142/88 | HR 97 | Temp 98.7°F | Ht 69.0 in | Wt 312.6 lb

## 2019-02-28 DIAGNOSIS — E119 Type 2 diabetes mellitus without complications: Secondary | ICD-10-CM

## 2019-02-28 DIAGNOSIS — E1169 Type 2 diabetes mellitus with other specified complication: Secondary | ICD-10-CM

## 2019-02-28 DIAGNOSIS — E669 Obesity, unspecified: Secondary | ICD-10-CM | POA: Diagnosis not present

## 2019-02-28 LAB — POCT GLYCOSYLATED HEMOGLOBIN (HGB A1C): Hemoglobin A1C: 7.8 % — AB (ref 4.0–5.6)

## 2019-02-28 NOTE — Progress Notes (Signed)
Diabetes:  Using medications without difficulties: yes Hypoglycemic episodes:no Hyperglycemic episodes:no Feet problems:no Blood Sugars averaging: usually ~130s eye exam within last year: done last summer, has f/u pending for summer 2020.  A1c 7.8.  Similar to prev.  D/w pt.  He is walking and his weight is down.  D/w pt.    Meds, vitals, and allergies reviewed.  ROS: Per HPI unless specifically indicated in ROS section   GEN: nad, alert and oriented HEENT: mucous membranes moist NECK: supple w/o LA CV: rrr. PULM: ctab, no inc wob ABD: soft, +bs EXT: no edema SKIN: well perfused.

## 2019-02-28 NOTE — Patient Instructions (Addendum)
Thank you for your effort.   Don't change your meds for now.  Recheck in about 3 months.  A1c at the visit.  With continued work on weight loss, your A1c should improve without extra meds.  Take care.  Glad to see you.

## 2019-03-01 ENCOUNTER — Encounter: Payer: BC Managed Care – PPO | Admitting: Gastroenterology

## 2019-03-02 NOTE — Assessment & Plan Note (Signed)
A1c 7.8.  Similar to prev.  D/w pt.  He is walking and his weight is down.  D/w pt.   No change in meds for now.   Recheck in about 3 months.  A1c at the visit.  With continued work on weight loss, A1c should improve without extra meds.

## 2019-04-12 ENCOUNTER — Encounter: Payer: Self-pay | Admitting: Family Medicine

## 2019-04-12 ENCOUNTER — Ambulatory Visit (INDEPENDENT_AMBULATORY_CARE_PROVIDER_SITE_OTHER): Payer: BC Managed Care – PPO | Admitting: Family Medicine

## 2019-04-12 ENCOUNTER — Telehealth: Payer: Self-pay

## 2019-04-12 DIAGNOSIS — L608 Other nail disorders: Secondary | ICD-10-CM

## 2019-04-12 NOTE — Telephone Encounter (Signed)
I spoke with pt and he scheduled Doxy.me today at 4:15. FYI to Dr Damita Dunnings.

## 2019-04-12 NOTE — Telephone Encounter (Signed)
Will see at [facility].  Thanks.  

## 2019-04-12 NOTE — Progress Notes (Signed)
Virtual visit completed through WebEx or similar program Patient location: home  Provider location: [place] at [facility], office   Limitations and rationale for visit method d/w patient.  Patient agreed to proceed.   CC: possible toenail fungus.    HPI:  Had a pedicure on 02/20/2019.  Then noted B 1st nail changes. He has filed the nails.  No pain, redness. No sign of infection.    Part of R 1st nail medially came off previously.  No pain even with walking.  He has some changes on the proximal/medial aspect of the left first nail.  No loss of nail on the left first toe.  Routine cautions d/w pt re: pandemic.   Meds and allergies reviewed.   ROS: Per HPI unless specifically indicated in ROS section   NAD Speech wnl L 1st toenail with proximal medial thickening R 1st nail with medial with section removed.  It doesn't look ingrown.  Seen by video  A/P: Toenail changes. L 1st toenail with proximal medial thickening R 1st nail with medial with section removed.  It doesn't look ingrown.    D/w pt about routine care.  No abx or other intervention needed now.  Would defer on tx for nail fungus given mild changes on the L 1st nail.  The right first nail should be able to grow out.  Update me as needed.

## 2019-04-12 NOTE — Telephone Encounter (Signed)
Copied from [place] [phone]. Topic: General - Other >> Apr 11, 2019  4:21 PM Nils Flack, [name] Kitchen wrote: Reason for CRM: pt called - he thinks he may have nail fungus on his toe nail and would like to know if there is something he can do over the counter for it or if he needs to come in.  Please call [phone]

## 2019-04-13 DIAGNOSIS — L608 Other nail disorders: Secondary | ICD-10-CM | POA: Insufficient documentation

## 2019-04-13 NOTE — Assessment & Plan Note (Signed)
L 1st toenail with proximal medial thickening R 1st nail with medial with section removed.  It doesn't look ingrown.    D/w pt about routine care.  No abx or other intervention needed now.  Would defer on tx for nail fungus given mild changes on the L 1st nail.  The right first nail should be able to grow out.  Update me as needed.

## 2019-05-12 ENCOUNTER — Encounter: Payer: Self-pay | Admitting: Gastroenterology

## 2019-05-31 ENCOUNTER — Encounter: Payer: Self-pay | Admitting: Family Medicine

## 2019-05-31 ENCOUNTER — Ambulatory Visit: Payer: BC Managed Care – PPO | Admitting: Family Medicine

## 2019-05-31 ENCOUNTER — Other Ambulatory Visit: Payer: Self-pay

## 2019-05-31 VITALS — BP 138/84 | HR 82 | Temp 98.3°F | Ht 69.0 in | Wt 312.5 lb

## 2019-05-31 DIAGNOSIS — E1169 Type 2 diabetes mellitus with other specified complication: Secondary | ICD-10-CM

## 2019-05-31 DIAGNOSIS — R1310 Dysphagia, unspecified: Secondary | ICD-10-CM | POA: Diagnosis not present

## 2019-05-31 DIAGNOSIS — E119 Type 2 diabetes mellitus without complications: Secondary | ICD-10-CM

## 2019-05-31 DIAGNOSIS — E669 Obesity, unspecified: Secondary | ICD-10-CM | POA: Diagnosis not present

## 2019-05-31 LAB — POCT GLYCOSYLATED HEMOGLOBIN (HGB A1C): Hemoglobin A1C: 8 % — AB (ref 4.0–5.6)

## 2019-05-31 NOTE — Progress Notes (Signed)
Diabetes:  Using medications without difficulties: yes Hypoglycemic episodes:no Hyperglycemic episodes:no Feet problems: see exam re: B 1st nails Blood Sugars averaging: ~150 on recent check but that was after higher carbs the night prior.   eye exam within last year: d/w pt .  POC A1c done at [facility].  8.0.   Taking 5mg  glipizide and 500mg  metformin.   He'll call GI about f/u when possible.  He is eating slower and that seems to help.  He only has heartburn with clear trigger foods.  He avoids triggers and eating right before bed.    He thought he had a bug bite recently, was driving and felt a sting near the umbilicus. Resolving in the meantime.  No fevers.    Meds, vitals, and allergies reviewed.  ROS: Per HPI unless specifically indicated in ROS section   GEN: nad, alert and oriented HEENT: NCAT NECK: supple w/o LA CV: rrr. PULM: ctab, no inc wob ABD: soft, +bs EXT: no edema SKIN: resolving likely bite site near the umbilicus, not ttp.  No spreading erythema.   Diabetic foot exam: Normal inspection No skin breakdown No calluses  Normal DP pulses Normal sensation to light touch and monofilament Nails normal except for narrowing of the B 1st nails with crack on L 1st nail growing out.

## 2019-05-31 NOTE — Patient Instructions (Addendum)
Recheck in 3 months, A1c at the visit.  Try taking an extra half tab of glipizide in the meantime.  Keep working on diet and exercise.  Take care.  Glad to see you.

## 2019-06-01 ENCOUNTER — Other Ambulatory Visit: Payer: Self-pay | Admitting: Family Medicine

## 2019-06-01 NOTE — Assessment & Plan Note (Addendum)
POC A1c done at [facility].  8.0.   Taking 5mg  glipizide and 500mg  metformin.  Recheck in 3 months, A1c at the visit.  He'll try taking an extra half tab of glipizide in the meantime.  He'll keep working on diet and exercise.

## 2019-06-01 NOTE — Assessment & Plan Note (Signed)
He'll call GI about f/u when possible.  He is eating slower and that seems to help.  He only has heartburn with clear trigger foods.  He avoids triggers and eating right before bed.

## 2019-06-11 ENCOUNTER — Other Ambulatory Visit: Payer: Self-pay | Admitting: Family Medicine

## 2019-06-13 NOTE — Telephone Encounter (Signed)
Electronic refill request. Sildenafil Last office visit:   05/31/2019 Last Filled:    50 tablet 12 04/09/2018  Please advise.

## 2019-06-15 NOTE — Telephone Encounter (Signed)
Sent. Thanks.   

## 2019-07-20 ENCOUNTER — Other Ambulatory Visit: Payer: Self-pay | Admitting: Family Medicine

## 2019-07-20 NOTE — Progress Notes (Signed)
There was reportedly an error with epic and the refill order did not go through or was auto cancelled.  I have the order pended below.  Please verify with patient.  If needed, please send.  I apologize for any inconvenience.  Thanks.

## 2019-07-21 MED ORDER — ONETOUCH VERIO VI STRP: ORAL_STRIP | 3 refills | Status: DC

## 2019-07-21 NOTE — Progress Notes (Signed)
Refill sent in

## 2019-07-22 LAB — HM DIABETES EYE EXAM

## 2019-09-01 ENCOUNTER — Ambulatory Visit: Payer: BC Managed Care – PPO | Admitting: Family Medicine

## 2019-09-01 ENCOUNTER — Other Ambulatory Visit: Payer: Self-pay

## 2019-09-01 ENCOUNTER — Encounter: Payer: Self-pay | Admitting: Family Medicine

## 2019-09-01 VITALS — BP 120/80 | HR 68 | Temp 98.1°F | Ht 69.0 in | Wt 312.4 lb

## 2019-09-01 DIAGNOSIS — Z23 Encounter for immunization: Secondary | ICD-10-CM | POA: Diagnosis not present

## 2019-09-01 DIAGNOSIS — E1169 Type 2 diabetes mellitus with other specified complication: Secondary | ICD-10-CM

## 2019-09-01 DIAGNOSIS — E119 Type 2 diabetes mellitus without complications: Secondary | ICD-10-CM | POA: Diagnosis not present

## 2019-09-01 DIAGNOSIS — E669 Obesity, unspecified: Secondary | ICD-10-CM | POA: Diagnosis not present

## 2019-09-01 LAB — POCT GLYCOSYLATED HEMOGLOBIN (HGB A1C): Hemoglobin A1C: 7.8 % — AB (ref 4.0–5.6)

## 2019-09-01 NOTE — Assessment & Plan Note (Signed)
A1c d/w pt.  Slightly better from prev.  Requesting copy of eye exam report.  He can try going up to 2 glipizide pills in the AM.  If any low sugars, then cut back to 1-1.5 tabs.  Recheck in about 3-4 months at a physical with labs ahead of time.  He agrees.

## 2019-09-01 NOTE — Progress Notes (Signed)
Diabetes:  Using medications without difficulties: taking 500mg  metformin and 5mg  glipizide.   Hypoglycemic episodes: no Hyperglycemic episodes:no Feet problems:no Blood Sugars averaging: 130s-170s.   eye exam within last year: d/w pt, done this summer- [facility].   A1c d/w pt.  Slightly better from prev.   He is going to f/u with GI when possible.  D/w pt.    Meds, vitals, and allergies reviewed.   ROS: Per HPI unless specifically indicated in ROS section   GEN: nad, alert and oriented HEENT: ncat NECK: supple w/o LA CV: rrr. PULM: ctab, no inc wob ABD: soft, +bs EXT: no edema SKIN: no acute rash  Diabetic foot exam: Normal inspection No skin breakdown No calluses except for minimal callous on distal L 5th MT Normal DP pulses Normal sensation to light touch and monofilament Nails normal

## 2019-09-01 NOTE — Patient Instructions (Addendum)
You can try going up to 2 glipizide pills in the AM.  If you have any low sugars, then cut back.  Recheck in about 3-4 months at a physical with labs ahead of time.  Take care.  Glad to see you.

## 2019-09-13 ENCOUNTER — Other Ambulatory Visit: Payer: Self-pay | Admitting: Family Medicine

## 2019-09-13 DIAGNOSIS — E1169 Type 2 diabetes mellitus with other specified complication: Secondary | ICD-10-CM

## 2020-01-01 ENCOUNTER — Other Ambulatory Visit: Payer: Self-pay | Admitting: Family Medicine

## 2020-01-01 DIAGNOSIS — E119 Type 2 diabetes mellitus without complications: Secondary | ICD-10-CM

## 2020-01-05 ENCOUNTER — Other Ambulatory Visit: Payer: Self-pay

## 2020-01-05 ENCOUNTER — Other Ambulatory Visit (INDEPENDENT_AMBULATORY_CARE_PROVIDER_SITE_OTHER): Payer: BC Managed Care – PPO

## 2020-01-05 DIAGNOSIS — E119 Type 2 diabetes mellitus without complications: Secondary | ICD-10-CM | POA: Diagnosis not present

## 2020-01-05 LAB — COMPREHENSIVE METABOLIC PANEL
ALT: 14 U/L (ref 0–53)
AST: 15 U/L (ref 0–37)
Albumin: 3.9 g/dL (ref 3.5–5.2)
Alkaline Phosphatase: 52 U/L (ref 39–117)
BUN: 16 mg/dL (ref 6–23)
CO2: 25 mEq/L (ref 19–32)
Calcium: 9.1 mg/dL (ref 8.4–10.5)
Chloride: 102 mEq/L (ref 96–112)
Creatinine, Ser: 0.87 mg/dL (ref 0.40–1.50)
GFR: 88.83 mL/min (ref >60.00)
Glucose, Bld: 202 mg/dL — ABNORMAL HIGH (ref 70–99)
Potassium: 4.2 mEq/L (ref 3.5–5.1)
Sodium: 136 mEq/L (ref 135–145)
Total Bilirubin: 0.7 mg/dL (ref 0.2–1.2)
Total Protein: 6.8 g/dL (ref 6.0–8.3)

## 2020-01-05 LAB — LIPID PANEL
Cholesterol: 208 mg/dL — ABNORMAL HIGH (ref 0–200)
HDL: 55.5 mg/dL (ref >39.00)
LDL Cholesterol: 135 mg/dL — ABNORMAL HIGH (ref 0–99)
NonHDL: 152.94
Total CHOL/HDL Ratio: 4
Triglycerides: 88 mg/dL (ref 0.0–149.0)
VLDL: 17.6 mg/dL (ref 0.0–40.0)

## 2020-01-05 LAB — HEMOGLOBIN A1C: Hgb A1c MFr Bld: 8.7 % — ABNORMAL HIGH (ref 4.6–6.5)

## 2020-01-12 ENCOUNTER — Other Ambulatory Visit: Payer: Self-pay

## 2020-01-12 ENCOUNTER — Encounter: Payer: Self-pay | Admitting: Family Medicine

## 2020-01-12 ENCOUNTER — Ambulatory Visit (INDEPENDENT_AMBULATORY_CARE_PROVIDER_SITE_OTHER): Payer: BC Managed Care – PPO | Admitting: Family Medicine

## 2020-01-12 VITALS — BP 130/86 | HR 68 | Temp 96.8°F | Ht 69.0 in | Wt 316.4 lb

## 2020-01-12 DIAGNOSIS — Z125 Encounter for screening for malignant neoplasm of prostate: Secondary | ICD-10-CM

## 2020-01-12 DIAGNOSIS — G4733 Obstructive sleep apnea (adult) (pediatric): Secondary | ICD-10-CM

## 2020-01-12 DIAGNOSIS — E1169 Type 2 diabetes mellitus with other specified complication: Secondary | ICD-10-CM

## 2020-01-12 DIAGNOSIS — Z7189 Other specified counseling: Secondary | ICD-10-CM

## 2020-01-12 DIAGNOSIS — Z0000 Encounter for general adult medical examination without abnormal findings: Secondary | ICD-10-CM | POA: Diagnosis not present

## 2020-01-12 DIAGNOSIS — E119 Type 2 diabetes mellitus without complications: Secondary | ICD-10-CM

## 2020-01-12 NOTE — Progress Notes (Signed)
This visit occurred during the SARS-CoV-2 public health emergency.  Safety protocols were in place, including screening questions prior to the visit, additional usage of staff PPE, and extensive cleaning of exam room while observing appropriate contact time as indicated for disinfecting solutions.  CPE- See plan.  Routine anticipatory guidance given to patient.  See health maintenance.  The possibility exists that previously documented standard health maintenance information may have been brought forward from a previous encounter into this note.  If needed, that same information has been updated to reflect the current situation based on today's encounter.    Tetanus 2018 Flu 2020 PNA 2014 Shingles d/w pt.  See avs.    covid vaccine d/w pt.  Colonoscopy 2015, deferred now given pandemic, he'll call when possible.   Prostate cancer screening d/w pt.  Will check PSA with next set of labs.  Pros and cons d/w pt.   Living will d/w pt. Wife designated if patient were incapacitated.  Some nocturia, stream is slightly slower.  Not bothersome enough to tx at this point.   OSA on CPAP.  Compliant.  Doing well with use.  Diabetes:  Using medications without difficulties: yes Hypoglycemic episodes:no Hyperglycemic episodes:no Feet problems:no Blood Sugars averaging: checked episodically, usually 150-170.   eye exam within last year: done last summer, [facility] [employer] [facility] [place].   Statin intolerant.   A1c up, d/w pt.  Prev in the 7s.  He had been off diet.    PMH and SH reviewed Meds, vitals, and allergies reviewed.   ROS: Per HPI.  Unless specifically indicated otherwise in HPI, the patient denies:  General: fever. Eyes: acute vision changes ENT: sore throat Cardiovascular: chest pain Respiratory: SOB GI: vomiting GU: dysuria Musculoskeletal: acute back pain Derm: acute rash Neuro: acute motor dysfunction Psych: worsening mood Endocrine: polydipsia Heme:  bleeding Allergy: hayfever  GEN: nad, alert and oriented HEENT: ncat NECK: supple w/o LA CV: rrr. PULM: ctab, no inc wob ABD: soft, +bs EXT: no edema SKIN: no acute rash  Diabetic foot exam: Normal inspection No skin breakdown B 1st toe calluses w/o ulceration  Normal DP pulses Normal sensation to light touch and monofilament Nails normal

## 2020-01-12 NOTE — Patient Instructions (Addendum)
Check with your insurance to see if they will cover the shingles shot. Please call about seeing Dr. Halford Chessman with pulmonary.   Please price check jardiance and [place].  Let me know about those.  Plan on recheck in about 3 months.  Keep working on diet and exercise.  Take care.  Glad to see you.

## 2020-01-15 DIAGNOSIS — G4730 Sleep apnea, unspecified: Secondary | ICD-10-CM | POA: Insufficient documentation

## 2020-01-15 NOTE — Assessment & Plan Note (Addendum)
OSA on CPAP.  Compliant.  Doing well with use.  Continue as is.  Discussed diet and exercise.  He will check on pulmonary follow-up.

## 2020-01-15 NOTE — Assessment & Plan Note (Signed)
Tetanus 2018 Flu 2020 PNA 2014 Shingles d/w pt.  See avs.    covid vaccine d/w pt.  Colonoscopy 2015, deferred now given pandemic, he'll call when possible.   Prostate cancer screening d/w pt.  Will check PSA with next set of labs.  Pros and cons d/w pt.   Living will d/w pt. Wife designated if patient were incapacitated.

## 2020-01-15 NOTE — Assessment & Plan Note (Signed)
Living will d/w pt.  Wife designated if patient were incapacitated.   ?

## 2020-01-15 NOTE — Assessment & Plan Note (Signed)
A1c up, d/w pt.  Prev in the 7s.  He had been off diet.   Labs discussed with patient.  Statin intolerant. I want him to price check jardiance and [place] and let me know about those.  He agrees. Plan on recheck in about 3 months.  Keep working on diet and exercise.

## 2020-01-18 ENCOUNTER — Telehealth: Payer: Self-pay

## 2020-01-18 NOTE — Telephone Encounter (Signed)
Pt said he checked with his pharmacy about Jardiance and Januvia. Pharmacy said without a rx sent in for either medication, they cannot tell him a price. He has not contacted the insurance yet. I advised him to call his pharmacy benefits and ask which tier these medications fall under and the pharmacy may be able to give him a round about charge that way.  He will check with them and call us back.

## 2020-01-18 NOTE — Telephone Encounter (Signed)
Pt left a message on Triage VM with his name, DOB, and phone number and that was all. I called him back and it went straight to VM. Left him a message to call the office so we can help him.

## 2020-02-24 NOTE — Telephone Encounter (Signed)
Patient called back and states that he spoke with pharmacist at [facility] and was told that either Jardiance or Januvia should be covered. Patient states the pharmacy needs RX sent in, in order to check the price and coverage. Patient states Dr Damita Dunnings told patient he wanted him to try Januvia so if we can go ahead and send that in. (patient did not call his insurance)  Also patient wants to know if he needs to finish  Metformin all the way before switching over to the new medication?

## 2020-02-26 MED ORDER — SITAGLIPTIN PHOSPHATE 100 MG PO TABS: 50.0000 mg | ORAL_TABLET | Freq: Every day | ORAL | 3 refills | Status: DC

## 2020-02-26 NOTE — Addendum Note (Signed)
Addended by: Tonia Ghent on: 02/26/2020 07:26 PM   Modules accepted: Orders

## 2020-02-26 NOTE — Telephone Encounter (Signed)
If he can tolerate current dose of metformin, then continue as is.   Add on [place], rx sent.  Start with 1/2 tab a day.  If sugar still >130 in the AM, then increase to 1 tab a day.   Thanks.

## 2020-02-27 NOTE — Telephone Encounter (Signed)
Patient advised.

## 2020-03-26 ENCOUNTER — Encounter: Payer: Self-pay | Admitting: Family Medicine

## 2020-03-26 NOTE — Progress Notes (Signed)
[facility]/thx dmf

## 2020-04-13 ENCOUNTER — Other Ambulatory Visit (INDEPENDENT_AMBULATORY_CARE_PROVIDER_SITE_OTHER): Payer: BC Managed Care – PPO

## 2020-04-13 DIAGNOSIS — Z125 Encounter for screening for malignant neoplasm of prostate: Secondary | ICD-10-CM | POA: Diagnosis not present

## 2020-04-13 DIAGNOSIS — E119 Type 2 diabetes mellitus without complications: Secondary | ICD-10-CM | POA: Diagnosis not present

## 2020-04-13 LAB — HEMOGLOBIN A1C: Hgb A1c MFr Bld: 8.8 % — ABNORMAL HIGH (ref 4.6–6.5)

## 2020-04-13 LAB — PSA: PSA: 2.6 ng/mL (ref 0.10–4.00)

## 2020-04-17 ENCOUNTER — Ambulatory Visit: Payer: BC Managed Care – PPO | Admitting: Family Medicine

## 2020-04-24 ENCOUNTER — Encounter: Payer: Self-pay | Admitting: Family Medicine

## 2020-04-24 ENCOUNTER — Encounter: Payer: Self-pay | Admitting: Gastroenterology

## 2020-04-24 ENCOUNTER — Other Ambulatory Visit: Payer: Self-pay

## 2020-04-24 ENCOUNTER — Ambulatory Visit: Payer: BC Managed Care – PPO | Admitting: Family Medicine

## 2020-04-24 VITALS — BP 130/84 | HR 95 | Temp 97.0°F | Ht 69.0 in | Wt 310.6 lb

## 2020-04-24 DIAGNOSIS — E669 Obesity, unspecified: Secondary | ICD-10-CM

## 2020-04-24 DIAGNOSIS — Z1211 Encounter for screening for malignant neoplasm of colon: Secondary | ICD-10-CM

## 2020-04-24 DIAGNOSIS — E1169 Type 2 diabetes mellitus with other specified complication: Secondary | ICD-10-CM | POA: Diagnosis not present

## 2020-04-24 MED ORDER — ONETOUCH VERIO VI STRP: ORAL_STRIP | 3 refills | Status: DC

## 2020-04-24 NOTE — Patient Instructions (Addendum)
I put in the referral to see the GI clinic.  Add on januvia.  Start with 1/2 tab a day.  If sugar still >130 in the AM after two weeks, then increase to 1 tab a day.  Recheck in about 3 months.  A1c at the visit.  Take care.  Glad to see you.

## 2020-04-24 NOTE — Progress Notes (Signed)
his visit occurred during the SARS-CoV-2 public health emergency.  Safety protocols were in place, including screening questions prior to the visit, additional usage of staff PPE, and extensive cleaning of exam room while observing appropriate contact time as indicated for disinfecting solutions.  Diabetes:  Using medications without difficulties: yes Hypoglycemic episodes: no Hyperglycemic episodes: no Feet problems: no Blood Sugars averaging: ~150 usually.   eye exam within last year: yes A1c 8.8. d/w pt at [facility].  He is walking for exercise.    PSA 2.6 similar to prev (2.23 when checked 6 years prior)  He had covid vaccine done, [employer].  D/w pt.    Referral placed for GI.  Discussed.  Meds, vitals, and allergies reviewed.   ROS: Per HPI unless specifically indicated in ROS section   GEN: nad, alert and oriented HEENT: ncat NECK: supple w/o LA CV: rrr. PULM: ctab, no inc wob ABD: soft, +bs EXT: no edema SKIN: no acute rash

## 2020-04-26 NOTE — Assessment & Plan Note (Signed)
He has not started Januvia yet. A1c 8.8. d/w pt at [facility].  He is walking for exercise.   Add on Januvia half tablet a day.  Increase to 1 tablet thereafter if needed.  See after visit summary.  He agrees.  Continue work on diet and exercise.

## 2020-06-12 ENCOUNTER — Ambulatory Visit (AMBULATORY_SURGERY_CENTER): Payer: Self-pay

## 2020-06-12 ENCOUNTER — Other Ambulatory Visit: Payer: Self-pay

## 2020-06-12 VITALS — Ht 69.0 in | Wt 314.2 lb

## 2020-06-12 DIAGNOSIS — Z86010 Personal history of colonic polyps: Secondary | ICD-10-CM

## 2020-06-12 DIAGNOSIS — Z1211 Encounter for screening for malignant neoplasm of colon: Secondary | ICD-10-CM

## 2020-06-12 MED ORDER — SUTAB 1479-225-188 MG PO TABS: 12.0000 | ORAL_TABLET | ORAL | 0 refills | Status: DC

## 2020-06-12 NOTE — Progress Notes (Signed)
No allergies to soy or egg Pt is not on blood thinners or diet pills Denies issues with sedation/intubation Denies atrial flutter/fib Denies constipation   Pt is aware of Covid safety and care partner requirements.      

## 2020-06-22 ENCOUNTER — Encounter: Payer: Self-pay | Admitting: Gastroenterology

## 2020-07-01 ENCOUNTER — Encounter: Payer: Self-pay | Admitting: Certified Registered Nurse Anesthetist

## 2020-07-02 ENCOUNTER — Encounter: Payer: Self-pay | Admitting: Gastroenterology

## 2020-07-02 ENCOUNTER — Ambulatory Visit (AMBULATORY_SURGERY_CENTER): Payer: BC Managed Care – PPO | Admitting: Gastroenterology

## 2020-07-02 ENCOUNTER — Other Ambulatory Visit: Payer: Self-pay

## 2020-07-02 VITALS — BP 110/73 | HR 72 | Temp 96.9°F | Resp 11 | Ht 69.0 in | Wt 314.0 lb

## 2020-07-02 DIAGNOSIS — D123 Benign neoplasm of transverse colon: Secondary | ICD-10-CM

## 2020-07-02 DIAGNOSIS — Z86010 Personal history of colonic polyps: Secondary | ICD-10-CM

## 2020-07-02 DIAGNOSIS — D124 Benign neoplasm of descending colon: Secondary | ICD-10-CM | POA: Diagnosis not present

## 2020-07-02 MED ORDER — SODIUM CHLORIDE 0.9 % IV SOLN
500.0000 mL | Freq: Once | INTRAVENOUS | Status: DC
Start: 2020-07-02 — End: 2020-07-02

## 2020-07-02 NOTE — Progress Notes (Signed)
Report given to PACU, vss 

## 2020-07-02 NOTE — Op Note (Signed)
[place] Patient Name: Brett Stanley Procedure Date: 07/02/2020 8:42 AM MRN: [id number] Endoscopist: Ladene Artist , MD Age: 64 Referring MD:  Date of Birth: 19-Aug-1956 Gender: Male Account #: [id number] Procedure:                Colonoscopy Indications:              Surveillance: Personal history of adenomatous                            polyps on last colonoscopy > 5 years ago Medicines:                Monitored Anesthesia Care Procedure:                Pre-Anesthesia Assessment:                           - Prior to the procedure, a History and Physical                            was performed, and patient medications and                            allergies were reviewed. The patient's tolerance of                            previous anesthesia was also reviewed. The risks                            and benefits of the procedure and the sedation                            options and risks were discussed with the patient.                            All questions were answered, and informed consent                            was obtained. Prior Anticoagulants: The patient has                            taken no previous anticoagulant or antiplatelet                            agents. ASA Grade Assessment: III - A patient with                            severe systemic disease. After reviewing the risks                            and benefits, the patient was deemed in                            satisfactory condition to undergo the procedure.  After obtaining informed consent, the colonoscope                            was passed under direct vision. Throughout the                            procedure, the patient's blood pressure, pulse, and                            oxygen saturations were monitored continuously. The                            Colonoscope was introduced through the anus and                            advanced to the the  cecum, identified by                            appendiceal orifice and ileocecal valve. The                            ileocecal valve, appendiceal orifice, and rectum                            were photographed. The quality of the bowel                            preparation was good. The colonoscopy was performed                            without difficulty. The patient tolerated the                            procedure well. Scope In: 8:48:38 AM Scope Out: 9:04:31 AM Scope Withdrawal Time: 0 hours 13 minutes 56 seconds  Total Procedure Duration: 0 hours 15 minutes 53 seconds  Findings:                 The perianal and digital rectal examinations were                            normal.                           Two sessile polyps were found in the descending                            colon and transverse colon. The polyps were 8 to 10                            mm in size. These polyps were removed with a cold                            snare. Resection and retrieval were complete.  A few small-mouthed diverticula were found in the                            left colon. There was no evidence of diverticular                            bleeding.                           Internal hemorrhoids were found during                            retroflexion. The hemorrhoids were small and Grade                            I (internal hemorrhoids that do not prolapse).                           The exam was otherwise without abnormality on                            direct and retroflexion views. Complications:            No immediate complications. Estimated blood loss:                            None. Estimated Blood Loss:     Estimated blood loss: none. Impression:               - Two 8 to 10 mm polyps in the descending colon and                            in the transverse colon, removed with a cold snare.                            Resected and retrieved.                            - Mild diverticulosis in the left colon.                           - Internal hemorrhoids.                           - The examination was otherwise normal on direct                            and retroflexion views. Recommendation:           - Repeat colonoscopy date to be determined, likely                            3 years, after pending pathology results are                            reviewed for surveillance based on pathology  results.                           - Patient has a contact number available for                            emergencies. The signs and symptoms of potential                            delayed complications were discussed with the                            patient. Return to normal activities tomorrow.                            Written discharge instructions were provided to the                            patient.                           - Resume previous diet.                           - Continue present medications.                           - Await pathology results. Ladene Artist, MD 07/02/2020 9:08:01 AM This report has been signed electronically.

## 2020-07-02 NOTE — Progress Notes (Signed)
Pt's states no medical or surgical changes since previsit or office visit. 

## 2020-07-02 NOTE — Progress Notes (Signed)
Called to room to assist during endoscopic procedure.  Patient ID and intended procedure confirmed with present staff. Received instructions for my participation in the procedure from the performing physician.  

## 2020-07-02 NOTE — Patient Instructions (Signed)
HANDOUTS PROVIDED ON: POLYPS, DIVERTICULOSIS, & HEMORRHOIDS  The polyps removed today have been sent for pathology.  The results can take 1-3 weeks to receive.  When your next colonoscopy should occur will be based on the pathology results.    You may resume your previous diet and medication schedule.  Thank you for allowing us to care for you today!!!   YOU HAD AN ENDOSCOPIC PROCEDURE TODAY AT THE [facility]:   Refer to the procedure report that was given to you for any specific questions about what was found during the examination.  If the procedure report does not answer your questions, please call your gastroenterologist to clarify.  If you requested that your care partner not be given the details of your procedure findings, then the procedure report has been included in a sealed envelope for you to review at your convenience later.  YOU SHOULD EXPECT: Some feelings of bloating in the abdomen. Passage of more gas than usual.  Walking can help get rid of the air that was put into your GI tract during the procedure and reduce the bloating. If you had a lower endoscopy (such as a colonoscopy or flexible sigmoidoscopy) you may notice spotting of blood in your stool or on the toilet paper. If you underwent a bowel prep for your procedure, you may not have a normal bowel movement for a few days.  Please Note:  You might notice some irritation and congestion in your nose or some drainage.  This is from the oxygen used during your procedure.  There is no need for concern and it should clear up in a day or so.  SYMPTOMS TO REPORT IMMEDIATELY:   Following lower endoscopy (colonoscopy or flexible sigmoidoscopy):  Excessive amounts of blood in the stool  Significant tenderness or worsening of abdominal pains  Swelling of the abdomen that is new, acute  Fever of 100F or higher  For urgent or emergent issues, a gastroenterologist can be reached at any hour by calling [phone]. Do  not use MyChart messaging for urgent concerns.    DIET:  We do recommend a small meal at first, but then you may proceed to your regular diet.  Drink plenty of fluids but you should avoid alcoholic beverages for 24 hours.  ACTIVITY:  You should plan to take it easy for the rest of today and you should NOT DRIVE or use heavy machinery until tomorrow (because of the sedation medicines used during the test).    FOLLOW UP: Our staff will call the number listed on your records 48-72 hours following your procedure to check on you and address any questions or concerns that you may have regarding the information given to you following your procedure. If we do not reach you, we will leave a message.  We will attempt to reach you two times.  During this call, we will ask if you have developed any symptoms of COVID 19. If you develop any symptoms (ie: fever, flu-like symptoms, shortness of breath, cough etc.) before then, please call [phone].  If you test positive for Covid 19 in the 2 weeks post procedure, please call and report this information to us.    If any biopsies were taken you will be contacted by phone or by letter within the next 1-3 weeks.  Please call us at [phone] if you have not heard about the biopsies in 3 weeks.    SIGNATURES/CONFIDENTIALITY: You and/or your care partner have signed paperwork which will   be entered into your electronic medical record.  These signatures attest to the fact that that the information above on your After Visit Summary has been reviewed and is understood.  Full responsibility of the confidentiality of this discharge information lies with you and/or your care-partner. 

## 2020-07-04 ENCOUNTER — Telehealth: Payer: Self-pay | Admitting: *Deleted

## 2020-07-04 NOTE — Telephone Encounter (Signed)
  Follow up Call-  Call back number 07/02/2020  Post procedure Call Back phone  # [phone]  Permission to leave phone message Yes  Some recent data might be hidden     Patient questions:  Do you have a fever, pain , or abdominal swelling? No. Pain Score  0 *  Have you tolerated food without any problems? Yes.    Have you been able to return to your normal activities? Yes.    Do you have any questions about your discharge instructions: Diet   No. Medications  No. Follow up visit  No.  Do you have questions or concerns about your Care? No.  Actions: * If pain score is 4 or above: No action needed, pain <4.  1. Have you developed a fever since your procedure? no  2.   Have you had an respiratory symptoms (SOB or cough) since your procedure? no  3.   Have you tested positive for COVID 19 since your procedure no  4.   Have you had any family members/close contacts diagnosed with the COVID 19 since your procedure?  no   If yes to any of these questions please route to Joylene John, RN and Erenest Rasher, RN

## 2020-07-09 ENCOUNTER — Encounter: Payer: Self-pay | Admitting: Gastroenterology

## 2020-07-24 ENCOUNTER — Ambulatory Visit: Payer: BC Managed Care – PPO | Admitting: Family Medicine

## 2020-07-24 ENCOUNTER — Other Ambulatory Visit: Payer: Self-pay

## 2020-07-24 ENCOUNTER — Encounter: Payer: Self-pay | Admitting: Family Medicine

## 2020-07-24 VITALS — BP 122/84 | HR 84 | Temp 96.8°F | Ht 69.0 in | Wt 307.0 lb

## 2020-07-24 DIAGNOSIS — E1169 Type 2 diabetes mellitus with other specified complication: Secondary | ICD-10-CM

## 2020-07-24 DIAGNOSIS — E119 Type 2 diabetes mellitus without complications: Secondary | ICD-10-CM | POA: Diagnosis not present

## 2020-07-24 DIAGNOSIS — R1310 Dysphagia, unspecified: Secondary | ICD-10-CM

## 2020-07-24 DIAGNOSIS — G720 Drug-induced myopathy: Secondary | ICD-10-CM | POA: Diagnosis not present

## 2020-07-24 DIAGNOSIS — T466X5A Adverse effect of antihyperlipidemic and antiarteriosclerotic drugs, initial encounter: Secondary | ICD-10-CM

## 2020-07-24 DIAGNOSIS — E669 Obesity, unspecified: Secondary | ICD-10-CM

## 2020-07-24 LAB — POCT GLYCOSYLATED HEMOGLOBIN (HGB A1C): Hemoglobin A1C: 8.3 % — AB (ref 4.0–5.6)

## 2020-07-24 MED ORDER — SITAGLIPTIN PHOSPHATE 100 MG PO TABS: 100.0000 mg | ORAL_TABLET | Freq: Every day | ORAL | Status: DC

## 2020-07-24 NOTE — Patient Instructions (Signed)
Increase the januvia to 100mg  a day.  Keep working on diet and exercise.  Recheck in 3 months with A1c at the visit.   The only lab you need to have done for your next diabetic visit is an A1c.  We can do this with a fingerstick test at the office visit.  You do not need a lab visit ahead of time for this.  It does not matter if you are fasting when the lab is done.    Take care.  Glad to see you.

## 2020-07-24 NOTE — Progress Notes (Signed)
This visit occurred during the SARS-CoV-2 public health emergency.  Safety protocols were in place, including screening questions prior to the visit, additional usage of staff PPE, and extensive cleaning of exam room while observing appropriate contact time as indicated for disinfecting solutions.  Diabetes:  Using medications without difficulties: yes Hypoglycemic episodes:no Hyperglycemic episodes:no Feet problems: rare episodic tingling but not now.   Blood Sugars averaging:  Usually ~150 eye exam within last year: pending.   10mg  glipizide, 500mg  metformin and 50mg  [place].  He is working on diet.    Statin intolerant with joint aches.    He had colonoscopy in the meantime, d/w pt.  He is going to ask GI about EGD eval.  His dysphagia is better when he isn't eating in a hurry.    Meds, vitals, and allergies reviewed.  ROS: Per HPI unless specifically indicated in ROS section   GEN: nad, alert and oriented HEENT: ncat NECK: supple w/o LA CV: rrr. PULM: ctab, no inc wob ABD: soft, +bs EXT: no edema SKIN: no acute rash  Diabetic foot exam: Normal inspection No skin breakdown No calluses  Normal DP pulses Normal sensation to light touch and monofilament Nails normal

## 2020-07-25 DIAGNOSIS — T466X5A Adverse effect of antihyperlipidemic and antiarteriosclerotic drugs, initial encounter: Secondary | ICD-10-CM | POA: Insufficient documentation

## 2020-07-25 NOTE — Assessment & Plan Note (Signed)
He had colonoscopy in the meantime, d/w pt.  He is going to ask GI about EGD eval.  His dysphagia is better when he isn't eating in a hurry.

## 2020-07-25 NOTE — Assessment & Plan Note (Signed)
10mg  glipizide, 500mg  metformin and 50mg  [place] currently He is working on diet.   Continue current medications but increase Januvia to 100 mg a day.  Recheck in a few months.  He agrees with plan.  He will update me as needed.  I thanked him for his effort.

## 2020-07-25 NOTE — Assessment & Plan Note (Signed)
Statin intolerant with joint aches.

## 2020-09-05 ENCOUNTER — Other Ambulatory Visit: Payer: Self-pay | Admitting: Family Medicine

## 2020-09-06 ENCOUNTER — Other Ambulatory Visit: Payer: Self-pay | Admitting: Family Medicine

## 2020-09-06 MED ORDER — GLIPIZIDE 5 MG PO TABS: ORAL_TABLET | ORAL | 3 refills | Status: DC

## 2020-09-06 NOTE — Progress Notes (Signed)
rx sent

## 2020-09-14 ENCOUNTER — Other Ambulatory Visit: Payer: Self-pay | Admitting: Family Medicine

## 2020-09-14 DIAGNOSIS — E669 Obesity, unspecified: Secondary | ICD-10-CM

## 2020-10-25 ENCOUNTER — Encounter: Payer: Self-pay | Admitting: Family Medicine

## 2020-10-25 ENCOUNTER — Ambulatory Visit: Payer: BC Managed Care – PPO | Admitting: Family Medicine

## 2020-10-25 ENCOUNTER — Other Ambulatory Visit: Payer: Self-pay

## 2020-10-25 VITALS — BP 128/78 | HR 75 | Temp 97.8°F | Ht 69.0 in | Wt 305.2 lb

## 2020-10-25 DIAGNOSIS — E669 Obesity, unspecified: Secondary | ICD-10-CM

## 2020-10-25 DIAGNOSIS — E1169 Type 2 diabetes mellitus with other specified complication: Secondary | ICD-10-CM | POA: Diagnosis not present

## 2020-10-25 DIAGNOSIS — G720 Drug-induced myopathy: Secondary | ICD-10-CM

## 2020-10-25 DIAGNOSIS — T466X5A Adverse effect of antihyperlipidemic and antiarteriosclerotic drugs, initial encounter: Secondary | ICD-10-CM | POA: Diagnosis not present

## 2020-10-25 LAB — POCT GLYCOSYLATED HEMOGLOBIN (HGB A1C): Hemoglobin A1C: 8.4 % — AB (ref 4.0–5.6)

## 2020-10-25 NOTE — Progress Notes (Signed)
This visit occurred during the SARS-CoV-2 public health emergency.  Safety protocols were in place, including screening questions prior to the visit, additional usage of staff PPE, and extensive cleaning of exam room while observing appropriate contact time as indicated for disinfecting solutions.  Diabetes:  Using medications without difficulties: yes, januvia, metformin, glipizide.  Hypoglycemic episodes: no Hyperglycemic episodes: no Feet problems: no Blood Sugars averaging:  ~150.   eye exam within last year: due, d/w pt.  A1c done at OV. Similar to prev at 8.4.  He recently cut back on carbs.  He had been using honey for allergies, to see if that would help.   He wants to work more on diet and recheck in 3 months. We talked about other options, ie injection options.    Statin intolerant.  dw pt.   Meds, vitals, and allergies reviewed.  ROS: Per HPI unless specifically indicated in ROS section   GEN: nad, alert and oriented HEENT: ncat NECK: supple w/o LA CV: rrr. PULM: ctab, no inc wob ABD: soft, +bs EXT: no edema SKIN: no acute rash  Diabetic foot exam: Normal inspection No skin breakdown No calluses  Normal DP pulses Normal sensation to light touch and monofilament Nails normal

## 2020-10-25 NOTE — Patient Instructions (Addendum)
Recheck labs prior to a physical in about 3-4 months. We'll go from there.  Keep working on diet and exercise in the meantime.  Take care.  Glad to see you.

## 2020-10-28 NOTE — Assessment & Plan Note (Addendum)
A1c done at OV. Similar to prev at 8.4.  He recently cut back on carbs.  He had been using honey for allergies, to see if that would help.   He wants to work more on diet and recheck in 3 months. We talked about other options, ie injection options.    We will see where he stands in 3 months and go from there.  He agrees with plan.  Statin intolerant.  dw pt.

## 2020-10-28 NOTE — Assessment & Plan Note (Signed)
Statin intolerant.  dw pt.

## 2020-12-24 ENCOUNTER — Other Ambulatory Visit: Payer: Self-pay

## 2020-12-24 ENCOUNTER — Telehealth (INDEPENDENT_AMBULATORY_CARE_PROVIDER_SITE_OTHER): Payer: BC Managed Care – PPO | Admitting: Family

## 2020-12-24 DIAGNOSIS — J209 Acute bronchitis, unspecified: Secondary | ICD-10-CM | POA: Diagnosis not present

## 2020-12-24 MED ORDER — AZITHROMYCIN 250 MG PO TABS: ORAL_TABLET | ORAL | 0 refills | Status: DC

## 2020-12-24 MED ORDER — HYDROCODONE-HOMATROPINE 5-1.5 MG/5ML PO SYRP: 5.0000 mL | ORAL_SOLUTION | Freq: Three times a day (TID) | ORAL | 0 refills | Status: DC | PRN

## 2020-12-24 NOTE — Progress Notes (Signed)
Sherwood ADONUS USELMAN is a 65 y.o. male with the following history as recorded in Epic[employer]:  Patient Active Problem List   Diagnosis Date Noted  . Statin myopathy 07/25/2020  . Sleep apnea 01/15/2020  . Dysphagia 08/25/2018  . Hearing loss 08/25/2018  . GERD (gastroesophageal reflux disease) 08/07/2017  . Advance care planning 02/26/2015  . Snoring 10/12/2014  . Knee pain 03/07/2013  . Routine general medical examination at a health care facility 11/16/2012  . Morbid obesity ([place]) 06/22/2007  . Diabetes mellitus type 2 in obese ([place]) 06/22/2007  . HYPERCHOLESTEROLEMIA 06/15/2007  . ALLERGIC RHINITIS 06/15/2007    Current Outpatient Medications  Medication Sig Dispense Refill  . azithromycin (ZITHROMAX) 250 MG tablet 2 tabs po qd x 1 day; 1 tablet per day x 4 days; 6 tablet 0  . fexofenadine (ALLEGRA) 180 MG tablet Take 180 mg by mouth daily.    [name] Kitchen glipiZIDE (GLUCOTROL) 5 MG tablet TAKE 2 TABLETS BY MOUTH DAILY BEFORE BREAKFAST 180 tablet 3  . glucose blood (ONETOUCH VERIO) test strip Check sugar 1 time daily. E11.9 100 each 3  . HYDROcodone-homatropine (HYCODAN) 5-1.5 MG/5ML syrup Take 5 mLs by mouth every 8 (eight) hours as needed for cough. 120 mL 0  . Ibuprofen 200 MG CAPS Take by mouth as needed.     [name] Kitchen lisinopril (ZESTRIL) 5 MG tablet TAKE 1 TABLET BY MOUTH DAILY 90 tablet 1  . metFORMIN (GLUCOPHAGE) 500 MG tablet TAKE 1 TABLET BY MOUTH DAILY WITH BREAKFAST 90 tablet 1  . sildenafil (REVATIO) 20 MG tablet TAKE 5 TABLETS BY MOUTH DAILY AS NEEDED 50 tablet 12  . sitaGLIPtin (JANUVIA) 100 MG tablet Take 1 tablet (100 mg total) by mouth daily.     No current facility-administered medications for this visit.    Allergies: Atorvastatin, Erythromycin, Metformin and related, and Oxycodone  Past Medical History:  Diagnosis Date  . Allergy   . GERD (gastroesophageal reflux disease)   . HTN (hypertension)   . Hyperlipidemia   . Sleep apnea    uses CPAP irregularly  . Tubular adenoma of  colon 2010  . Type II or unspecified type diabetes mellitus without mention of complication, not stated as uncontrolled     Past Surgical History:  Procedure Laterality Date  . COLONOSCOPY     2015  . KNEE ARTHROSCOPY Right 2014  . VASECTOMY  1997   Dr. Reece Agar    Family History  Problem Relation Age of Onset  . Alzheimer's disease Mother   . Diabetes Mother   . Hypertension Other   . Cancer Other        Leukemia  . Sleep apnea Sister   . Diabetes Maternal Uncle   . Depression Maternal Uncle   . Stroke Maternal Grandfather   . Colon cancer Neg Hx   . Prostate cancer Neg Hx   . Colon polyps Neg Hx   . Esophageal cancer Neg Hx   . Rectal cancer Neg Hx   . Stomach cancer Neg Hx     Social History   Tobacco Use  . Smoking status: Never Smoker  . Smokeless tobacco: Former Network engineer Use Topics  . Alcohol use: Yes    Alcohol/week: 0.0 standard drinks    Comment: very rare use    Subjective:   I connected with Jayzen Milinda Antis on 12/24/20 at 10:00 AM EST by a telephone call and verified that I am speaking with the correct person using two identifiers.   I discussed  the limitations of evaluation and management by telemedicine and the availability of in person appointments. The patient expressed understanding and agreed to proceed. Provider in office/ patient is at home; provider and patient are only 2 people on telephone call.   Patient notes that Friday evening he noted a "tickle" in the back of his throat. Seemed to worsen over the weekend- became concerned for possible fever- notes fever was low grade- up to 100; complaining of drainage and concern for onset of bronchitis; notes "everything I get settles in my chest." cough is more problematic at night- keeping him awake;      Objective:  There were no vitals filed for this visit.  Lungs: Respirations unlabored;  Neurologic: Alert and oriented; speech intact;   Assessment:  1. Acute bronchitis, unspecified  organism     Plan:  COVID test is pending- he understands to quarantine until results are available and then discuss return to work options with his employer/ PCP as needed; In the interim, will start Z-pak and Hycodan; increase fluids, rest and follow up worse, no better.  Time spent 11 minutes  No follow-ups on file.  No orders of the defined types were placed in this encounter.   Requested Prescriptions   Signed Prescriptions Disp Refills  . azithromycin (ZITHROMAX) 250 MG tablet 6 tablet 0    Sig: 2 tabs po qd x 1 day; 1 tablet per day x 4 days;  . HYDROcodone-homatropine (HYCODAN) 5-1.5 MG/5ML syrup 120 mL 0    Sig: Take 5 mLs by mouth every 8 (eight) hours as needed for cough.

## 2021-01-14 LAB — HM DIABETES EYE EXAM

## 2021-02-10 ENCOUNTER — Other Ambulatory Visit: Payer: Self-pay | Admitting: Family Medicine

## 2021-02-10 DIAGNOSIS — E1169 Type 2 diabetes mellitus with other specified complication: Secondary | ICD-10-CM

## 2021-02-10 DIAGNOSIS — Z125 Encounter for screening for malignant neoplasm of prostate: Secondary | ICD-10-CM

## 2021-02-19 ENCOUNTER — Other Ambulatory Visit (INDEPENDENT_AMBULATORY_CARE_PROVIDER_SITE_OTHER): Payer: BC Managed Care – PPO

## 2021-02-19 ENCOUNTER — Other Ambulatory Visit: Payer: Self-pay

## 2021-02-19 DIAGNOSIS — E1169 Type 2 diabetes mellitus with other specified complication: Secondary | ICD-10-CM

## 2021-02-19 DIAGNOSIS — Z125 Encounter for screening for malignant neoplasm of prostate: Secondary | ICD-10-CM | POA: Diagnosis not present

## 2021-02-19 DIAGNOSIS — E669 Obesity, unspecified: Secondary | ICD-10-CM | POA: Diagnosis not present

## 2021-02-19 LAB — LIPID PANEL
Cholesterol: 191 mg/dL (ref 0–200)
HDL: 50.4 mg/dL (ref >39.00)
LDL Cholesterol: 126 mg/dL — ABNORMAL HIGH (ref 0–99)
NonHDL: 140.86
Total CHOL/HDL Ratio: 4
Triglycerides: 73 mg/dL (ref 0.0–149.0)
VLDL: 14.6 mg/dL (ref 0.0–40.0)

## 2021-02-19 LAB — COMPREHENSIVE METABOLIC PANEL
ALT: 14 U/L (ref 0–53)
AST: 15 U/L (ref 0–37)
Albumin: 3.7 g/dL (ref 3.5–5.2)
Alkaline Phosphatase: 45 U/L (ref 39–117)
BUN: 14 mg/dL (ref 6–23)
CO2: 25 mEq/L (ref 19–32)
Calcium: 8.7 mg/dL (ref 8.4–10.5)
Chloride: 104 mEq/L (ref 96–112)
Creatinine, Ser: 0.86 mg/dL (ref 0.40–1.50)
GFR: 92.21 mL/min (ref >60.00)
Glucose, Bld: 168 mg/dL — ABNORMAL HIGH (ref 70–99)
Potassium: 4.3 mEq/L (ref 3.5–5.1)
Sodium: 137 mEq/L (ref 135–145)
Total Bilirubin: 0.7 mg/dL (ref 0.2–1.2)
Total Protein: 6.5 g/dL (ref 6.0–8.3)

## 2021-02-19 LAB — PSA: PSA: 3.21 ng/mL (ref 0.10–4.00)

## 2021-02-19 LAB — HEMOGLOBIN A1C: Hgb A1c MFr Bld: 8.4 % — ABNORMAL HIGH (ref 4.6–6.5)

## 2021-02-26 ENCOUNTER — Ambulatory Visit (INDEPENDENT_AMBULATORY_CARE_PROVIDER_SITE_OTHER): Payer: BC Managed Care – PPO | Admitting: Family Medicine

## 2021-02-26 ENCOUNTER — Other Ambulatory Visit: Payer: Self-pay

## 2021-02-26 ENCOUNTER — Encounter: Payer: Self-pay | Admitting: Family Medicine

## 2021-02-26 VITALS — BP 122/80 | HR 79 | Temp 97.8°F | Ht 69.0 in | Wt 303.0 lb

## 2021-02-26 DIAGNOSIS — M25569 Pain in unspecified knee: Secondary | ICD-10-CM

## 2021-02-26 DIAGNOSIS — R1310 Dysphagia, unspecified: Secondary | ICD-10-CM

## 2021-02-26 DIAGNOSIS — Z0000 Encounter for general adult medical examination without abnormal findings: Secondary | ICD-10-CM | POA: Diagnosis not present

## 2021-02-26 DIAGNOSIS — Z7189 Other specified counseling: Secondary | ICD-10-CM

## 2021-02-26 DIAGNOSIS — E1169 Type 2 diabetes mellitus with other specified complication: Secondary | ICD-10-CM

## 2021-02-26 MED ORDER — SITAGLIPTIN PHOSPHATE 100 MG PO TABS: 100.0000 mg | ORAL_TABLET | Freq: Every day | ORAL | 3 refills | Status: DC

## 2021-02-26 MED ORDER — LISINOPRIL 5 MG PO TABS: 5.0000 mg | ORAL_TABLET | Freq: Every day | ORAL | 3 refills | Status: DC

## 2021-02-26 MED ORDER — ONETOUCH VERIO VI STRP: ORAL_STRIP | 3 refills | Status: DC

## 2021-02-26 MED ORDER — GLIPIZIDE 5 MG PO TABS: ORAL_TABLET | ORAL | 3 refills | Status: DC

## 2021-02-26 MED ORDER — SILDENAFIL CITRATE 20 MG PO TABS: ORAL_TABLET | ORAL | 12 refills | Status: DC

## 2021-02-26 MED ORDER — METFORMIN HCL 500 MG PO TABS: 500.0000 mg | ORAL_TABLET | Freq: Every day | ORAL | 3 refills | Status: DC

## 2021-02-26 NOTE — Progress Notes (Signed)
This visit occurred during the SARS-CoV-2 public health emergency.  Safety protocols were in place, including screening questions prior to the visit, additional usage of staff PPE, and extensive cleaning of exam room while observing appropriate contact time as indicated for disinfecting solutions.  CPE- See plan.  Routine anticipatory guidance given to patient.  See health maintenance.  The possibility exists that previously documented standard health maintenance information may have been brought forward from a previous encounter into this note.  If needed, that same information has been updated to reflect the current situation based on today's encounter.    Tetanus 2018 Flu 2021 PNA 2014 Shingles done covid vaccine prev done.   Colonoscopy 2021 Prostate cancer screening d/w pt.  PSA wnl.   Living will d/w pt. Wife designated if patient were incapacitated.  Dysphagia.  Noted less if he doesn't eat fast or eat certain foods.  No choking but "it doesn't go down for a second" if he eats a big bite.  See AVS. No blood in stool.  Discussed with patient about GI follow-up.  Diabetes:  Using medications without difficulties: yes Hypoglycemic episodes: no Hyperglycemic episodes:no Feet problems: some occ mild intermittent tingling.   Blood Sugars averaging: usually ~160 eye exam within last year: yes A1c 8.4.  Similar to prev.  He is gradually losing weight.    He had covid, along with his family, back in 12/2020.  He had mild sx, recovered.    He is using liniment on R knee, walking better with that.  More pain with walking at baseline. D/w pt about following up with ortho.    PMH and SH reviewed  Meds, vitals, and allergies reviewed.   ROS: Per HPI.  Unless specifically indicated otherwise in HPI, the patient denies:  General: fever. Eyes: acute vision changes ENT: sore throat Cardiovascular: chest pain Respiratory: SOB GI: vomiting GU: dysuria Musculoskeletal: acute back  pain Derm: acute rash Neuro: acute motor dysfunction Psych: worsening mood Endocrine: polydipsia Heme: bleeding Allergy: hayfever  GEN: nad, alert and oriented HEENT: ncat NECK: supple w/o LA CV: rrr. PULM: ctab, no inc wob ABD: soft, +bs EXT: no edema SKIN: no acute rash  Diabetic foot exam: Normal inspection No skin breakdown No calluses  Normal DP pulses Normal sensation to light touch and monofilament Nails normal

## 2021-02-26 NOTE — Patient Instructions (Addendum)
Please call GI about follow up.  [phone].  If you need a referral then let me know.  Take care.  Glad to see you. Check with ortho to see if they then an injection would help.   Plan on recheck in about 3 months with A1c at the visit.

## 2021-02-27 NOTE — Assessment & Plan Note (Signed)
I asked him to follow-up with orthopedics.  He agreed. 

## 2021-02-27 NOTE — Assessment & Plan Note (Signed)
Tetanus 2018 Flu 2021 PNA 2014 Shingles done covid vaccine prev done.   Colonoscopy 2021 Prostate cancer screening d/w pt.  PSA wnl.   Living will d/w pt. Wife designated if patient were incapacitated.

## 2021-02-27 NOTE — Assessment & Plan Note (Signed)
He is gradually losing weight.  His A1c may lag his progress.  I think it makes sense to continue glipizide Metformin and Januvia for now.  We can recheck periodically.  If his weight continues to come down then I would expect his A1c to improve.  He agrees with plan.

## 2021-02-27 NOTE — Assessment & Plan Note (Signed)
Routine cautions given to patient.  I think it makes sense for him to follow-up with GI and he agreed.  See after visit summary.

## 2021-02-27 NOTE — Assessment & Plan Note (Signed)
Living will d/w pt.  Wife designated if patient were incapacitated.   ?

## 2021-05-30 ENCOUNTER — Other Ambulatory Visit: Payer: Self-pay

## 2021-05-30 ENCOUNTER — Ambulatory Visit: Payer: BC Managed Care – PPO | Admitting: Family Medicine

## 2021-05-30 ENCOUNTER — Encounter: Payer: Self-pay | Admitting: Family Medicine

## 2021-05-30 VITALS — BP 130/80 | HR 75 | Temp 97.8°F | Ht 69.0 in | Wt 300.0 lb

## 2021-05-30 DIAGNOSIS — H6123 Impacted cerumen, bilateral: Secondary | ICD-10-CM

## 2021-05-30 DIAGNOSIS — E669 Obesity, unspecified: Secondary | ICD-10-CM

## 2021-05-30 DIAGNOSIS — E1169 Type 2 diabetes mellitus with other specified complication: Secondary | ICD-10-CM | POA: Diagnosis not present

## 2021-05-30 DIAGNOSIS — H6120 Impacted cerumen, unspecified ear: Secondary | ICD-10-CM

## 2021-05-30 LAB — POCT GLYCOSYLATED HEMOGLOBIN (HGB A1C): Hemoglobin A1C: 8.5 % — AB (ref 4.0–5.6)

## 2021-05-30 NOTE — Patient Instructions (Signed)
Fill out the pre/post meal grid and send me that.   Plan on recheck in about 3 months with A1c at the visit like today.  Take care.  Glad to see you.

## 2021-05-30 NOTE — Progress Notes (Signed)
This visit occurred during the SARS-CoV-2 public health emergency.  Safety protocols were in place, including screening questions prior to the visit, additional usage of staff PPE, and extensive cleaning of exam room while observing appropriate contact time as indicated for disinfecting solutions.  Diabetes:  Using medications without difficulties:  10mg  glipizide, 100mg  januvia, 500mg  metformin.   Hypoglycemic episodes: no Hyperglycemic episodes:no Feet problems:minimal occ tingling in the feet.   Blood Sugars averaging: 140-180s eye exam within last year:yes A1c 8.5.  he was expecting lower.  D/w pt. He had been working on cutting carbs.  He'll fill out pre/post meal grid and we'll go from there.  Weight loss noted with diet.    B ear wax noted.  See exam.  Meds, vitals, and allergies reviewed.   ROS: Per HPI unless specifically indicated in ROS section   GEN: nad, alert and oriented HEENT: ncat, B cerumen impaction noted.  Resolved with irrigation.  Recheck TM B wnl.  Tolerated without complication. NECK: supple w/o LA CV: rrr. PULM: ctab, no inc wob ABD: soft, +bs EXT: no edema SKIN: well perfused.

## 2021-06-02 DIAGNOSIS — H6120 Impacted cerumen, unspecified ear: Secondary | ICD-10-CM | POA: Insufficient documentation

## 2021-06-02 NOTE — Assessment & Plan Note (Signed)
A1c 8.5.  he was expecting lower.  D/w pt. He had been working on cutting carbs.  He'll fill out pre/post meal grid and we'll go from there.  Weight loss noted with diet.  No change in medications at this point.  Continue glipizide Januvia and metformin.

## 2021-06-02 NOTE — Assessment & Plan Note (Signed)
Resolved with irrigation.  Recheck normal.  No complication.  He felt better.

## 2021-06-12 ENCOUNTER — Telehealth: Payer: Self-pay | Admitting: *Deleted

## 2021-06-12 ENCOUNTER — Ambulatory Visit
Admission: EM | Admit: 2021-06-12 | Discharge: 2021-06-12 | Disposition: A | Discharge status: Home / Self Care | Payer: BC Managed Care – PPO | Attending: Emergency Medicine | Admitting: Emergency Medicine

## 2021-06-12 DIAGNOSIS — N3001 Acute cystitis with hematuria: Secondary | ICD-10-CM | POA: Insufficient documentation

## 2021-06-12 DIAGNOSIS — R350 Frequency of micturition: Secondary | ICD-10-CM | POA: Diagnosis not present

## 2021-06-12 DIAGNOSIS — R300 Dysuria: Secondary | ICD-10-CM | POA: Diagnosis not present

## 2021-06-12 DIAGNOSIS — N410 Acute prostatitis: Secondary | ICD-10-CM | POA: Diagnosis not present

## 2021-06-12 LAB — POCT URINALYSIS DIP (MANUAL ENTRY)
Bilirubin, UA: NEGATIVE
Glucose, UA: >=1000 mg/dL — AB
Leukocytes, UA: NEGATIVE
Nitrite, UA: NEGATIVE
Protein Ur, POC: NEGATIVE mg/dL
Spec Grav, UA: 1.01 (ref 1.010–1.025)
Urobilinogen, UA: 1 E.U./dL
pH, UA: 5 (ref 5.0–8.0)

## 2021-06-12 LAB — POCT FASTING CBG KUC MANUAL ENTRY: POCT Glucose (KUC): 272 mg/dL — AB (ref 70–99)

## 2021-06-12 MED ORDER — CIPROFLOXACIN HCL 500 MG PO TABS: 500.0000 mg | ORAL_TABLET | Freq: Two times a day (BID) | ORAL | 0 refills | Status: DC

## 2021-06-12 NOTE — Telephone Encounter (Signed)
PLEASE NOTE: All timestamps contained within this report are represented as [place] Standard Time. CONFIDENTIALTY NOTICE: This fax transmission is intended only for the addressee. It contains information that is legally privileged, confidential or otherwise protected from use or disclosure. If you are not the intended recipient, you are strictly prohibited from reviewing, disclosing, copying using or disseminating any of this information or taking any action in reliance on or regarding this information. If you have received this fax in error, please notify us immediately by telephone so that we can arrange for its return to [place]. Phone: [phone], Toll-Free: [phone], Fax: [phone] Page: 1 of 2 Call Id: [id number] [place] RECORD [facility] Patient Name: Brett Stanley EWS Gender: Male DOB: Aug 05, 1956 Age: 65 Y 37 M 23 D Return Phone Number: [phone] (Primary), [phone] (Secondary) Address: City/ State/ Zip:  [place]  [address] Client [facility] Night - Client Client Site [place] Physician Renford Dills - MD Contact Type Call Who Is Calling Patient / Member / Family / Caregiver Call Type Triage / Clinical Relationship To Patient Self Return Phone Number [phone] (Primary) Chief Complaint Urination Pain Reason for Call Request to Schedule Office Appointment Initial Comment Caller has urination frequency, urgency, pain, and decreased output. Translation No Nurse Assessment Nurse: Clovis Riley, RN, Georgina Peer Date/Time (Eastern Time): 06/12/2021 8:08:22 AM Confirm and document reason for call. If symptomatic, describe symptoms. ---Caller has urination frequency, urgency, pain, and decreased output that started 2 days ago. Also has started having back pain this morning. Slight fever last night. Urine is dark yellow and cloudy and has an odor. Does the  patient have any new or worsening symptoms? ---Yes Will a triage be completed? ---Yes Related visit to physician within the last 2 weeks? ---No Does the PT have any chronic conditions? (i.e. diabetes, asthma, this includes High risk factors for pregnancy, etc.) ---Yes List chronic conditions. ---HTN, diabetes, Is this a behavioral health or substance abuse call? ---No Guidelines Guideline Title Affirmed Question Affirmed Notes Nurse Date/Time Eilene Ghazi Time) Urination Pain - Male Side (flank) or lower back pain present Clovis Riley, RNGeorgina Peer 06/12/2021 8:10:18 AM Disp. Time Eilene Ghazi Time) Disposition Final User 06/12/2021 8:13:28 AM See HCP within 4 Hours (or PCP triage) Yes Clovis Riley, RN, Georgina Peer PLEASE NOTE: All timestamps contained within this report are represented as [place] Standard Time. CONFIDENTIALTY NOTICE: This fax transmission is intended only for the addressee. It contains information that is legally privileged, confidential or otherwise protected from use or disclosure. If you are not the intended recipient, you are strictly prohibited from reviewing, disclosing, copying using or disseminating any of this information or taking any action in reliance on or regarding this information. If you have received this fax in error, please notify us immediately by telephone so that we can arrange for its return to [place]. Phone: [phone], Toll-Free: [phone], Fax: [phone] Page: 2 of 2 Call Id: [id number] [place] Disagree/Comply Comply Caller Understands Yes PreDisposition Did not know what to do Care Advice Given Per Guideline SEE HCP (OR PCP TRIAGE) WITHIN 4 HOURS: * IF OFFICE WILL BE OPEN: You need to be seen within the next 3 or 4 hours. Call your doctor (or NP/PA) now or as soon as the office opens. PAIN MEDICINES: * For pain relief, you can take either acetaminophen, ibuprofen, or naproxen. * ACETAMINOPHEN - REGULAR STRENGTH TYLENOL: Take 650 mg (two 325 mg pills) by mouth  every 4 to 6 hours  as needed. Each Regular Strength Tylenol pill has 325 mg of acetaminophen. The most you should take each day is 3,250 mg (10 pills a day). * IBUPROFEN (E.G., MOTRIN, ADVIL): Take 400 mg (two 200 mg pills) by mouth every 6 hours. The most you should take each day is 1,200 mg (six 200 mg pills), unless your doctor has told you to take more. CARE ADVICE given per Urination Pain - Male (Adult) guideline. CALL BACK IF: * You become worse Referrals REFERRED TO PCP OFFICE

## 2021-06-12 NOTE — Telephone Encounter (Signed)
Patient is currently at an Urgent Care.

## 2021-06-12 NOTE — Telephone Encounter (Signed)
Agree with urgent care evaluation.  I am not in clinic today.  Please get update on patient on Thursday.  Thanks.

## 2021-06-12 NOTE — Discharge Instructions (Addendum)
You are being treated today for possible prostatitis.  We are sending a urine culture to detect any bacteria in your urine.  We will call if that is positive.  Please notify your primary care physician of your symptoms and treatment for prostatitis for further management and evaluation.   Please be aware of the possibility of tendon rupture with taking Cipro.  Avoid strenuous activity while taking antibiotic.  Please go to the hospital if symptoms significantly worsening or if fever worsens.  Please monitor your fever closely.   Please monitor your blood glucose very closely while taking antibiotic and because your blood glucose was elevated in office.  Please notify your primary care physician as needed if blood glucose continues to be elevated in the 200's.

## 2021-06-12 NOTE — ED Triage Notes (Signed)
Pt c/o urinary urgency, frequency, burning, and unable to empty bladder at times x2 days.

## 2021-06-12 NOTE — ED Provider Notes (Addendum)
[facility]    CSN: [id number] Arrival date & time: 06/12/21  0904      History   Chief Complaint Chief Complaint  Patient presents with   Urinary Tract Infection    HPI Brett Stanley is a 65 y.o. male.   Patient reports 2-day duration of urinary frequency burning and incomplete bladder emptying.  Patient does have a history of prostatitis in 2014 where he was treated with Cipro.  Patient states that the symptoms feel similar to his prostatitis in 2014. Denies pain or a lump sensation when sitting.  Denies abdominal pain.  Does have some bilateral lower back pain at times.  Had fever last night of 100.  Denies any penile discharge.  Denies any risky sexual behavior or exposure to STD.  Patient states that he saw his primary care physician recently and was told that his prostate was mildly enlarged at that time.    Urinary Tract Infection  Past Medical History:  Diagnosis Date   Allergy    GERD (gastroesophageal reflux disease)    HTN (hypertension)    Hyperlipidemia    Sleep apnea    uses CPAP irregularly   Tubular adenoma of colon 2010   Type II or unspecified type diabetes mellitus without mention of complication, not stated as uncontrolled     Patient Active Problem List   Diagnosis Date Noted   Cerumen impaction 06/02/2021   Statin myopathy 07/25/2020   Sleep apnea 01/15/2020   Sensorineural hearing loss (SNHL) of both ears 08/30/2018   Dysphagia 08/25/2018   Hearing loss 08/25/2018   GERD (gastroesophageal reflux disease) 08/07/2017   Advance care planning 02/26/2015   Snoring 10/12/2014   Knee pain 03/07/2013   Routine general medical examination at a health care facility 11/16/2012   Morbid obesity ([place]) 06/22/2007   Diabetes mellitus type 2 in obese ([place]) 06/22/2007   HYPERCHOLESTEROLEMIA 06/15/2007   ALLERGIC RHINITIS 06/15/2007    Past Surgical History:  Procedure Laterality Date   COLONOSCOPY     2015   KNEE ARTHROSCOPY Right 2014    VASECTOMY  1997   Dr. Reece Agar       Home Medications    Prior to Admission medications   Medication Sig Start Date End Date Taking? Authorizing Provider  ciprofloxacin (CIPRO) 500 MG tablet Take 1 tablet (500 mg total) by mouth every 12 (twelve) hours for 28 days. 06/12/21 07/10/21 Yes Odis Luster, FNP  fexofenadine (ALLEGRA) 180 MG tablet Take 180 mg by mouth daily.    [provider]  glipiZIDE (GLUCOTROL) 5 MG tablet TAKE 2 TABLETS BY MOUTH DAILY BEFORE BREAKFAST 02/26/21   Tonia Ghent, MD  glucose blood (ONETOUCH VERIO) test strip Check sugar 1 time daily. E11.9 02/26/21   Tonia Ghent, MD  Ibuprofen 200 MG CAPS Take by mouth as needed.     [provider]  lisinopril (ZESTRIL) 5 MG tablet Take 1 tablet (5 mg total) by mouth daily. 02/26/21   Tonia Ghent, MD  metFORMIN (GLUCOPHAGE) 500 MG tablet Take 1 tablet (500 mg total) by mouth daily with breakfast. 02/26/21   Tonia Ghent, MD  sildenafil (REVATIO) 20 MG tablet TAKE 5 TABLETS BY MOUTH DAILY AS NEEDED 02/26/21   Tonia Ghent, MD  sitaGLIPtin (JANUVIA) 100 MG tablet Take 1 tablet (100 mg total) by mouth daily. 02/26/21   Tonia Ghent, MD    Family History Family History  Problem Relation Age of Onset   Alzheimer's  disease Mother    Diabetes Mother    Hypertension Other    Cancer Other        Leukemia   Sleep apnea Sister    Diabetes Maternal Uncle    Depression Maternal Uncle    Stroke Maternal Grandfather    Colon cancer Neg Hx    Prostate cancer Neg Hx    Colon polyps Neg Hx    Esophageal cancer Neg Hx    Rectal cancer Neg Hx    Stomach cancer Neg Hx     Social History Social History   Tobacco Use   Smoking status: Never   Smokeless tobacco: Former  Scientific laboratory technician Use: Never used  Substance Use Topics   Alcohol use: Yes    Alcohol/week: 0.0 standard drinks    Comment: very rare use   Drug use: No     Allergies   Atorvastatin, Erythromycin, Metformin and  related, and Oxycodone   Review of Systems Review of Systems Per HPI  Physical Exam Triage Vital Signs ED Triage Vitals  Enc Vitals Group     BP 06/12/21 1100 136/81     Pulse Rate 06/12/21 1100 (!) 102     Resp 06/12/21 1100 20     Temp 06/12/21 1100 98.6 F (37 C)     Temp Source 06/12/21 1100 Oral     SpO2 06/12/21 1100 94 %     Weight --      Height --      Head Circumference --      Peak Flow --      Pain Score 06/12/21 1103 0     Pain Loc --      Pain Edu? --      Excl. in [place]? --    No data found.  Updated Vital Signs BP 136/81 (BP Location: Left Arm)   Pulse (!) 102   Temp 98.6 F (37 C) (Oral)   Resp 20   SpO2 95%   Visual Acuity Right Eye Distance:   Left Eye Distance:   Bilateral Distance:    Right Eye Near:   Left Eye Near:    Bilateral Near:     Physical Exam Constitutional:      Appearance: Normal appearance.  HENT:     Head: Normocephalic and atraumatic.  Eyes:     Extraocular Movements: Extraocular movements intact.     Conjunctiva/sclera: Conjunctivae normal.  Pulmonary:     Effort: Pulmonary effort is normal.  Musculoskeletal:        General: No tenderness. Normal range of motion.  Neurological:     General: No focal deficit present.     Mental Status: He is alert and oriented to person, place, and time. Mental status is at baseline.  Psychiatric:        Mood and Affect: Mood normal.        Behavior: Behavior normal.        Thought Content: Thought content normal.        Judgment: Judgment normal.     UC Treatments / Results  Labs (all labs ordered are listed, but only abnormal results are displayed) Labs Reviewed  POCT URINALYSIS DIP (MANUAL ENTRY) - Abnormal; Notable for the following components:      Result Value   Glucose, UA >=1,000 (*)    Ketones, POC UA trace (5) (*)    Blood, UA trace-intact (*)    All other components within normal limits  POCT FASTING CBG  [facility] MANUAL ENTRY - Abnormal; Notable for the following  components:   POCT Glucose ([facility]) 272 (*)    All other components within normal limits  URINE CULTURE    EKG   Radiology No results found.  Procedures Procedures (including critical care time)  Medications Ordered in UC Medications - No data to display  Initial Impression / Assessment and Plan / UC Course  I have reviewed the triage vital signs and the nursing notes.  Pertinent labs & imaging results that were available during my care of the patient were reviewed by me and considered in my medical decision making (see chart for details).     Due to high risk of prostatitis, enlarged prostate at previous primary care visit, and clinical symptoms will treat with a longer duration of Cipro for possible prostatitis as opposed to simply urinary tract infection.  Urinalysis not definitive for urinary tract infection.Urine culture pending.  We will call and treat appropriately for positive result.  Patient advised of risk of tendon rupture with Cipro and voiced understanding.  Glucose present on urinalysis.  Blood glucose in clinic was in the 200s.  Patient was advised to closely monitor blood glucose daily and to follow-up with primary care if blood glucose continues to be in the 200s.  Monitor fevers closely.  Patient was advised to seek care at the emergency department if symptoms significantly worsen or if fever worsens.  Follow-up with primary care physician for further management and evaluation of prostatitis. Discussed strict return precautions. Patient verbalized understanding and is agreeable with plan.  Final Clinical Impressions(s) / UC Diagnoses   Final diagnoses:  Acute cystitis with hematuria  Acute prostatitis  Urinary frequency  Dysuria     Discharge Instructions      You are being treated today for possible prostatitis.  We are sending a urine culture to detect any bacteria in your urine.  We will call if that is positive.  Please notify your primary care physician of  your symptoms and treatment for prostatitis for further management and evaluation.   Please be aware of the possibility of tendon rupture with taking Cipro.  Avoid strenuous activity while taking antibiotic.  Please go to the hospital if symptoms significantly worsening or if fever worsens.  Please monitor your fever closely.   Please monitor your blood glucose very closely while taking antibiotic and because your blood glucose was elevated in office.  Please notify your primary care physician as needed if blood glucose continues to be elevated in the 200's.      ED Prescriptions     Medication Sig Dispense Auth. Provider   ciprofloxacin (CIPRO) 500 MG tablet Take 1 tablet (500 mg total) by mouth every 12 (twelve) hours for 28 days. 56 tablet Odis Luster, FNP      PDMP not reviewed this encounter.   Odis Luster, FNP 06/12/21 [place], [place], FNP 06/12/21 1141

## 2021-06-13 ENCOUNTER — Telehealth: Payer: Self-pay | Admitting: *Deleted

## 2021-06-13 MED ORDER — SULFAMETHOXAZOLE-TRIMETHOPRIM 800-160 MG PO TABS: 1.0000 | ORAL_TABLET | Freq: Two times a day (BID) | ORAL | 0 refills | Status: DC

## 2021-06-13 NOTE — Addendum Note (Signed)
Addended by: Tonia Ghent on: 06/13/2021 04:59 PM   Modules accepted: Orders

## 2021-06-13 NOTE — Telephone Encounter (Signed)
Patient notified rx was changed and sent. Advised to call back if he can not tolerate it. Advised to call back next week with update.

## 2021-06-13 NOTE — Telephone Encounter (Signed)
Called patient to check on him this afternoon. Patient went to [facility] and was given 28 day supply of cipro to take BID. The cipro is tearing his stomach up and he wants to know if he can maybe cut the dose down or take something else. Patient is going out of town tomorrow around lunch time and does not want to try to drive for hrs with diarrhea.

## 2021-06-13 NOTE — Telephone Encounter (Signed)
Ucx is pending.  I would change to septra for 2 weeks.  Rx sent.  He may need longer rx but I wanted to see if he can tolerate that.  Have him update [place] next week.  If worse, seek eval.  Thanks.

## 2021-06-13 NOTE — Telephone Encounter (Signed)
Pt reports having diarrhea after taking Cipro yesterday.  Discussed w/ H. Vickki Muff, NP and H. Wieters, PA: informed pt to take each dose with food, recommended probiotics and Activia yogurt, drinking plenty of PO fluids; informed pt that if he has multiple episodes of diarrhea in a day, that he may take a little Imodium, but if he is only having a few episodes of diarrhea each day, to hold off on taking any Imodium.  Pt inquired if he could skip his AM dose tomorrow due to traveling for vacation; informed pt we do not recommend skipping any doses, and discussed timing of the abx with regards to his travel plans.  Informed pt that if he continues to have diarrhea problems after finishing the abx, to call [facility], per H. Wieters instructions.  Pt verbalized understanding of all above instructions.

## 2021-06-15 LAB — URINE CULTURE: Culture: >=100000 — AB

## 2021-06-26 ENCOUNTER — Telehealth: Payer: Self-pay

## 2021-06-26 MED ORDER — SULFAMETHOXAZOLE-TRIMETHOPRIM 800-160 MG PO TABS: 1.0000 | ORAL_TABLET | Freq: Two times a day (BID) | ORAL | 0 refills | Status: DC

## 2021-06-26 NOTE — Telephone Encounter (Signed)
I sent 2 more weeks of antibiotics.  If he has complete resolution of symptoms after 1 more week, he would not need to finish the full prescription.  Please update [place] as needed.  Thanks.

## 2021-06-26 NOTE — Telephone Encounter (Signed)
Pt said had UTI and prostatitis and was given abx and pt will take last abx on 06/27/21; pt said that he can see a lot of improvement; no burning or pain upon urination now; the feeling of urgency is gone. No fever and no abd or back pain and no blood.  Pt does have a good stream of urine now. Pt said the frequency of urine is better but still not normal. Pt said wonders if should have few more days of abx to completely clear all symptoms. Now the main symptoms still having is the frequency of urine which again is better but not cleared. Pt request cb after reviewed by Dr Damita Dunnings. [facility].

## 2021-06-27 NOTE — Telephone Encounter (Signed)
I gave pt message below. Pt voiced understanding, and agrees with plan to pick up Abx.

## 2021-08-30 ENCOUNTER — Ambulatory Visit: Payer: BC Managed Care – PPO | Admitting: Family Medicine

## 2021-09-02 ENCOUNTER — Ambulatory Visit: Payer: BC Managed Care – PPO | Admitting: Family Medicine

## 2021-09-02 ENCOUNTER — Other Ambulatory Visit: Payer: Self-pay

## 2021-09-02 ENCOUNTER — Encounter: Payer: Self-pay | Admitting: Family Medicine

## 2021-09-02 VITALS — BP 118/78 | HR 80 | Temp 97.6°F | Ht 69.0 in | Wt 295.0 lb

## 2021-09-02 DIAGNOSIS — E1169 Type 2 diabetes mellitus with other specified complication: Secondary | ICD-10-CM | POA: Diagnosis not present

## 2021-09-02 DIAGNOSIS — E669 Obesity, unspecified: Secondary | ICD-10-CM | POA: Diagnosis not present

## 2021-09-02 DIAGNOSIS — Z23 Encounter for immunization: Secondary | ICD-10-CM

## 2021-09-02 LAB — POCT GLYCOSYLATED HEMOGLOBIN (HGB A1C): Hemoglobin A1C: 8.3 % — AB (ref 4.0–5.6)

## 2021-09-02 NOTE — Progress Notes (Signed)
This visit occurred during the SARS-CoV-2 public health emergency.  Safety protocols were in place, including screening questions prior to the visit, additional usage of staff PPE, and extensive cleaning of exam room while observing appropriate contact time as indicated for disinfecting solutions.  Diabetes:  Using medications without difficulties: yes Hypoglycemic episodes: no Hyperglycemic episodes:no Feet problems: no Blood Sugars averaging: not checked often eye exam within last year: yes A1c 8.3.   See below re: job.    Eat right diet d/w pt, handout given to patient.   He is going to retire this fall but it isn't public knowledge yet.  He is looking forward to the change.  He expects his diet to dramatically improve with the job change and that should help his sugar.  And he expects his exercise situation to improve.    Meds, vitals, and allergies reviewed.   ROS: Per HPI unless specifically indicated in ROS section   GEN: nad, alert and oriented HEENT: ncat NECK: supple w/o LA CV: rrr. PULM: ctab, no inc wob ABD: soft, +bs EXT: no edema SKIN: well perfused.

## 2021-09-02 NOTE — Patient Instructions (Addendum)
Flu shot today.  Keep working on diet and exercise and recheck with A1c at a visit in about 4 months.  Take care.  Glad to see you.

## 2021-09-04 NOTE — Assessment & Plan Note (Signed)
A1c 8.3.   See below re: job.    Eat right diet d/w pt, handout given to patient.   He is going to retire this fall but it isn't public knowledge yet.  He is looking forward to the change.  He expects his diet to dramatically improve with the job change and that should help his sugar.  And he expects his exercise situation to improve.    We both think it makes sense to continue glipizide metformin and Januvia, have him work on his diet is much as possible once he retires and then recheck his labs after that visit.  He will update me as needed.

## 2022-01-09 ENCOUNTER — Ambulatory Visit: Payer: BC Managed Care – PPO | Admitting: Family Medicine

## 2022-01-13 ENCOUNTER — Other Ambulatory Visit: Payer: Self-pay

## 2022-01-13 ENCOUNTER — Encounter: Payer: Self-pay | Admitting: Family Medicine

## 2022-01-13 ENCOUNTER — Ambulatory Visit: Payer: BC Managed Care – PPO | Admitting: Family Medicine

## 2022-01-13 VITALS — BP 132/80 | HR 95 | Temp 97.6°F | Ht 69.0 in | Wt 293.0 lb

## 2022-01-13 DIAGNOSIS — M25569 Pain in unspecified knee: Secondary | ICD-10-CM | POA: Diagnosis not present

## 2022-01-13 DIAGNOSIS — E669 Obesity, unspecified: Secondary | ICD-10-CM

## 2022-01-13 DIAGNOSIS — E1169 Type 2 diabetes mellitus with other specified complication: Secondary | ICD-10-CM

## 2022-01-13 LAB — POCT GLYCOSYLATED HEMOGLOBIN (HGB A1C): Hemoglobin A1C: 8.8 % — AB (ref 4.0–5.6)

## 2022-01-13 MED ORDER — DICLOFENAC SODIUM 1 % EX GEL: 2.0000 g | Freq: Every day | CUTANEOUS | 3 refills | Status: DC | PRN

## 2022-01-13 MED ORDER — SILDENAFIL CITRATE 20 MG PO TABS: ORAL_TABLET | ORAL | 12 refills | Status: DC

## 2022-01-13 MED ORDER — METFORMIN HCL 500 MG PO TABS: 500.0000 mg | ORAL_TABLET | Freq: Every day | ORAL | 3 refills | Status: DC

## 2022-01-13 MED ORDER — ONETOUCH VERIO VI STRP: ORAL_STRIP | 3 refills | Status: DC

## 2022-01-13 MED ORDER — SITAGLIPTIN PHOSPHATE 100 MG PO TABS: 100.0000 mg | ORAL_TABLET | Freq: Every day | ORAL | 3 refills | Status: DC

## 2022-01-13 MED ORDER — GLIPIZIDE 5 MG PO TABS: ORAL_TABLET | ORAL | 3 refills | Status: DC

## 2022-01-13 MED ORDER — LISINOPRIL 5 MG PO TABS: 5.0000 mg | ORAL_TABLET | Freq: Every day | ORAL | 3 refills | Status: DC

## 2022-01-13 NOTE — Progress Notes (Signed)
This visit occurred during the SARS-CoV-2 public health emergency.  Safety protocols were in place, including screening questions prior to the visit, additional usage of staff PPE, and extensive cleaning of exam room while observing appropriate contact time as indicated for disinfecting solutions.  Diabetes:  Using medications without difficulties: yes Hypoglycemic episodes: no Hyperglycemic episodes:no Feet problems:no Blood Sugars averaging: variable 130-140, up to 180s.   eye exam within last year: yes but due for f/u soon.   A1c up, d/w pt at [place].    We talked about potentially adding insulin at next OV, we talked about daily use with an insulin pen.    We talked about his R knee pain, more pain/stiffness after walking about 30-45 min.    He retired in the meantime.  D/w pt about exercise routine.  He may join the [employer].  He is trying to adjust to retirement.    Meds, vitals, and allergies reviewed.  ROS: Per HPI unless specifically indicated in ROS section   GEN: nad, alert and oriented HEENT: ncat NECK: supple w/o LA CV: rrr. PULM: ctab, no inc wob ABD: soft, +bs EXT: no edema SKIN: no acute rash R knee slightly puffy but not bruised.    Diabetic foot exam: Normal inspection No skin breakdown No calluses  Normal DP pulses Normal sensation to light touch and monofilament Nails normal

## 2022-01-13 NOTE — Patient Instructions (Addendum)
Keep working on diet and exercise.  If your a1c isn't getting better, then we can see about other options.  Plan on recheck in about 3 months at a yearly visit with labs ahead of time if possible.   Try diclofenac gel for your knee pain.  Try icing for about 5 minutes.   Take care.  Glad to see you.

## 2022-01-15 NOTE — Assessment & Plan Note (Signed)
Discussed the patient about options.  He is retired.  He can work on diet and exercise.  We can recheck in a few months.  If his A1c is not getting better we can see about once daily insulin use.  See above.  Continue glipizide Januvia and metformin in the meantime.

## 2022-01-15 NOTE — Assessment & Plan Note (Signed)
He can try icing and diclofenac gel and update me as needed.

## 2022-03-17 ENCOUNTER — Ambulatory Visit: Payer: BC Managed Care – PPO | Admitting: Family

## 2022-03-17 ENCOUNTER — Encounter: Payer: Self-pay | Admitting: Family

## 2022-03-17 VITALS — BP 118/74 | HR 80 | Temp 99.0°F | Resp 16 | Ht 69.0 in | Wt 296.2 lb

## 2022-03-17 DIAGNOSIS — L089 Local infection of the skin and subcutaneous tissue, unspecified: Secondary | ICD-10-CM | POA: Diagnosis not present

## 2022-03-17 DIAGNOSIS — S30860A Insect bite (nonvenomous) of lower back and pelvis, initial encounter: Secondary | ICD-10-CM | POA: Diagnosis not present

## 2022-03-17 DIAGNOSIS — L299 Pruritus, unspecified: Secondary | ICD-10-CM | POA: Diagnosis not present

## 2022-03-17 DIAGNOSIS — W57XXXA Bitten or stung by nonvenomous insect and other nonvenomous arthropods, initial encounter: Secondary | ICD-10-CM

## 2022-03-17 MED ORDER — DOXYCYCLINE HYCLATE 100 MG PO TABS: 100.0000 mg | ORAL_TABLET | Freq: Two times a day (BID) | ORAL | 0 refills | Status: AC

## 2022-03-17 MED ORDER — TRIAMCINOLONE ACETONIDE 0.1 % EX CREA: 1.0000 "application " | TOPICAL_CREAM | Freq: Two times a day (BID) | CUTANEOUS | 1 refills | Status: DC

## 2022-03-17 NOTE — Assessment & Plan Note (Signed)
Tick bite prophylaxis going to initiate as within 72 hours.  ?Going to treat however for abscess as well as irritation noted.  ?

## 2022-03-17 NOTE — Progress Notes (Signed)
? ?Established Patient Office Visit ? ?Subjective:  ?Patient ID: Brett Stanley, male    DOB: 09-Sep-1956  Age: 66 y.o. MRN: [id number] ? ?CC:  ?Chief Complaint  ?Patient presents with  ? Mass  ?  Pulled tick off Friday evening red and raised.  ? ? ?HPI ?Brett Stanley is here today with concerns.  ? ?Friday he noticed a tick on her right buttock. His wife pulled the tick off, and noticed red spot around it. Wife states the tick was swollen with blood and burrowed down into a hole. He knew the tick was there because it was very itchy and irritating.  ? ?Past Medical History:  ?Diagnosis Date  ? Allergy   ? GERD (gastroesophageal reflux disease)   ? HTN (hypertension)   ? Hyperlipidemia   ? Sleep apnea   ? uses CPAP irregularly  ? Tubular adenoma of colon 2010  ? Type II or unspecified type diabetes mellitus without mention of complication, not stated as uncontrolled   ? ? ?Past Surgical History:  ?Procedure Laterality Date  ? COLONOSCOPY    ? 2015  ? KNEE ARTHROSCOPY Right 2014  ? VASECTOMY  1997  ? Dr. Reece Agar  ? ? ?Family History  ?Problem Relation Age of Onset  ? Alzheimer's disease Mother   ? Diabetes Mother   ? Hypertension Other   ? Cancer Other   ?     Leukemia  ? Sleep apnea Sister   ? Diabetes Maternal Uncle   ? Depression Maternal Uncle   ? Stroke Maternal Grandfather   ? Colon cancer Neg Hx   ? Prostate cancer Neg Hx   ? Colon polyps Neg Hx   ? Esophageal cancer Neg Hx   ? Rectal cancer Neg Hx   ? Stomach cancer Neg Hx   ? ? ?Social History  ? ?Socioeconomic History  ? Marital status: Married  ?  Spouse name: Not on file  ? Number of children: 2  ? Years of education: Not on file  ? Highest education level: Not on file  ?Occupational History  ? Occupation: Physicist, medical  ?  Employer: [employer]  ?Tobacco Use  ? Smoking status: Never  ? Smokeless tobacco: Former  ?Vaping Use  ? Vaping Use: Never used  ?Substance and Sexual Activity  ? Alcohol use: Yes  ?  Alcohol/week: 0.0 standard drinks   ?  Comment: very rare use  ? Drug use: No  ? Sexual activity: Not on file  ?Other Topics Concern  ? Not on file  ?Social History Narrative  ? Married 1988  ? 2 sons  ? Retired as Government social research officer for [employer]  ? ?Social Determinants of Health  ? ?Financial Resource Strain: Not on file  ?Food Insecurity: Not on file  ?Transportation Needs: Not on file  ?Physical Activity: Not on file  ?Stress: Not on file  ?Social Connections: Not on file  ?Intimate Partner Violence: Not on file  ? ? ?Outpatient Medications Prior to Visit  ?Medication Sig Dispense Refill  ? diclofenac Sodium (VOLTAREN) 1 % GEL Apply 2 g topically daily as needed. 100 g 3  ? fexofenadine (ALLEGRA) 180 MG tablet Take 180 mg by mouth daily.    ? fluticasone (FLONASE) 50 MCG/ACT nasal spray Place into both nostrils daily.    ? glipiZIDE (GLUCOTROL) 5 MG tablet TAKE 2 TABLETS BY MOUTH DAILY BEFORE BREAKFAST 180 tablet 3  ? glucose blood (ONETOUCH VERIO) test strip Check sugar 1  time daily. E11.9 100 each 3  ? Ibuprofen 200 MG CAPS Take by mouth as needed.     ? lisinopril (ZESTRIL) 5 MG tablet Take 1 tablet (5 mg total) by mouth daily. 90 tablet 3  ? metFORMIN (GLUCOPHAGE) 500 MG tablet Take 1 tablet (500 mg total) by mouth daily with breakfast. 90 tablet 3  ? sildenafil (REVATIO) 20 MG tablet TAKE 5 TABLETS BY MOUTH DAILY AS NEEDED 50 tablet 12  ? sitaGLIPtin (JANUVIA) 100 MG tablet Take 1 tablet (100 mg total) by mouth daily. 90 tablet 3  ? triamcinolone cream (KENALOG) 0.1 % Apply 1 application. topically 2 (two) times daily.    ? ?No facility-administered medications prior to visit.  ? ? ?Allergies  ?Allergen Reactions  ? Atorvastatin Other (See Comments)  ?  Joint aches  ? Erythromycin Nausea Only  ? Metformin And Related Other (See Comments)  ?  Tiran tolerated dose is '500mg'$  a day.   ? Oxycodone   ?  Nausea but he can tolerate hydrocodone  ? ? ?ROS ?Review of Systems  ?Constitutional:  Negative for chills, fatigue and fever.   ?Cardiovascular:  Negative for chest pain, palpitations and leg swelling.  ?Skin:  Positive for rash (small red papular on right lower inner groin, nontender, larger tender red warm rash on right lower buttock).  ? ?  ?Objective:  ?  ?Physical Exam ?Constitutional:   ?   General: He is not in acute distress. ?   Appearance: He is obese. He is not ill-appearing, toxic-appearing or diaphoretic.  ?Pulmonary:  ?   Effort: Pulmonary effort is normal.  ?Skin: ?   Findings: Abscess (right lower buttock with red warm abscess with induration approximately 2.5 cm diameter with center puncture site, no evidence of foreign body tick still present), erythema and rash (red small 0.5 cm diameter warm rash right lower groin) present.  ? ?    ?Neurological:  ?   Mental Status: He is alert.  ? ? ?BP 118/74   Pulse 80   Temp 99 ?F (37.2 ?C)   Resp 16   Ht '5\' 9"'$  (1.753 m)   Wt 296 lb 4 oz (134.4 kg)   SpO2 93%   BMI 43.75 kg/m?  ?Wt Readings from Last 3 Encounters:  ?03/17/22 296 lb 4 oz (134.4 kg)  ?01/13/22 293 lb (132.9 kg)  ?09/02/21 295 lb (133.8 kg)  ? ? ? ?Health Maintenance Due  ?Topic Date Due  ? COVID-19 Vaccine (4 - Booster for [place] series) 12/05/2020  ? OPHTHALMOLOGY EXAM  01/14/2022  ? ? ?There are no preventive care reminders to display for this patient. ? ?Lab Results  ?Component Value Date  ? TSH 2.97 02/13/2009  ? ?No results found for: WBC, HGB, HCT, MCV, PLT ?Lab Results  ?Component Value Date  ? NA 137 02/19/2021  ? K 4.3 02/19/2021  ? CO2 25 02/19/2021  ? GLUCOSE 168 (H) 02/19/2021  ? BUN 14 02/19/2021  ? CREATININE 0.86 02/19/2021  ? BILITOT 0.7 02/19/2021  ? ALKPHOS 45 02/19/2021  ? AST 15 02/19/2021  ? ALT 14 02/19/2021  ? PROT 6.5 02/19/2021  ? ALBUMIN 3.7 02/19/2021  ? CALCIUM 8.7 02/19/2021  ? GFR 92.21 02/19/2021  ? ?Lab Results  ?Component Value Date  ? HGBA1C 8.8 (A) 01/13/2022  ? ? ?  ?Assessment & Plan:  ? ?Problem List Items Addressed This Visit   ? ?  ? Musculoskeletal and Integument  ? Tick  bite - Primary  ?  Tick bite prophylaxis going to initiate as within 72 hours.  ?Going to treat however for abscess as well as irritation noted.  ?  ?  ? Relevant Medications  ? doxycycline (VIBRA-TABS) 100 MG tablet  ? Infected tick bite of buttock  ?  rx doxycycline 100 mg po bid x 14 days  ?Monitor for worsening s/s infection ?F/u if sx worsen or fail to improve ?  ?  ?  ? Other  ? Pruritus  ?  otc benadryl topical may be helpful ?Refill for triamincinolone 0.1% cream sent to pharmacy  ?  ?  ? Relevant Medications  ? triamcinolone cream (KENALOG) 0.1 %  ? ? ?Meds ordered this encounter  ?Medications  ? doxycycline (VIBRA-TABS) 100 MG tablet  ?  Sig: Take 1 tablet (100 mg total) by mouth 2 (two) times daily for 14 days.  ?  Dispense:  28 tablet  ?  Refill:  0  ?  Order Specific Question:   Supervising Provider  ?  Answer:   BEDSOLE, AMY E [[id number]]  ? triamcinolone cream (KENALOG) 0.1 %  ?  Sig: Apply 1 application. topically 2 (two) times daily.  ?  Dispense:  30 g  ?  Refill:  1  ?  Order Specific Question:   Supervising Provider  ?  Answer:   BEDSOLE, AMY E [[id number]]  ? ? ?Follow-up: No follow-ups on file.  ? ? ?Eugenia Pancoast, FNP ?

## 2022-03-17 NOTE — Assessment & Plan Note (Signed)
otc benadryl topical may be helpful ?Refill for triamincinolone 0.1% cream sent to pharmacy  ?

## 2022-03-17 NOTE — Patient Instructions (Signed)
It was a pleasure seeing you today! Please do not hesitate to reach out with any questions and or concerns. ? ?Regards,  ? ?Zyanne Schumm ?FNP-C ? ?

## 2022-03-17 NOTE — Assessment & Plan Note (Signed)
rx doxycycline 100 mg po bid x 14 days  ?Monitor for worsening s/s infection ?F/u if sx worsen or fail to improve ?

## 2022-03-19 ENCOUNTER — Other Ambulatory Visit: Payer: Self-pay | Admitting: Family Medicine

## 2022-03-19 DIAGNOSIS — E1169 Type 2 diabetes mellitus with other specified complication: Secondary | ICD-10-CM

## 2022-04-03 ENCOUNTER — Other Ambulatory Visit: Payer: Self-pay | Admitting: Family Medicine

## 2022-04-03 DIAGNOSIS — E669 Obesity, unspecified: Secondary | ICD-10-CM

## 2022-04-03 DIAGNOSIS — Z125 Encounter for screening for malignant neoplasm of prostate: Secondary | ICD-10-CM

## 2022-04-09 ENCOUNTER — Other Ambulatory Visit (INDEPENDENT_AMBULATORY_CARE_PROVIDER_SITE_OTHER): Payer: BC Managed Care – PPO

## 2022-04-09 DIAGNOSIS — Z125 Encounter for screening for malignant neoplasm of prostate: Secondary | ICD-10-CM | POA: Diagnosis not present

## 2022-04-09 DIAGNOSIS — E1169 Type 2 diabetes mellitus with other specified complication: Secondary | ICD-10-CM | POA: Diagnosis not present

## 2022-04-09 DIAGNOSIS — E669 Obesity, unspecified: Secondary | ICD-10-CM | POA: Diagnosis not present

## 2022-04-09 LAB — LIPID PANEL
Cholesterol: 201 mg/dL — ABNORMAL HIGH (ref 0–200)
HDL: 55.6 mg/dL (ref >39.00)
LDL Cholesterol: 131 mg/dL — ABNORMAL HIGH (ref 0–99)
NonHDL: 145.44
Total CHOL/HDL Ratio: 4
Triglycerides: 70 mg/dL (ref 0.0–149.0)
VLDL: 14 mg/dL (ref 0.0–40.0)

## 2022-04-09 LAB — COMPREHENSIVE METABOLIC PANEL
ALT: 15 U/L (ref 0–53)
AST: 14 U/L (ref 0–37)
Albumin: 3.9 g/dL (ref 3.5–5.2)
Alkaline Phosphatase: 45 U/L (ref 39–117)
BUN: 13 mg/dL (ref 6–23)
CO2: 27 mEq/L (ref 19–32)
Calcium: 8.6 mg/dL (ref 8.4–10.5)
Chloride: 100 mEq/L (ref 96–112)
Creatinine, Ser: 0.76 mg/dL (ref 0.40–1.50)
GFR: 94.96 mL/min (ref >60.00)
Glucose, Bld: 219 mg/dL — ABNORMAL HIGH (ref 70–99)
Potassium: 4.4 mEq/L (ref 3.5–5.1)
Sodium: 136 mEq/L (ref 135–145)
Total Bilirubin: 0.7 mg/dL (ref 0.2–1.2)
Total Protein: 6.6 g/dL (ref 6.0–8.3)

## 2022-04-09 LAB — PSA: PSA: 2.57 ng/mL (ref 0.10–4.00)

## 2022-04-09 LAB — HEMOGLOBIN A1C: Hgb A1c MFr Bld: 9.4 % — ABNORMAL HIGH (ref 4.6–6.5)

## 2022-04-14 ENCOUNTER — Encounter: Payer: Self-pay | Admitting: Family Medicine

## 2022-04-14 ENCOUNTER — Ambulatory Visit (INDEPENDENT_AMBULATORY_CARE_PROVIDER_SITE_OTHER): Payer: BC Managed Care – PPO | Admitting: Family Medicine

## 2022-04-14 VITALS — BP 124/80 | HR 80 | Temp 97.3°F | Ht 69.0 in | Wt 294.0 lb

## 2022-04-14 DIAGNOSIS — L989 Disorder of the skin and subcutaneous tissue, unspecified: Secondary | ICD-10-CM

## 2022-04-14 DIAGNOSIS — E669 Obesity, unspecified: Secondary | ICD-10-CM

## 2022-04-14 DIAGNOSIS — G4733 Obstructive sleep apnea (adult) (pediatric): Secondary | ICD-10-CM

## 2022-04-14 DIAGNOSIS — G720 Drug-induced myopathy: Secondary | ICD-10-CM

## 2022-04-14 DIAGNOSIS — Z0000 Encounter for general adult medical examination without abnormal findings: Secondary | ICD-10-CM

## 2022-04-14 DIAGNOSIS — Z7189 Other specified counseling: Secondary | ICD-10-CM

## 2022-04-14 MED ORDER — DICLOFENAC SODIUM 1 % EX GEL: 2.0000 g | Freq: Four times a day (QID) | CUTANEOUS | Status: DC

## 2022-04-14 MED ORDER — OZEMPIC (0.25 OR 0.5 MG/DOSE) 2 MG/1.5ML ~~LOC~~ SOPN: 0.2500 mg | PEN_INJECTOR | SUBCUTANEOUS | 1 refills | Status: DC

## 2022-04-14 NOTE — Progress Notes (Signed)
CPE- See plan.  Routine anticipatory guidance given to patient.  See health maintenance.  The possibility exists that previously documented standard health maintenance information may have been brought forward from a previous encounter into this note.  If needed, that same information has been updated to reflect the current situation based on today's encounter.   ? ?Tetanus 2018 ?Flu 2022 ?PNA 2014 ?Shingles done ?covid vaccine prev done.   ?Colonoscopy 2021 ?Prostate cancer screening d/w pt.  PSA wnl.   ?Living will d/w pt.  Wife designated if patient were incapacitated.  ? ?Diabetes:  ?Using medications without difficulties: yes ?Hypoglycemic episodes:no ?Hyperglycemic episodes: no ?Feet problems:no ?Blood Sugars averaging: 160-180 in the AM.   ?eye exam within last year: f/u pending.   ?Intolerant of atorvastatin with aches.   ?He is cutting out carbs at baseline.   ?D/w pt about exercise, getting back to walking.   ? ?R ear sx.  Felt clogged, some better this AM.   ? ?OSA.  D/w pt about restarting CPAP.  Had been off for about 2 years.   ? ?PMH and SH reviewed ? ?Meds, vitals, and allergies reviewed.  ? ?ROS: Per HPI.  Unless specifically indicated otherwise in HPI, the patient denies: ? ?General: fever. ?Eyes: acute vision changes ?ENT: sore throat ?Cardiovascular: chest pain ?Respiratory: SOB ?GI: vomiting ?GU: dysuria ?Musculoskeletal: acute back pain ?Derm: acute rash ?Neuro: acute motor dysfunction ?Psych: worsening mood ?Endocrine: polydipsia ?Heme: bleeding ?Allergy: hayfever ? ?GEN: nad, alert and oriented ?HEENT: mucous membranes moist, minimal cerumen in the canals.  Not impacted. ?NECK: supple w/o LA ?CV: rrr. ?PULM: ctab, no inc wob ?ABD: soft, +bs ?EXT: no edema ?SKIN: no acute rash but persistent 34m irritated area on the L nostril (with derm f/u pending) ? ?Diabetic foot exam: ?Normal inspection ?No skin breakdown ?No calluses  ?Normal DP pulses ?Normal sensation to light touch and  monofilament ?Nails normal ?

## 2022-04-14 NOTE — Patient Instructions (Addendum)
We'll call about seeing pulmonary about sleep apnea.  ?Plan on recheck in about 3 months with A1c at the visit. You don't have to fast.  ?Take care.  Glad to see you. ?Stop januvia if you can get ozempic filled.  Start with 0.'25mg'$  weekly for 1 month.  Update me at the end of the month, sooner if needed.  If you have abdominal pain on the medicine, then stop it and let me know.   ?If you have trouble with ozempic injection, then let us know.   ?Check with dermatology about the spot on your nose.   ?

## 2022-04-16 DIAGNOSIS — L989 Disorder of the skin and subcutaneous tissue, unspecified: Secondary | ICD-10-CM | POA: Insufficient documentation

## 2022-04-16 NOTE — Assessment & Plan Note (Signed)
Living will d/w pt.  Wife designated if patient were incapacitated.   ?

## 2022-04-16 NOTE — Assessment & Plan Note (Signed)
He is going to follow-up with dermatology about the irritated lesion on his nose. ?

## 2022-04-16 NOTE — Assessment & Plan Note (Signed)
Intolerant of atorvastatin with aches.   ?He is cutting out carbs at baseline.   ?D/w pt about exercise, getting back to walking.   ?Plan on recheck in 3 months.  If he can get Ozempic filled then stop Januvia.  Routine Ozempic dosing and rationale for use discussed with patient.  Routine cautions given regarding thyroid and pancreatitis.  He agrees with plan.  Continue glipizide metformin for now. ?

## 2022-04-16 NOTE — Assessment & Plan Note (Signed)
Tetanus 2018 ?Flu 2022 ?PNA 2014 ?Shingles done ?covid vaccine prev done.   ?Colonoscopy 2021 ?Prostate cancer screening d/w pt.  PSA wnl.   ?Living will d/w pt.  Wife designated if patient were incapacitated.  ?

## 2022-04-16 NOTE — Assessment & Plan Note (Signed)
History of statin intolerance due to muscle aches. ?

## 2022-04-16 NOTE — Assessment & Plan Note (Signed)
Refer back to pulmonary.  Discussed how pulmonary can affect diabetes, high blood pressure, etc. ?

## 2022-05-07 ENCOUNTER — Encounter: Payer: Self-pay | Admitting: Primary Care

## 2022-05-07 ENCOUNTER — Ambulatory Visit (INDEPENDENT_AMBULATORY_CARE_PROVIDER_SITE_OTHER): Payer: BC Managed Care – PPO | Admitting: Primary Care

## 2022-05-07 VITALS — BP 126/76 | HR 81 | Temp 97.9°F | Ht 69.0 in | Wt 292.0 lb

## 2022-05-07 DIAGNOSIS — Z8669 Personal history of other diseases of the nervous system and sense organs: Secondary | ICD-10-CM | POA: Diagnosis not present

## 2022-05-07 DIAGNOSIS — G4733 Obstructive sleep apnea (adult) (pediatric): Secondary | ICD-10-CM | POA: Diagnosis not present

## 2022-05-07 NOTE — Progress Notes (Signed)
$'@Patient'X$  ID: Brett Stanley, male    DOB: 04-25-1956, 66 y.o.   MRN: [id number]  Chief Complaint  Patient presents with   Consult    He is on a CPAP but feels the mask is not working well for him.    Referring provider: Tonia Ghent, MD  HPI: 66 year old male, never smoked.  Past medical history significant for apnea, allergic rhinitis, GERD, type 2 diabetes, obesity.     05/07/2022 Patient presents today for sleep consult. He is a former patient of Dr. Halford Chessman, last seen for consult on 10/12/2014 for snoring.  Home sleep study on 01/16/2015 showed severe obstructive sleep apnea, AHI 59.5/hr O2 low 73%.  Order was placed for CPAP back in February 2016 however patient did not follow-up with our office after this. He stopped wearing his CPAP during covid.  He never received new CPAP supplies. No SD card in machine. His mask is not fitting correctly. He has lost 35 lbs in the last year, wife thinks he is sleeping better. He in interested in oral appliance.   Sleep questionnaire Symptoms- Snoring Prior sleep study- Feb 2016 Bedtime- 9:30-10:30pm Time to fall asleep- not long  Nocturnal awakenings- 4-5 times Out of bed/start of day- 6am Weight changes- down 35 lbs Do you operate heavy machinery- No Do you currently wear CPAP- Yes Do you current wear oxygen- No Epworth- 2  Allergies  Allergen Reactions   Atorvastatin Other (See Comments)    Joint aches   Erythromycin Nausea Only   Metformin And Related Other (See Comments)    [place]ta tolerated dose is '500mg'$  a day.    Oxycodone Other (See Comments)    Nausea but he can tolerate hydrocodone    Immunization History  Administered Date(s) Administered   Influenza Whole 09/14/2004, 09/24/2007, 09/01/2009, 09/23/2010   Influenza,inj,Quad PF,6+ Mos 10/08/2015, 08/24/2018, 09/01/2019, 10/10/2020, 09/02/2021   Influenza-Unspecified 09/25/2014, 09/14/2016, 09/14/2017   PFIZER(Purple Top)SARS-COV-2 Vaccination 02/18/2020, 03/10/2020,  10/10/2020, 10/15/2020   Pneumococcal Polysaccharide-23 11/14/2013   Td 02/18/2007   Tdap 04/07/2017   Zoster Recombinat (Shingrix) 01/21/2020, 06/09/2020    Past Medical History:  Diagnosis Date   Allergy    GERD (gastroesophageal reflux disease)    HTN (hypertension)    Hyperlipidemia    Sleep apnea    uses CPAP irregularly   Tubular adenoma of colon 2010   Type II or unspecified type diabetes mellitus without mention of complication, not stated as uncontrolled     Tobacco History: Social History   Tobacco Use  Smoking Status Never  Smokeless Tobacco Former   Counseling given: Not Answered   Outpatient Medications Prior to Visit  Medication Sig Dispense Refill   fexofenadine (ALLEGRA) 180 MG tablet Take 180 mg by mouth daily.     fluticasone (FLONASE) 50 MCG/ACT nasal spray Place into both nostrils daily.     glipiZIDE (GLUCOTROL) 5 MG tablet TAKE 2 TABLETS BY MOUTH DAILY BEFORE BREAKFAST 180 tablet 3   glucose blood (ONETOUCH VERIO) test strip Check sugar 1 time daily. E11.9 100 each 3   Ibuprofen 200 MG CAPS Take by mouth as needed.      lisinopril (ZESTRIL) 5 MG tablet TAKE 1 TABLET BY MOUTH DAILY 90 tablet 3   metFORMIN (GLUCOPHAGE) 500 MG tablet TAKE 1 TABLET BY MOUTH DAILY WITH BREAKFAST 90 tablet 3   Semaglutide,0.25 or 0.'5MG'$ /DOS, (OZEMPIC, 0.25 OR 0.5 MG/DOSE,) 2 MG/1.5ML SOPN Inject 0.25 mg into the skin once a week. 1.5 mL 1   sildenafil (REVATIO)  20 MG tablet TAKE 5 TABLETS BY MOUTH DAILY AS NEEDED 50 tablet 12   diclofenac Sodium (VOLTAREN) 1 % GEL Apply 2 g topically 4 (four) times daily. (Patient not taking: Reported on 05/07/2022)     triamcinolone cream (KENALOG) 0.1 % Apply 1 application. topically 2 (two) times daily. (Patient not taking: Reported on 05/07/2022) 30 g 1   No facility-administered medications prior to visit.   Review of Systems  Review of Systems  Constitutional: Negative.   HENT: Negative.    Respiratory: Negative.     Psychiatric/Behavioral:  Positive for sleep disturbance.     Physical Exam  BP 126/76 (BP Location: Left Arm, Cuff Size: Large)   Pulse 81   Temp 97.9 F (36.6 C) (Oral)   Ht '5\' 9"'$  (1.753 m)   Wt 292 lb (132.5 kg)   SpO2 97%   BMI 43.12 kg/m  Physical Exam Constitutional:      Appearance: Normal appearance. He is obese. He is not ill-appearing.  HENT:     Head: Normocephalic and atraumatic.     Mouth/Throat:     Mouth: Mucous membranes are moist.     Pharynx: Oropharynx is clear.  Cardiovascular:     Rate and Rhythm: Normal rate and regular rhythm.  Pulmonary:     Effort: Pulmonary effort is normal.     Breath sounds: Normal breath sounds. No wheezing, rhonchi or rales.  Musculoskeletal:        General: Normal range of motion.     Cervical back: Normal range of motion and neck supple.  Skin:    General: Skin is warm and dry.  Neurological:     General: No focal deficit present.     Mental Status: He is alert and oriented to person, place, and time. Mental status is at baseline.  Psychiatric:        Mood and Affect: Mood normal.        Behavior: Behavior normal.        Thought Content: Thought content normal.        Judgment: Judgment normal.     Lab Results:  CBC No results found for: WBC, RBC, HGB, HCT, PLT, MCV, MCH, MCHC, RDW, LYMPHSABS, MONOABS, EOSABS, BASOSABS  BMET    Component Value Date/Time   NA 136 04/09/2022 0736   K 4.4 04/09/2022 0736   CL 100 04/09/2022 0736   CO2 27 04/09/2022 0736   GLUCOSE 219 (H) 04/09/2022 0736   BUN 13 04/09/2022 0736   CREATININE 0.76 04/09/2022 0736   CALCIUM 8.6 04/09/2022 0736   GFRNONAA 95 02/13/2009 0935   GFRAA 114 02/13/2009 0935    BNP No results found for: BNP  ProBNP No results found for: PROBNP  Imaging: No results found.   Assessment & Plan:   Sleep apnea - Home sleep study on 01/16/2015 showed severe obstructive sleep apnea, AHI 59.5/hr O2 low 73%. He is not consistently wearing CPAP, he had  a break in therapy in 2020. He never received replacement CPAP supplies. Mask is not fitting correctly. No SD card for compliance check. He has lost 35 lbs since last sleep study. Needs repeat home sleep study to re-assess degree of OSA. He is potentially interested in oral appliance if he qualifies. If OSA remains severe he will need order for new CPAP supplies, mask fitting and SD card. Advised he continue to work on weight loss effort. Advised against driving if excessively tired. FU in 8 weeks or sooner if needed.  Martyn Ehrich, NP 05/07/2022

## 2022-05-07 NOTE — Patient Instructions (Addendum)
  We will repeat home sleep study to reassess degree of sleep apnea due to current weight loss and see if you are candidate for oral appliance versus CPAP  Orders: Home sleep study re: hx sleep apnea  Follow-up: 8 weeks with Eustaquio Maize NP

## 2022-05-07 NOTE — Assessment & Plan Note (Signed)
-   Home sleep study on 01/16/2015 showed severe obstructive sleep apnea, AHI 59.5/hr O2 low 73%. He is not consistently wearing CPAP, he had a break in therapy in 2020. He never received replacement CPAP supplies. Mask is not fitting correctly. No SD card for compliance check. He has lost 35 lbs since last sleep study. Needs repeat home sleep study to re-assess degree of OSA. He is potentially interested in oral appliance if he qualifies. If OSA remains severe he will need order for new CPAP supplies, mask fitting and SD card. Advised he continue to work on weight loss effort. Advised against driving if excessively tired. FU in 8 weeks or sooner if needed.

## 2022-05-14 NOTE — Progress Notes (Signed)
Reviewed and agree with assessment/plan.   Mayte Diers, MD  Pulmonary/Critical Care 05/14/2022, 12:49 PM Pager:  [phone]  

## 2022-05-21 ENCOUNTER — Telehealth: Payer: Self-pay

## 2022-05-21 MED ORDER — OZEMPIC (0.25 OR 0.5 MG/DOSE) 2 MG/1.5ML ~~LOC~~ SOPN: 0.2500 mg | PEN_INJECTOR | SUBCUTANEOUS | 1 refills | Status: DC

## 2022-05-21 NOTE — Telephone Encounter (Signed)
Erx sent

## 2022-05-21 NOTE — Telephone Encounter (Signed)
MEDICATION: Semaglutide,0.25 or 0.'5MG'$ /DOS, (OZEMPIC, 0.25 OR 0.5 MG/DOSE,) 2 MG/1.5ML SOPN  PHARMACY: [place], [place] [address]  Comments: Patient is calling in wanting to update Dr.Duncan that Ozempic has been working very well for him. Last shot was today.   **Let patient know to contact pharmacy at the end of the day to make sure medication is ready. **  ** Please notify patient to allow 48-72 hours to process**  **Encourage patient to contact the pharmacy for refills or they can request refills through [facility]**

## 2022-05-27 NOTE — Telephone Encounter (Signed)
Patient states he is tolerating ozempic well. Does he need an increased dose? Please advise.

## 2022-05-28 MED ORDER — OZEMPIC (0.25 OR 0.5 MG/DOSE) 2 MG/1.5ML ~~LOC~~ SOPN: 0.5000 mg | PEN_INJECTOR | SUBCUTANEOUS | 1 refills | Status: DC

## 2022-05-28 MED ORDER — OZEMPIC (0.25 OR 0.5 MG/DOSE) 2 MG/1.5ML ~~LOC~~ SOPN
0.5000 mg | PEN_INJECTOR | SUBCUTANEOUS | 2 refills | Status: DC
Start: 2022-05-28 — End: 2022-07-15

## 2022-05-28 NOTE — Addendum Note (Signed)
Addended by: Sherrilee Gilles B on: 05/28/2022 11:22 AM   Modules accepted: Orders

## 2022-05-28 NOTE — Addendum Note (Signed)
Addended by: Tonia Ghent on: 05/28/2022 11:19 AM   Modules accepted: Orders

## 2022-05-28 NOTE — Telephone Encounter (Signed)
Patient has been notified to increase dose and patient requested new rx be sent in to reflect changes. New rx sent.

## 2022-05-28 NOTE — Telephone Encounter (Signed)
Would inc to 0.'5mg'$  weekly.  Let me know if not tolerated or if his sugar isn't improving.  Thanks.

## 2022-07-03 ENCOUNTER — Encounter: Payer: BC Managed Care – PPO | Admitting: Primary Care

## 2022-07-03 NOTE — Progress Notes (Deleted)
$'@Patient'P$  ID: Brett Stanley, male    DOB: 1956/03/10, 67 y.o.   MRN: [id number]  No chief complaint on file.   Referring provider: Tonia Ghent, MD  HPI:  66 year old male, never smoked.  Past medical history significant for apnea, allergic rhinitis, GERD, type 2 diabetes, obesity.     05/07/2022 Patient presents today for sleep consult. He is a former patient of Dr. Halford Chessman, last seen for consult on 10/12/2014 for snoring.  Home sleep study on 01/16/2015 showed severe obstructive sleep apnea, AHI 59.5/hr O2 low 73%.  Order was placed for CPAP back in February 2016 however patient did not follow-up with our office after this. He stopped wearing his CPAP during covid.  He never received new CPAP supplies. No SD card in machine. His mask is not fitting correctly. He has lost 35 lbs in the last year, wife thinks he is sleeping better. He in interested in oral appliance.   Sleep questionnaire Symptoms- Snoring Prior sleep study- Feb 2016 Bedtime- 9:30-10:30pm Time to fall asleep- not long  Nocturnal awakenings- 4-5 times Out of bed/start of day- 6am Weight changes- down 35 lbs Do you operate heavy machinery- No Do you currently wear CPAP- Yes Do you current wear oxygen- No Epworth- 2      Allergies  Allergen Reactions   Atorvastatin Other (See Comments)    Joint aches   Erythromycin Nausea Only   Metformin And Related Other (See Comments)    Zevin tolerated dose is '500mg'$  a day.    Oxycodone Other (See Comments)    Nausea but he can tolerate hydrocodone    Immunization History  Administered Date(s) Administered   Influenza Whole 09/14/2004, 09/24/2007, 09/01/2009, 09/23/2010   Influenza,inj,Quad PF,6+ Mos 10/08/2015, 08/24/2018, 09/01/2019, 10/10/2020, 09/02/2021   Influenza-Unspecified 09/25/2014, 09/14/2016, 09/14/2017   PFIZER(Purple Top)SARS-COV-2 Vaccination 02/18/2020, 03/10/2020, 10/10/2020, 10/15/2020   Pneumococcal Polysaccharide-23 11/14/2013   Td 02/18/2007    Tdap 04/07/2017   Zoster Recombinat (Shingrix) 01/21/2020, 06/09/2020    Past Medical History:  Diagnosis Date   Allergy    GERD (gastroesophageal reflux disease)    HTN (hypertension)    Hyperlipidemia    Sleep apnea    uses CPAP irregularly   Tubular adenoma of colon 2010   Type II or unspecified type diabetes mellitus without mention of complication, not stated as uncontrolled     Tobacco History: Social History   Tobacco Use  Smoking Status Never  Smokeless Tobacco Former   Counseling given: Not Answered   Outpatient Medications Prior to Visit  Medication Sig Dispense Refill   fexofenadine (ALLEGRA) 180 MG tablet Take 180 mg by mouth daily.     fluticasone (FLONASE) 50 MCG/ACT nasal spray Place into both nostrils daily.     glipiZIDE (GLUCOTROL) 5 MG tablet TAKE 2 TABLETS BY MOUTH DAILY BEFORE BREAKFAST 180 tablet 3   glucose blood (ONETOUCH VERIO) test strip Check sugar 1 time daily. E11.9 100 each 3   Ibuprofen 200 MG CAPS Take by mouth as needed.      lisinopril (ZESTRIL) 5 MG tablet TAKE 1 TABLET BY MOUTH DAILY 90 tablet 3   metFORMIN (GLUCOPHAGE) 500 MG tablet TAKE 1 TABLET BY MOUTH DAILY WITH BREAKFAST 90 tablet 3   Semaglutide,0.25 or 0.'5MG'$ /DOS, (OZEMPIC, 0.25 OR 0.5 MG/DOSE,) 2 MG/1.5ML SOPN Inject 0.5 mg into the skin once a week. 1.5 mL 2   sildenafil (REVATIO) 20 MG tablet TAKE 5 TABLETS BY MOUTH DAILY AS NEEDED 50 tablet 12   No facility-administered  medications prior to visit.      Review of Systems  Review of Systems   Physical Exam  There were no vitals taken for this visit. Physical Exam   Lab Results:  CBC No results found for: "WBC", "RBC", "HGB", "HCT", "PLT", "MCV", "MCH", "MCHC", "RDW", "LYMPHSABS", "MONOABS", "EOSABS", "BASOSABS"  BMET    Component Value Date/Time   NA 136 04/09/2022 0736   K 4.4 04/09/2022 0736   CL 100 04/09/2022 0736   CO2 27 04/09/2022 0736   GLUCOSE 219 (H) 04/09/2022 0736   BUN 13 04/09/2022 0736    CREATININE 0.76 04/09/2022 0736   CALCIUM 8.6 04/09/2022 0736   GFRNONAA 95 02/13/2009 0935   GFRAA 114 02/13/2009 0935    BNP No results found for: "BNP"  ProBNP No results found for: "PROBNP"  Imaging: No results found.   Assessment & Plan:   No problem-specific Assessment & Plan notes found for this encounter.     Martyn Ehrich, NP 07/03/2022

## 2022-07-07 ENCOUNTER — Ambulatory Visit: Payer: BC Managed Care – PPO

## 2022-07-07 DIAGNOSIS — G4733 Obstructive sleep apnea (adult) (pediatric): Secondary | ICD-10-CM | POA: Diagnosis not present

## 2022-07-07 DIAGNOSIS — Z8669 Personal history of other diseases of the nervous system and sense organs: Secondary | ICD-10-CM

## 2022-07-15 ENCOUNTER — Encounter: Payer: Self-pay | Admitting: Family Medicine

## 2022-07-15 ENCOUNTER — Ambulatory Visit: Payer: BC Managed Care – PPO | Admitting: Family Medicine

## 2022-07-15 VITALS — BP 122/80 | HR 75 | Temp 97.5°F | Ht 69.0 in | Wt 286.0 lb

## 2022-07-15 DIAGNOSIS — E669 Obesity, unspecified: Secondary | ICD-10-CM

## 2022-07-15 DIAGNOSIS — E1169 Type 2 diabetes mellitus with other specified complication: Secondary | ICD-10-CM | POA: Diagnosis not present

## 2022-07-15 DIAGNOSIS — L237 Allergic contact dermatitis due to plants, except food: Secondary | ICD-10-CM | POA: Diagnosis not present

## 2022-07-15 LAB — POCT GLYCOSYLATED HEMOGLOBIN (HGB A1C): Hemoglobin A1C: 7.8 % — AB (ref 4.0–5.6)

## 2022-07-15 MED ORDER — TRIAMCINOLONE ACETONIDE 0.5 % EX CREA
1.0000 | TOPICAL_CREAM | Freq: Two times a day (BID) | CUTANEOUS | 0 refills | Status: AC | PRN
Start: 2022-07-15 — End: ?

## 2022-07-15 MED ORDER — OZEMPIC (0.25 OR 0.5 MG/DOSE) 2 MG/1.5ML ~~LOC~~ SOPN
0.5000 mg | PEN_INJECTOR | SUBCUTANEOUS | 3 refills | Status: DC
Start: 2022-07-15 — End: 2022-09-23

## 2022-07-15 NOTE — Patient Instructions (Signed)
Use triamcinolone on the rash if needed.  Take care.  Glad to see you. Thanks for your effort.   Recheck in about 3 months.   Update me as needed.

## 2022-07-15 NOTE — Progress Notes (Unsigned)
Diabetes:  Using medications without difficulties: yes Hypoglycemic episodes: no Hyperglycemic episodes:no Feet problems: yes Blood Sugars averaging: 130-150 eye exam within last year: pending for 08/01/22.   A1c 7.8, improved.   He is cutting back on portions and some of that could be a med effect.  He is down 8 lbs.   He is taking glipizide '10mg'$  a day, '500mg'$  metformin, and 0.'5mg'$  ozempic.  No abd pain.  Rare diarrhea, if eating cereal (unclear if from the cereal or milk, d/w pt).  That is longstanding but he didn't attribute it to the metformin.  He thought he could tolerate 1 tab of metformin.    He has been working in the yard with poison ivy exposure.  Rash on the neck.   Meds, vitals, and allergies reviewed.   ROS: Per HPI unless specifically indicated in ROS section   GEN: nad, alert and oriented HEENT: ncat NECK: supple w/o LA CV: rrr. PULM: ctab, no inc wob ABD: soft, +bs EXT: no edema SKIN: rash on the R side of the neck from poison ivy exposure.    Diabetic foot exam: Normal inspection No skin breakdown No calluses  Normal DP pulses Normal sensation to light touch and monofilament Nails normal

## 2022-07-16 DIAGNOSIS — L237 Allergic contact dermatitis due to plants, except food: Secondary | ICD-10-CM | POA: Insufficient documentation

## 2022-07-16 NOTE — Assessment & Plan Note (Signed)
Use triamcinolone cream with routine cautions and update me as needed.

## 2022-07-16 NOTE — Assessment & Plan Note (Signed)
A1c is improved.  Continue glipizide metformin and Ozempic as is.  See above.  He will update me as needed.  Recheck in a few months.  Continue work on diet and exercise.

## 2022-07-21 ENCOUNTER — Telehealth: Payer: Self-pay

## 2022-07-21 NOTE — Telephone Encounter (Signed)
Prior auth for Ozempic (0.25 or 0.5 MG/DOSE) '2MG'$ /3ML pen-injectors - Cover My Meds states this PA has been resolved, no additional PA is required.  I spoke to [place], who stated that this Rx can not be filled again until 07/30/22 at the earliest.  I will keep this info to try to start a new PA on 07/30/22.

## 2022-07-21 NOTE — Telephone Encounter (Signed)
Prior auth started for Ozempic (0.25 or 0.5 MG/DOSE) '2MG'$ /3ML pen-injectors. Brett Stanley (Key: [id number]) Rx #: [id number] Clinical questions are not available to answer yet.  Will check back and answer when available.

## 2022-07-27 ENCOUNTER — Telehealth: Payer: Self-pay | Admitting: Pulmonary Disease

## 2022-07-27 DIAGNOSIS — G4733 Obstructive sleep apnea (adult) (pediatric): Secondary | ICD-10-CM

## 2022-07-27 NOTE — Telephone Encounter (Signed)
Call patient  Sleep study result  Date of study: 07/07/2022  Impression: Mild obstructive sleep apnea Mild oxygen desaturations  Recommendation:   Patient already on CPAP therapy, may continue CPAP therapy Auto CPAP 5-15 should be adequate for treatment  Other options of treatment for mild obstructive sleep apnea will include  1. Watchful waiting with emphasis on weight loss measures, sleep position modification to optimize lateral sleep, elevating the head of the bed by about 30 degrees may also help.  2.  An oral device may be fashioned for the treatment of mild sleep disordered breathing, will involve referral to dentist.  Other options of treatment may be considered if patient not tolerating CPAP well  Follow-up as previously scheduled

## 2022-07-28 NOTE — Telephone Encounter (Signed)
Called and spoke with pt letting him know results of HST per BW and recs and pt verbalized understanding. Referral to orthodontics has been placed. Nothing further needed.

## 2022-07-28 NOTE — Telephone Encounter (Signed)
Please let patient know home sleep study showed mild OSA, he could very well be candidate for oral appliance and may be able to stop using CPAP. If he would like referral to Dr. Augustina Mood we can place order

## 2022-07-31 ENCOUNTER — Telehealth: Payer: Self-pay

## 2022-07-31 NOTE — Telephone Encounter (Signed)
Prior auth started for Ozempic (0.25 or 0.5 MG/DOSE) '2MG'$ /3ML pen-injectors. Bensyn Clemenson (Key: [id number]) Waiting for determination.

## 2022-08-01 LAB — HM DIABETES EYE EXAM

## 2022-08-04 NOTE — Telephone Encounter (Signed)
Received fax from [facility] that no PA is required at this time. The member's last fill date was 07/19/22 and the next available fill date is 08/09/22.

## 2022-08-13 NOTE — Telephone Encounter (Signed)
New prior auth started for Ozempic (0.25 or 0.5 MG/DOSE) '2MG'$ /3ML pen-injectors. Terion Daum (Key: [id number]) Waiting for clinical questions.

## 2022-08-15 NOTE — Telephone Encounter (Signed)
Status of PA on Cover My Meds states resolved.  I called [facility] to check status of Rx.  They stated that  insurance paid for the prescription and the patient picked it up.

## 2022-09-01 ENCOUNTER — Telehealth: Payer: Self-pay | Admitting: Pulmonary Disease

## 2022-09-01 NOTE — Telephone Encounter (Signed)
Was seen by Dr. Toy Cookey  Patient is considering an oral device, he continues to review his options

## 2022-09-23 ENCOUNTER — Telehealth: Payer: Self-pay | Admitting: Family Medicine

## 2022-09-23 MED ORDER — OZEMPIC (0.25 OR 0.5 MG/DOSE) 2 MG/1.5ML ~~LOC~~ SOPN: 0.5000 mg | PEN_INJECTOR | SUBCUTANEOUS | 3 refills | Status: DC

## 2022-09-23 NOTE — Telephone Encounter (Signed)
Charlie from [employer] called in and stated that [place] insurance now requires an ICD-10 code to refill Semaglutide,0.25 or 0.'5MG'$ /DOS, (OZEMPIC, 0.25 OR 0.5 MG/DOSE,) 2 MG/1.5ML SOPN. She stated they need a new prescription sent over with that on it in order to get it refilled. Thank you!

## 2022-09-23 NOTE — Telephone Encounter (Signed)
Pt called asking for update on medication refill. Call back # [phone]

## 2022-09-23 NOTE — Addendum Note (Signed)
Addended by: Sherrilee Gilles B on: 09/23/2022 03:43 PM   Modules accepted: Orders

## 2022-09-23 NOTE — Telephone Encounter (Signed)
Erx sent and patient has been notified.

## 2022-09-24 ENCOUNTER — Telehealth: Payer: Self-pay

## 2022-09-24 NOTE — Telephone Encounter (Signed)
Prior auth started and approved for Ozempic (0.25 or 0.5 MG/DOSE) '2MG'$ /1.5ML pen-injectors. BEREL NAJJAR (Key: [id number]) Rx #: [id number] Status: Approved, Coverage Starts on: 12/15/2021 12:00:00 AM, Coverage Ends on: 12/15/2023   Patient notified via mychart.

## 2022-10-20 ENCOUNTER — Encounter: Payer: Self-pay | Admitting: Family Medicine

## 2022-10-20 ENCOUNTER — Telehealth: Payer: Self-pay

## 2022-10-20 ENCOUNTER — Ambulatory Visit: Payer: Medicare PPO | Admitting: Family Medicine

## 2022-10-20 VITALS — BP 136/76 | HR 78 | Temp 97.2°F | Ht 69.0 in | Wt 283.0 lb

## 2022-10-20 DIAGNOSIS — G4733 Obstructive sleep apnea (adult) (pediatric): Secondary | ICD-10-CM | POA: Diagnosis not present

## 2022-10-20 DIAGNOSIS — E1169 Type 2 diabetes mellitus with other specified complication: Secondary | ICD-10-CM

## 2022-10-20 DIAGNOSIS — Z23 Encounter for immunization: Secondary | ICD-10-CM | POA: Diagnosis not present

## 2022-10-20 DIAGNOSIS — E669 Obesity, unspecified: Secondary | ICD-10-CM | POA: Diagnosis not present

## 2022-10-20 LAB — COMPREHENSIVE METABOLIC PANEL
ALT: 14 U/L (ref 0–53)
AST: 15 U/L (ref 0–37)
Albumin: 4.1 g/dL (ref 3.5–5.2)
Alkaline Phosphatase: 50 U/L (ref 39–117)
BUN: 12 mg/dL (ref 6–23)
CO2: 23 mEq/L (ref 19–32)
Calcium: 8.8 mg/dL (ref 8.4–10.5)
Chloride: 101 mEq/L (ref 96–112)
Creatinine, Ser: 0.76 mg/dL (ref 0.40–1.50)
GFR: 94.6 mL/min (ref >60.00)
Glucose, Bld: 134 mg/dL — ABNORMAL HIGH (ref 70–99)
Potassium: 4.3 mEq/L (ref 3.5–5.1)
Sodium: 135 mEq/L (ref 135–145)
Total Bilirubin: 0.7 mg/dL (ref 0.2–1.2)
Total Protein: 6.9 g/dL (ref 6.0–8.3)

## 2022-10-20 LAB — HEMOGLOBIN A1C: Hgb A1c MFr Bld: 8.2 % — ABNORMAL HIGH (ref 4.6–6.5)

## 2022-10-20 MED ORDER — ONETOUCH VERIO VI STRP: ORAL_STRIP | 3 refills | Status: AC

## 2022-10-20 MED ORDER — OZEMPIC (0.25 OR 0.5 MG/DOSE) 2 MG/1.5ML ~~LOC~~ SOPN
0.5000 mg | PEN_INJECTOR | SUBCUTANEOUS | 3 refills | Status: DC
Start: 2022-10-20 — End: 2023-10-14

## 2022-10-20 NOTE — Telephone Encounter (Signed)
Prior auth started for [employer] strips. Sherlon Handing Key: [id number] - PA Case ID: [id number] - Rx #: [id number] Waiting for determination.

## 2022-10-20 NOTE — Progress Notes (Unsigned)
Diabetes:  Using medications without difficulties: yes Hypoglycemic episodes:no Hyperglycemic episodes:no Feet problems: rare tingling in the feet, not daily.   Blood Sugars averaging: usually ~140-150 eye exam within last year:  yes He is trying to adjust to retirement.   Labs pending.  Weight is down in the meantime. Appetite affected by ozempic but this is tolerable.  He is working on diet.    He had OSA eval.  D/w pt about getting a mouthpiece- he had denial letter at the [facility].  He has seen pulmonary previously and I am going to check with the pulmonary office for input.  Meds, vitals, and allergies reviewed.  ROS: Per HPI unless specifically indicated in ROS section   GEN: nad, alert and oriented HEENT: ncat NECK: supple w/o LA CV: rrr. PULM: ctab, no inc wob ABD: soft, +bs EXT: no edema SKIN: no acute rash  Diabetic foot exam: Normal inspection No skin breakdown No calluses  Normal DP pulses Normal sensation to light touch and monofilament Nails normal

## 2022-10-20 NOTE — Patient Instructions (Signed)
Go to the lab on the way out.   If you have mychart we'll likely use that to update you.    Take care.  Glad to see you. Plan on recheck in about 3-4 months.  A1c at the visit.

## 2022-10-21 NOTE — Telephone Encounter (Signed)
Prior auth for [employer] has been approved. Sherlon Handing Key: [id number] - PA Case ID: [id number] - Rx #: [id number] ONETOUCH VERIO TEST STRIP has been approved for the following dates:  12/15/2021 to 12/15/2023. Authorization Number:  [id number]  Approval letter sent to scanning.  Patient notified via mychart.

## 2022-10-22 ENCOUNTER — Telehealth: Payer: Self-pay | Admitting: Family Medicine

## 2022-10-22 NOTE — Telephone Encounter (Signed)
Jessica-please talk to me about this patient tomorrow.  I have paperwork about his denial of coverage for an oral device for sleep apnea.  I need a copy of that sent over to pulmonary for input.  Thanks.

## 2022-10-22 NOTE — Assessment & Plan Note (Signed)
See notes on labs. Plan on recheck in about 3-4 months.  A1c at the visit.  Continue Ozempic.  Continue glimepiride and metformin.  Continue work on diet and exercise.

## 2022-10-22 NOTE — Assessment & Plan Note (Signed)
He had OSA eval.  D/w pt about getting a mouthpiece- he had denial letter at the [facility].  He has seen pulmonary previously and I am going to check with the pulmonary office for input.

## 2022-10-23 NOTE — Telephone Encounter (Signed)
PPW has been faxed to Dr. Ander Slade for input.

## 2023-01-23 ENCOUNTER — Telehealth: Payer: Self-pay | Admitting: Family Medicine

## 2023-01-23 MED ORDER — SILDENAFIL CITRATE 20 MG PO TABS: ORAL_TABLET | ORAL | 2 refills | Status: DC

## 2023-01-23 NOTE — Telephone Encounter (Signed)
Erx sent

## 2023-01-23 NOTE — Telephone Encounter (Signed)
Prescription Request  01/23/2023  Is this a "Controlled Substance" medicine? No  LOV: 10/20/2022  What is the name of the medication or equipment? sildenafil (REVATIO) 20 MG tablet   Have you contacted your pharmacy to request a refill? Yes   Which pharmacy would you like this sent to?  [facility] - [place], [place] [address] Phone: [phone] Fax: [phone]    Patient notified that their request is being sent to the clinical staff for review and that they should receive a response within 2 business days.   Please advise at Mobile [phone] (mobile)

## 2023-02-19 ENCOUNTER — Ambulatory Visit: Payer: Medicare PPO | Admitting: Family Medicine

## 2023-02-19 ENCOUNTER — Encounter: Payer: Self-pay | Admitting: Family Medicine

## 2023-02-19 VITALS — BP 120/78 | HR 78 | Temp 97.6°F | Ht 69.0 in | Wt 282.0 lb

## 2023-02-19 DIAGNOSIS — E669 Obesity, unspecified: Secondary | ICD-10-CM

## 2023-02-19 DIAGNOSIS — E1169 Type 2 diabetes mellitus with other specified complication: Secondary | ICD-10-CM

## 2023-02-19 DIAGNOSIS — Z832 Family history of diseases of the blood and blood-forming organs and certain disorders involving the immune mechanism: Secondary | ICD-10-CM

## 2023-02-19 LAB — POCT GLYCOSYLATED HEMOGLOBIN (HGB A1C): Hemoglobin A1C: 7.6 % — AB (ref 4.0–5.6)

## 2023-02-19 NOTE — Patient Instructions (Addendum)
Recheck in about 3-4 months at a yearly visit.   Take care.  Glad to see you. Update me as needed.

## 2023-02-19 NOTE — Progress Notes (Signed)
Diabetes:  Using medications without difficulties: yes Hypoglycemic episodes:no Hyperglycemic episodes:no Feet problems:no Blood Sugars averaging: usually ~ 130s-140s eye exam within last year: yes A1c d/w pt at [place].  7.6. Diet and exercise d/w pt.   No abd pain.    His sister tested positive for V Leiden.  He has no h/o thrombosis.  D/w pt about checking his lab at next visit.  See orders.    Still with knee pain, he is putting up with that.  D/w pt.    SK on the trunk, d/w pt about derm follow up.    Meds, vitals, and allergies reviewed.   ROS: Per HPI unless specifically indicated in ROS section   GEN: nad, alert and oriented HEENT: ncat NECK: supple w/o LA CV: rrr. PULM: ctab, no inc wob ABD: soft, +bs EXT: no edema SKIN: well perfused. Benign appearing SKs notes on the trunk, one on the L chest wall.   Diabetic foot exam: Normal inspection No skin breakdown No calluses  Normal DP pulses Normal sensation to light touch and monofilament Nails normal   Current Outpatient Medications on File Prior to Visit  Medication Sig Dispense Refill   fexofenadine (ALLEGRA) 180 MG tablet Take 180 mg by mouth daily.     fluticasone (FLONASE) 50 MCG/ACT nasal spray Place into both nostrils daily.     glipiZIDE (GLUCOTROL) 5 MG tablet TAKE 2 TABLETS BY MOUTH DAILY BEFORE BREAKFAST 180 tablet 3   glucose blood (ONETOUCH VERIO) test strip Check sugar 1 time daily. E11.9 100 each 3   Ibuprofen 200 MG CAPS Take by mouth as needed.      lisinopril (ZESTRIL) 5 MG tablet TAKE 1 TABLET BY MOUTH DAILY 90 tablet 3   metFORMIN (GLUCOPHAGE) 500 MG tablet TAKE 1 TABLET BY MOUTH DAILY WITH BREAKFAST 90 tablet 3   Semaglutide,0.25 or 0.'5MG'$ /DOS, (OZEMPIC, 0.25 OR 0.5 MG/DOSE,) 2 MG/1.5ML SOPN Inject 0.5 mg into the skin once a week. Dx code: E11.69 4.5 mL 3   sildenafil (REVATIO) 20 MG tablet TAKE 5 TABLETS BY MOUTH DAILY AS NEEDED 50 tablet 2   triamcinolone cream (KENALOG) 0.5 % Apply 1  Application topically 2 (two) times daily as needed. 454 g 0   No current facility-administered medications on file prior to visit.

## 2023-02-19 NOTE — Assessment & Plan Note (Signed)
No change in meds.  See orders.  No ADE on med.  Dec appetite with ozempic use.  A1c improved.  Continue as is.  Recheck in about 3-4 months.  Continue metformin ozempic and glipizide.

## 2023-02-22 ENCOUNTER — Other Ambulatory Visit: Payer: Self-pay | Admitting: Family Medicine

## 2023-02-22 DIAGNOSIS — Z832 Family history of diseases of the blood and blood-forming organs and certain disorders involving the immune mechanism: Secondary | ICD-10-CM

## 2023-03-04 ENCOUNTER — Encounter: Payer: Self-pay | Admitting: Nurse Practitioner

## 2023-03-04 ENCOUNTER — Ambulatory Visit (INDEPENDENT_AMBULATORY_CARE_PROVIDER_SITE_OTHER): Payer: Medicare PPO | Admitting: Nurse Practitioner

## 2023-03-04 VITALS — BP 128/76 | HR 83 | Temp 97.6°F | Resp 16 | Ht 69.0 in | Wt 283.0 lb

## 2023-03-04 DIAGNOSIS — S30860A Insect bite (nonvenomous) of lower back and pelvis, initial encounter: Secondary | ICD-10-CM | POA: Diagnosis not present

## 2023-03-04 DIAGNOSIS — W57XXXA Bitten or stung by nonvenomous insect and other nonvenomous arthropods, initial encounter: Secondary | ICD-10-CM

## 2023-03-04 DIAGNOSIS — L03319 Cellulitis of trunk, unspecified: Secondary | ICD-10-CM | POA: Diagnosis not present

## 2023-03-04 MED ORDER — DOXYCYCLINE HYCLATE 100 MG PO TABS: 100.0000 mg | ORAL_TABLET | Freq: Two times a day (BID) | ORAL | 0 refills | Status: AC

## 2023-03-04 NOTE — Assessment & Plan Note (Signed)
Given tick bite will elect to treat with doxycycline 100 mg twice daily for 7 days.  No bull's-eye lesion or numbness and tingling or rash outside of area of concern

## 2023-03-04 NOTE — Assessment & Plan Note (Signed)
Will like to treat with doxycycline 100 mg twice daily for cellulitis secondary to a tick bite.  Did review signs and symptoms when to be seen again.  Follow-up if no improvement

## 2023-03-04 NOTE — Patient Instructions (Signed)
Nice to see you today I have sent in an antibiotic to the pharmacy Follow up if you do not start improving over the next 2-3 days or it get worse

## 2023-03-04 NOTE — Progress Notes (Signed)
Acute Office Visit  Subjective:     Patient ID: Brett Stanley, male    DOB: 1956-04-22, 67 y.o.   MRN: [place]:[id number]  Chief Complaint  Patient presents with   Tick Removal    Noticed it Monday on left hip. Last night they seen that it was swollen.     HPI Patient is in today for skin issue on left hip with a history of sleep apnea, GERD, DM2 HLD.  States that he has been trimming trees and bushes. States that he noticed it was itching and got his wife to check. States that his wife checked on Monday and saw a tick. Per wife she was able to remove the tick in its entirety   States that it is swelling and increased in size over the past 2 days . States no discharge. He has tried triamcinolone cream with out good relief. This was for the itching    Review of Systems  Constitutional:  Negative for chills and fever.  Respiratory:  Negative for shortness of breath.   Cardiovascular:  Negative for chest pain.  Gastrointestinal:  Negative for abdominal pain, constipation, diarrhea, nausea and vomiting.  Skin:        "+" lesion   Neurological:  Positive for headaches. Negative for tingling.        Objective:    BP 128/76   Pulse 83   Temp 97.6 F (36.4 C)   Resp 16   Ht 5\' 9"  (1.753 m)   Wt 283 lb (128.4 kg)   SpO2 98%   BMI 41.79 kg/m    Physical Exam Vitals and nursing note reviewed.  Constitutional:      Appearance: Normal appearance.  Cardiovascular:     Rate and Rhythm: Normal rate and regular rhythm.     Heart sounds: Normal heart sounds.  Pulmonary:     Effort: Pulmonary effort is normal.     Breath sounds: Normal breath sounds.  Abdominal:     General: Bowel sounds are normal.  Skin:    Findings: Erythema and lesion present.       Neurological:     Mental Status: He is alert.     No results found for any visits on 03/04/23.      Assessment & Plan:   Problem List Items Addressed This Visit       Musculoskeletal and Integument   Tick bite -  Primary    Given tick bite will elect to treat with doxycycline 100 mg twice daily for 7 days.  No bull's-eye lesion or numbness and tingling or rash outside of area of concern      Relevant Medications   doxycycline (VIBRA-TABS) 100 MG tablet     Other   Cellulitis of trunk    Will like to treat with doxycycline 100 mg twice daily for cellulitis secondary to a tick bite.  Did review signs and symptoms when to be seen again.  Follow-up if no improvement      Relevant Medications   doxycycline (VIBRA-TABS) 100 MG tablet    Meds ordered this encounter  Medications   doxycycline (VIBRA-TABS) 100 MG tablet    Sig: Take 1 tablet (100 mg total) by mouth 2 (two) times daily for 7 days.    Dispense:  14 tablet    Refill:  0    Order Specific Question:   Supervising Provider    Answer:   TOWER, MARNE A [[id number]]    Return if symptoms worsen  or fail to improve.  Romilda Garret, NP

## 2023-03-09 ENCOUNTER — Telehealth: Payer: Self-pay | Admitting: Family Medicine

## 2023-03-09 DIAGNOSIS — E1169 Type 2 diabetes mellitus with other specified complication: Secondary | ICD-10-CM

## 2023-03-09 MED ORDER — METFORMIN HCL 500 MG PO TABS: 500.0000 mg | ORAL_TABLET | Freq: Every day | ORAL | 3 refills | Status: DC

## 2023-03-09 MED ORDER — GLIPIZIDE 5 MG PO TABS: ORAL_TABLET | ORAL | 3 refills | Status: DC

## 2023-03-09 MED ORDER — LISINOPRIL 5 MG PO TABS: 5.0000 mg | ORAL_TABLET | Freq: Every day | ORAL | 3 refills | Status: DC

## 2023-03-09 NOTE — Telephone Encounter (Signed)
Prescription Request  03/09/2023  LOV: 02/19/2023  What is the name of the medication or equipment? lisinopril (ZESTRIL) 5 MG tablet   metFORMIN (GLUCOPHAGE) 500 MG tablet  glipiZIDE (GLUCOTROL) 5 MG tablet   Have you contacted your pharmacy to request a refill? Yes   Which pharmacy would you like this sent to?  [facility] - [place], [place] [address] Phone: [phone] Fax: [phone]    Patient notified that their request is being sent to the clinical staff for review and that they should receive a response within 2 business days.   Please advise at Mobile [phone] (mobile)

## 2023-03-09 NOTE — Telephone Encounter (Signed)
Erxs sent 

## 2023-03-22 ENCOUNTER — Other Ambulatory Visit: Payer: Self-pay | Admitting: Family Medicine

## 2023-03-22 DIAGNOSIS — E7800 Pure hypercholesterolemia, unspecified: Secondary | ICD-10-CM

## 2023-03-22 DIAGNOSIS — E119 Type 2 diabetes mellitus without complications: Secondary | ICD-10-CM

## 2023-03-22 DIAGNOSIS — Z125 Encounter for screening for malignant neoplasm of prostate: Secondary | ICD-10-CM

## 2023-03-26 ENCOUNTER — Telehealth: Payer: Self-pay | Admitting: Family Medicine

## 2023-03-26 NOTE — Telephone Encounter (Signed)
Type of forms received:Health Examination  Routed WL:KHVFMB Pool  Paperwork received by : Lesly Rubenstein   Individual made aware of 3-5 business day turn around (Y/N): Y  Form completed and patient made aware of charges(Y/N): Y   Faxed to :   Form location: Place in PCP [employer]

## 2023-03-26 NOTE — Telephone Encounter (Signed)
PPW in Dr. Lianne Bushy inbox

## 2023-03-27 NOTE — Telephone Encounter (Signed)
Patient called back in and stated that he needs a skin TB test. He is scheduled for Wednesday as nothing was available on Tuesday and he needs it in order to return to work. Please advise if this needs to be moved. Thank you!

## 2023-03-27 NOTE — Telephone Encounter (Signed)
He can go ahead and be scheduled for a TB test. We just need to know if he needs a skin TB test or blood test done. If skin TB test needs to be done please schedule on a Tuesday on [place] schedule and can be any day for lab appt for the blood test

## 2023-03-27 NOTE — Telephone Encounter (Signed)
Patient called in regarding ppw dropped off yesterday, states he will need a TB test to be cleared to go back to work. Was wondering if/ when we could get patient scheduled for this. Please advise, thank you.

## 2023-03-30 NOTE — Telephone Encounter (Signed)
Patient is scheduled for TB test on 4/17

## 2023-03-30 NOTE — Telephone Encounter (Signed)
Form needs TB test result.  Then I can sign and date it.  Thanks.

## 2023-04-01 ENCOUNTER — Ambulatory Visit (INDEPENDENT_AMBULATORY_CARE_PROVIDER_SITE_OTHER): Payer: Medicare PPO

## 2023-04-01 DIAGNOSIS — Z111 Encounter for screening for respiratory tuberculosis: Secondary | ICD-10-CM

## 2023-04-01 NOTE — Progress Notes (Signed)
Per orders of Dr. Crawford Givens is out of office and Dr Eustaquio Boyden is in office, placement of PPD in left arm given by Lewanda Rife. Patient tolerated injection well.   PPD Placement note Brett Stanley, 67 y.o. male is here today for placement of PPD test Reason for PPD test: work form Pt taken PPD test before: yes Verified in allergy area and with patient that they are not allergic to the products PPD is made of (Phenol or Tween). Yes Is patient taking any oral or IV steroid medication now or have they taken it in the last month? no Has the patient ever received the BCG vaccine?: no Has the patient been in recent contact with anyone known or suspected of having active TB disease?: no      Date of exposure (if applicable): n/a      Name of person they were exposed to (if applicable): n/a Patient's Country of origin?: [place] States O: Alert and oriented in NAD. P:  PPD placed on 04/01/2023.  Patient advised to return for reading within 48-72 hours.

## 2023-04-03 ENCOUNTER — Ambulatory Visit (INDEPENDENT_AMBULATORY_CARE_PROVIDER_SITE_OTHER): Payer: Medicare PPO

## 2023-04-03 DIAGNOSIS — Z111 Encounter for screening for respiratory tuberculosis: Secondary | ICD-10-CM

## 2023-04-03 LAB — TB SKIN TEST
Induration: 0 mm
TB Skin Test: NEGATIVE

## 2023-04-03 NOTE — Progress Notes (Signed)
PPD Reading Note  PPD read and results entered in EpicCare.  Result: 0 mm induration.  Interpretation: Negative  If test not read within 48-72 hours of initial placement, patient advised to repeat in other arm 1-3 weeks after this test.  Allergic reaction: no

## 2023-04-10 ENCOUNTER — Other Ambulatory Visit (INDEPENDENT_AMBULATORY_CARE_PROVIDER_SITE_OTHER): Payer: Medicare PPO

## 2023-04-10 DIAGNOSIS — Z125 Encounter for screening for malignant neoplasm of prostate: Secondary | ICD-10-CM | POA: Diagnosis not present

## 2023-04-10 DIAGNOSIS — E7800 Pure hypercholesterolemia, unspecified: Secondary | ICD-10-CM | POA: Diagnosis not present

## 2023-04-10 DIAGNOSIS — E119 Type 2 diabetes mellitus without complications: Secondary | ICD-10-CM | POA: Diagnosis not present

## 2023-04-10 DIAGNOSIS — Z832 Family history of diseases of the blood and blood-forming organs and certain disorders involving the immune mechanism: Secondary | ICD-10-CM

## 2023-04-10 LAB — COMPREHENSIVE METABOLIC PANEL
ALT: 13 U/L (ref 0–53)
AST: 15 U/L (ref 0–37)
Albumin: 3.9 g/dL (ref 3.5–5.2)
Alkaline Phosphatase: 45 U/L (ref 39–117)
BUN: 14 mg/dL (ref 6–23)
CO2: 25 mEq/L (ref 19–32)
Calcium: 9.2 mg/dL (ref 8.4–10.5)
Chloride: 102 mEq/L (ref 96–112)
Creatinine, Ser: 0.82 mg/dL (ref 0.40–1.50)
GFR: 92.15 mL/min (ref >60.00)
Glucose, Bld: 145 mg/dL — ABNORMAL HIGH (ref 70–99)
Potassium: 4.7 mEq/L (ref 3.5–5.1)
Sodium: 136 mEq/L (ref 135–145)
Total Bilirubin: 0.8 mg/dL (ref 0.2–1.2)
Total Protein: 6.8 g/dL (ref 6.0–8.3)

## 2023-04-10 LAB — MICROALBUMIN / CREATININE URINE RATIO
Creatinine,U: 157.4 mg/dL
Microalb Creat Ratio: 0.5 mg/g (ref 0.0–30.0)
Microalb, Ur: 0.8 mg/dL (ref 0.0–1.9)

## 2023-04-10 LAB — CBC WITH DIFFERENTIAL/PLATELET
Basophils Absolute: 0.1 10*3/uL (ref 0.0–0.1)
Basophils Relative: 0.8 % (ref 0.0–3.0)
Eosinophils Absolute: 0.1 10*3/uL (ref 0.0–0.7)
Eosinophils Relative: 1.7 % (ref 0.0–5.0)
HCT: 44 % (ref 39.0–52.0)
Hemoglobin: 14.8 g/dL (ref 13.0–17.0)
Lymphocytes Relative: 35.7 % (ref 12.0–46.0)
Lymphs Abs: 2.6 10*3/uL (ref 0.7–4.0)
MCHC: 33.6 g/dL (ref 30.0–36.0)
MCV: 87.6 fl (ref 78.0–100.0)
Monocytes Absolute: 0.7 10*3/uL (ref 0.1–1.0)
Monocytes Relative: 9.7 % (ref 3.0–12.0)
Neutro Abs: 3.8 10*3/uL (ref 1.4–7.7)
Neutrophils Relative %: 52.1 % (ref 43.0–77.0)
Platelets: 257 10*3/uL (ref 150.0–400.0)
RBC: 5.02 Mil/uL (ref 4.22–5.81)
RDW: 13.4 % (ref 11.5–15.5)
WBC: 7.2 10*3/uL (ref 4.0–10.5)

## 2023-04-10 LAB — LIPID PANEL
Cholesterol: 184 mg/dL (ref 0–200)
HDL: 52.9 mg/dL (ref >39.00)
LDL Cholesterol: 118 mg/dL — ABNORMAL HIGH (ref 0–99)
NonHDL: 131.17
Total CHOL/HDL Ratio: 3
Triglycerides: 65 mg/dL (ref 0.0–149.0)
VLDL: 13 mg/dL (ref 0.0–40.0)

## 2023-04-10 LAB — HEMOGLOBIN A1C: Hgb A1c MFr Bld: 7.6 % — ABNORMAL HIGH (ref 4.6–6.5)

## 2023-04-10 LAB — PSA: PSA: 3.75 ng/mL (ref 0.10–4.00)

## 2023-04-14 LAB — FACTOR 5 ASSAY: COAG FACTOR V ACTIVITY: 118 % normal (ref 65–150)

## 2023-04-17 ENCOUNTER — Encounter: Payer: Self-pay | Admitting: Family Medicine

## 2023-04-17 ENCOUNTER — Ambulatory Visit (INDEPENDENT_AMBULATORY_CARE_PROVIDER_SITE_OTHER): Payer: Medicare PPO | Admitting: Family Medicine

## 2023-04-17 VITALS — BP 120/70 | HR 76 | Temp 98.7°F | Ht 69.0 in | Wt 277.0 lb

## 2023-04-17 DIAGNOSIS — E1169 Type 2 diabetes mellitus with other specified complication: Secondary | ICD-10-CM

## 2023-04-17 DIAGNOSIS — Z0000 Encounter for general adult medical examination without abnormal findings: Secondary | ICD-10-CM

## 2023-04-17 DIAGNOSIS — E669 Obesity, unspecified: Secondary | ICD-10-CM | POA: Diagnosis not present

## 2023-04-17 DIAGNOSIS — Z23 Encounter for immunization: Secondary | ICD-10-CM | POA: Diagnosis not present

## 2023-04-17 DIAGNOSIS — G720 Drug-induced myopathy: Secondary | ICD-10-CM

## 2023-04-17 DIAGNOSIS — Z7189 Other specified counseling: Secondary | ICD-10-CM

## 2023-04-17 NOTE — Patient Instructions (Addendum)
Please call GI this summer if you don't hear from them. [phone]. Keep working on diet and exercise, walking at work, and recheck A1c at a visit in about 6 months.  You don't have to fast.   PNA vaccine today.  Take care.  Glad to see you.

## 2023-04-17 NOTE — Progress Notes (Unsigned)
I have personally reviewed the Medicare Annual Wellness questionnaire and have noted 1. The patient's medical and social history 2. Their use of alcohol, tobacco or illicit drugs 3. Their current medications and supplements 4. The patient's functional ability including ADL's, fall risks, home safety risks and hearing or visual             impairment. 5. Diet and physical activities 6. Evidence for depression or mood disorders  The patients weight, height, BMI have been recorded in the chart and visual acuity is per eye clinic.  I have made referrals, counseling and provided education to the patient based review of the above and I have provided the pt with a written personalized care plan for preventive services.  Provider list updated- see scanned forms.  Routine anticipatory guidance given to patient.  See health maintenance. The possibility exists that previously documented standard health maintenance information may have been brought forward from a previous encounter into this note.  If needed, that same information has been updated to reflect the current situation based on today's encounter.    Flu Shingles PNA Tetanus Colon 2021, d/w pt about 2024 f/u.   PSA 2024.   Advance directive Cognitive function addressed- see scanned forms- and if abnormal then additional documentation follows.   Hearing and vision screening done.  Baseline EKG in chart.    In addition to [facility], follow up visit for the below conditions:  Diabetes:  Using medications without difficulties: yes Hypoglycemic episodes:no Hyperglycemic episodes:no Feet problems:no Blood Sugars averaging: 130s this AM.  eye exam within last year: yes Statin intolerant, aches.    Factor 5 results d/w pt.    PMH and SH reviewed  Meds, vitals, and allergies reviewed.   ROS: Per HPI.  Unless specifically indicated otherwise in HPI, the patient denies:  General: fever. Eyes: acute vision changes ENT: sore  throat Cardiovascular: chest pain Respiratory: SOB GI: vomiting GU: dysuria Musculoskeletal: acute back pain Derm: acute rash Neuro: acute motor dysfunction Psych: worsening mood Endocrine: polydipsia Heme: bleeding Allergy: hayfever  GEN: nad, alert and oriented HEENT: mucous membranes moist NECK: supple w/o LA CV: rrr. PULM: ctab, no inc wob ABD: soft, +bs EXT: no edema SKIN: no acute rash  Diabetic foot exam: Normal inspection No skin breakdown No calluses  Normal DP pulses Normal sensation to light touch and monofilament Nails normal

## 2023-04-19 NOTE — Assessment & Plan Note (Signed)
Advance directive- wife designated if patient were incapacitated.  

## 2023-04-19 NOTE — Assessment & Plan Note (Signed)
Statin intolerant, aches.

## 2023-04-19 NOTE — Assessment & Plan Note (Signed)
  Flu previously done Shingles previously done PNA-20 2024, discussed with patient and done at office visit. Tetanus 2018 Colon 2021, d/w pt about 2024 f/u.   PSA 2024.   Advance directive-wife designated if patient were incapacitated. Cognitive function addressed- see scanned forms- and if abnormal then additional documentation follows.   Hearing and vision screening done.  Baseline EKG in chart.

## 2023-04-19 NOTE — Assessment & Plan Note (Signed)
He is back at work, walking more.  A1c stable at 7.6.  Continue metformin lisinopril glipizide and Ozempic.  Recheck A1c at office visit in about 6 months.  Update me as needed in the meantime.

## 2023-05-13 ENCOUNTER — Encounter: Payer: Self-pay | Admitting: Gastroenterology

## 2023-06-16 DIAGNOSIS — R208 Other disturbances of skin sensation: Secondary | ICD-10-CM | POA: Diagnosis not present

## 2023-06-16 DIAGNOSIS — L820 Inflamed seborrheic keratosis: Secondary | ICD-10-CM | POA: Diagnosis not present

## 2023-06-16 DIAGNOSIS — L538 Other specified erythematous conditions: Secondary | ICD-10-CM | POA: Diagnosis not present

## 2023-06-16 DIAGNOSIS — D225 Melanocytic nevi of trunk: Secondary | ICD-10-CM | POA: Diagnosis not present

## 2023-06-16 DIAGNOSIS — L821 Other seborrheic keratosis: Secondary | ICD-10-CM | POA: Diagnosis not present

## 2023-06-16 DIAGNOSIS — D485 Neoplasm of uncertain behavior of skin: Secondary | ICD-10-CM | POA: Diagnosis not present

## 2023-06-16 DIAGNOSIS — L814 Other melanin hyperpigmentation: Secondary | ICD-10-CM | POA: Diagnosis not present

## 2023-06-16 DIAGNOSIS — L570 Actinic keratosis: Secondary | ICD-10-CM | POA: Diagnosis not present

## 2023-06-16 DIAGNOSIS — C44629 Squamous cell carcinoma of skin of left upper limb, including shoulder: Secondary | ICD-10-CM | POA: Diagnosis not present

## 2023-07-24 DIAGNOSIS — D0462 Carcinoma in situ of skin of left upper limb, including shoulder: Secondary | ICD-10-CM | POA: Diagnosis not present

## 2023-07-24 DIAGNOSIS — C44629 Squamous cell carcinoma of skin of left upper limb, including shoulder: Secondary | ICD-10-CM | POA: Diagnosis not present

## 2023-07-24 DIAGNOSIS — L570 Actinic keratosis: Secondary | ICD-10-CM | POA: Diagnosis not present

## 2023-09-04 DIAGNOSIS — E119 Type 2 diabetes mellitus without complications: Secondary | ICD-10-CM | POA: Diagnosis not present

## 2023-09-04 LAB — HM DIABETES EYE EXAM

## 2023-10-10 ENCOUNTER — Other Ambulatory Visit: Payer: Self-pay | Admitting: Family Medicine

## 2023-10-13 ENCOUNTER — Other Ambulatory Visit: Payer: Self-pay | Admitting: Family Medicine

## 2023-10-13 DIAGNOSIS — E1169 Type 2 diabetes mellitus with other specified complication: Secondary | ICD-10-CM

## 2023-10-13 DIAGNOSIS — E669 Obesity, unspecified: Secondary | ICD-10-CM

## 2023-10-13 NOTE — Telephone Encounter (Signed)
Prescription Request  10/13/2023  LOV: 04/17/2023  What is the name of the medication or equipment? Semaglutide,0.25 or 0.5MG /DOS, (OZEMPIC, 0.25 OR 0.5 MG/DOSE,) 2 MG/1.5ML SOPN   Have you contacted your pharmacy to request a refill? Yes   Which pharmacy would you like this sent to?  [facility] - [place], [place] - [address] [place] [address] Phone: [phone] Fax: [phone]    Patient notified that their request is being sent to the clinical staff for review and that they should receive a response within 2 business days.   Please advise at [facility] [phone]

## 2023-10-14 MED ORDER — OZEMPIC (0.25 OR 0.5 MG/DOSE) 2 MG/1.5ML ~~LOC~~ SOPN
0.5000 mg | PEN_INJECTOR | SUBCUTANEOUS | 3 refills | Status: DC
Start: 2023-10-14 — End: 2024-04-18

## 2023-10-14 NOTE — Telephone Encounter (Signed)
Sent. Thanks.   

## 2023-10-14 NOTE — Telephone Encounter (Signed)
Ozempic Last rx:  10/20/22, #4.5 mL Last OV: 04/17/23, Welcome 2 MCR Next OV:  10/22/23, 6 mo DM f/u

## 2023-10-22 ENCOUNTER — Ambulatory Visit: Payer: Medicare PPO | Admitting: Family Medicine

## 2023-10-22 ENCOUNTER — Encounter: Payer: Self-pay | Admitting: Family Medicine

## 2023-10-22 VITALS — BP 124/68 | HR 80 | Temp 98.6°F | Ht 69.0 in | Wt 278.2 lb

## 2023-10-22 DIAGNOSIS — Z7984 Long term (current) use of oral hypoglycemic drugs: Secondary | ICD-10-CM | POA: Diagnosis not present

## 2023-10-22 DIAGNOSIS — E1169 Type 2 diabetes mellitus with other specified complication: Secondary | ICD-10-CM | POA: Diagnosis not present

## 2023-10-22 DIAGNOSIS — E669 Obesity, unspecified: Secondary | ICD-10-CM | POA: Diagnosis not present

## 2023-10-22 DIAGNOSIS — Z23 Encounter for immunization: Secondary | ICD-10-CM | POA: Diagnosis not present

## 2023-10-22 DIAGNOSIS — Z7985 Long-term (current) use of injectable non-insulin antidiabetic drugs: Secondary | ICD-10-CM

## 2023-10-22 LAB — POCT GLYCOSYLATED HEMOGLOBIN (HGB A1C): Hemoglobin A1C: 7.2 % — AB (ref 4.0–5.6)

## 2023-10-22 NOTE — Patient Instructions (Signed)
Recheck in about 6 months at a yearly visit.  Labs ahead of time.  Take care.  Glad to see you. Thanks for your effort.

## 2023-10-22 NOTE — Progress Notes (Signed)
Diabetes:  Using medications without difficulties: yes Hypoglycemic episodes: no Hyperglycemic episodes: no Feet problems: no Blood Sugars averaging: ~130s.   eye exam within last year: yes A1c down to 7.2.   He is back to working part time.   Taking 500mg  metformin, 10mg  glipizide, 0.5mg  ozempic.    He was seen at [facility] with f/u pending.    He has some R knee aches in the AM and after prolonged sitting.  D/w pt about options.  He is putting up with his symptoms as is.  He can update me as needed.  Meds, vitals, and allergies reviewed.   ROS: Per HPI unless specifically indicated in ROS section   GEN: nad, alert and oriented HEENT: ncat NECK: supple w/o LA CV: rrr. PULM: ctab, no inc wob ABD: soft, +bs EXT: no edema SKIN: well perfused.

## 2023-10-25 NOTE — Assessment & Plan Note (Signed)
A1c down to 7.2.   He is back to working part time.   Taking 500mg  metformin, 10mg  glipizide, 0.5mg  ozempic.   Continue work on diet in the meantime. Recheck in about 6 months at a yearly visit.  Labs ahead of time.

## 2023-10-30 DIAGNOSIS — L814 Other melanin hyperpigmentation: Secondary | ICD-10-CM | POA: Diagnosis not present

## 2023-10-30 DIAGNOSIS — D225 Melanocytic nevi of trunk: Secondary | ICD-10-CM | POA: Diagnosis not present

## 2023-10-30 DIAGNOSIS — L821 Other seborrheic keratosis: Secondary | ICD-10-CM | POA: Diagnosis not present

## 2023-10-30 DIAGNOSIS — Z85828 Personal history of other malignant neoplasm of skin: Secondary | ICD-10-CM | POA: Diagnosis not present

## 2023-10-30 DIAGNOSIS — Z08 Encounter for follow-up examination after completed treatment for malignant neoplasm: Secondary | ICD-10-CM | POA: Diagnosis not present

## 2023-12-10 ENCOUNTER — Other Ambulatory Visit (HOSPITAL_COMMUNITY): Payer: Self-pay

## 2024-02-15 ENCOUNTER — Encounter: Payer: Self-pay | Admitting: Primary Care

## 2024-03-03 ENCOUNTER — Other Ambulatory Visit: Payer: Self-pay | Admitting: Family Medicine

## 2024-03-03 DIAGNOSIS — E1169 Type 2 diabetes mellitus with other specified complication: Secondary | ICD-10-CM

## 2024-03-03 NOTE — Telephone Encounter (Signed)
 LOV: 10/22/23 NOV: 04/18/24 Last refill: 03/09/23

## 2024-04-10 ENCOUNTER — Other Ambulatory Visit: Payer: Self-pay | Admitting: Family Medicine

## 2024-04-10 DIAGNOSIS — E119 Type 2 diabetes mellitus without complications: Secondary | ICD-10-CM

## 2024-04-10 DIAGNOSIS — Z125 Encounter for screening for malignant neoplasm of prostate: Secondary | ICD-10-CM

## 2024-04-11 ENCOUNTER — Other Ambulatory Visit (INDEPENDENT_AMBULATORY_CARE_PROVIDER_SITE_OTHER): Payer: Medicare PPO

## 2024-04-11 DIAGNOSIS — E119 Type 2 diabetes mellitus without complications: Secondary | ICD-10-CM

## 2024-04-11 DIAGNOSIS — Z125 Encounter for screening for malignant neoplasm of prostate: Secondary | ICD-10-CM

## 2024-04-11 LAB — LIPID PANEL
Cholesterol: 213 mg/dL — ABNORMAL HIGH (ref 0–200)
HDL: 60.7 mg/dL (ref >39.00)
LDL Cholesterol: 140 mg/dL — ABNORMAL HIGH (ref 0–99)
NonHDL: 152.54
Total CHOL/HDL Ratio: 4
Triglycerides: 62 mg/dL (ref 0.0–149.0)
VLDL: 12.4 mg/dL (ref 0.0–40.0)

## 2024-04-11 LAB — CBC WITH DIFFERENTIAL/PLATELET
Basophils Absolute: 0.1 10*3/uL (ref 0.0–0.1)
Basophils Relative: 0.7 % (ref 0.0–3.0)
Eosinophils Absolute: 0.4 10*3/uL (ref 0.0–0.7)
Eosinophils Relative: 4.9 % (ref 0.0–5.0)
HCT: 45.9 % (ref 39.0–52.0)
Hemoglobin: 15.2 g/dL (ref 13.0–17.0)
Lymphocytes Relative: 37.5 % (ref 12.0–46.0)
Lymphs Abs: 3 10*3/uL (ref 0.7–4.0)
MCHC: 33.1 g/dL (ref 30.0–36.0)
MCV: 88.4 fl (ref 78.0–100.0)
Monocytes Absolute: 0.8 10*3/uL (ref 0.1–1.0)
Monocytes Relative: 9.8 % (ref 3.0–12.0)
Neutro Abs: 3.7 10*3/uL (ref 1.4–7.7)
Neutrophils Relative %: 47.1 % (ref 43.0–77.0)
Platelets: 227 10*3/uL (ref 150.0–400.0)
RBC: 5.2 Mil/uL (ref 4.22–5.81)
RDW: 13.6 % (ref 11.5–15.5)
WBC: 7.9 10*3/uL (ref 4.0–10.5)

## 2024-04-11 LAB — COMPREHENSIVE METABOLIC PANEL WITH GFR
ALT: 11 U/L (ref 0–53)
AST: 12 U/L (ref 0–37)
Albumin: 4.1 g/dL (ref 3.5–5.2)
Alkaline Phosphatase: 50 U/L (ref 39–117)
BUN: 12 mg/dL (ref 6–23)
CO2: 25 meq/L (ref 19–32)
Calcium: 8.8 mg/dL (ref 8.4–10.5)
Chloride: 101 meq/L (ref 96–112)
Creatinine, Ser: 0.73 mg/dL (ref 0.40–1.50)
GFR: 94.77 mL/min (ref >60.00)
Glucose, Bld: 166 mg/dL — ABNORMAL HIGH (ref 70–99)
Potassium: 4.4 meq/L (ref 3.5–5.1)
Sodium: 136 meq/L (ref 135–145)
Total Bilirubin: 0.6 mg/dL (ref 0.2–1.2)
Total Protein: 6.9 g/dL (ref 6.0–8.3)

## 2024-04-11 LAB — MICROALBUMIN / CREATININE URINE RATIO
Creatinine,U: 137.8 mg/dL
Microalb Creat Ratio: 8.5 mg/g (ref 0.0–30.0)
Microalb, Ur: 1.2 mg/dL (ref 0.0–1.9)

## 2024-04-11 LAB — PSA, MEDICARE: PSA: 4.28 ng/mL — ABNORMAL HIGH (ref 0.10–4.00)

## 2024-04-11 LAB — HEMOGLOBIN A1C: Hgb A1c MFr Bld: 7.9 % — ABNORMAL HIGH (ref 4.6–6.5)

## 2024-04-18 ENCOUNTER — Ambulatory Visit: Payer: Medicare PPO | Admitting: Family Medicine

## 2024-04-18 ENCOUNTER — Encounter: Payer: Self-pay | Admitting: Family Medicine

## 2024-04-18 VITALS — BP 122/78 | HR 89 | Temp 97.9°F | Ht 68.5 in | Wt 274.4 lb

## 2024-04-18 DIAGNOSIS — E1169 Type 2 diabetes mellitus with other specified complication: Secondary | ICD-10-CM | POA: Diagnosis not present

## 2024-04-18 DIAGNOSIS — E119 Type 2 diabetes mellitus without complications: Secondary | ICD-10-CM

## 2024-04-18 DIAGNOSIS — E669 Obesity, unspecified: Secondary | ICD-10-CM

## 2024-04-18 DIAGNOSIS — E7800 Pure hypercholesterolemia, unspecified: Secondary | ICD-10-CM | POA: Diagnosis not present

## 2024-04-18 DIAGNOSIS — Z7189 Other specified counseling: Secondary | ICD-10-CM

## 2024-04-18 DIAGNOSIS — Z0000 Encounter for general adult medical examination without abnormal findings: Secondary | ICD-10-CM

## 2024-04-18 DIAGNOSIS — Z125 Encounter for screening for malignant neoplasm of prostate: Secondary | ICD-10-CM

## 2024-04-18 DIAGNOSIS — R972 Elevated prostate specific antigen [PSA]: Secondary | ICD-10-CM

## 2024-04-18 MED ORDER — SEMAGLUTIDE (1 MG/DOSE) 4 MG/3ML ~~LOC~~ SOPN
1.0000 mg | PEN_INJECTOR | SUBCUTANEOUS | 5 refills | Status: DC
Start: 2024-04-18 — End: 2024-08-12

## 2024-04-18 NOTE — Patient Instructions (Addendum)
 Let me know if you can't get scheduled with GI.  Take care.  Glad to see you. I would like to recheck your A1c and PSA before a visit in about 3-4 months.  Finish your current pen of ozempic  and then increase to 1mg  a week.  Take care.  Glad to see you.

## 2024-04-18 NOTE — Progress Notes (Unsigned)
 Flu previously done Shingles previously done PNA-20 2024 Tetanus 2018 Covid vaccine prev done.  Colon 2021, d/w pt about 2025 f/u.   PSA elevated 2025.  H/o LUTS with slower stream.   Advance directive-wife designated if patient were incapacitated.  Diabetes:  Using medications without difficulties: yes Hypoglycemic episodes:no Hyperglycemic episodes: no Feet problems: no Blood Sugars averaging: usually ~130-140s eye exam within last year: yes Sleep disrupted caring for his mother in law.   A1c higher than prev.   Chest pain with exertion:no Edema:no Short of breath:no  Prev statin intolerant due to aches.    PMH and SH reviewed.   Vital signs, Meds and allergies reviewed.  ROS: Per HPI unless specifically indicated in ROS section   GEN: nad, alert and oriented HEENT: ncat, B cerumen impaction improved with curette, he consented.  Hearing improved after tx.   NECK: supple w/o LA CV: rrr PULM: ctab, no inc wob ABD: soft, +bs EXT: no edema SKIN: no acute rash  Diabetic foot exam: Normal inspection No skin breakdown No calluses  Normal DP pulses Normal sensation to light tough and monofilament Nails normal  35 minutes were devoted to patient care in this encounter (this includes time spent reviewing the patient's file/history, interviewing and examining the patient, counseling/reviewing plan with patient).

## 2024-04-20 DIAGNOSIS — R972 Elevated prostate specific antigen [PSA]: Secondary | ICD-10-CM | POA: Insufficient documentation

## 2024-04-20 DIAGNOSIS — Z0000 Encounter for general adult medical examination without abnormal findings: Secondary | ICD-10-CM | POA: Insufficient documentation

## 2024-04-20 NOTE — Assessment & Plan Note (Signed)
 Flu previously done Shingles previously done PNA-20 2024 Tetanus 2018 Covid vaccine prev done.  Colon 2021, d/w pt about 2025 f/u.   PSA elevated 2025.  H/o LUTS with slower stream.   Advance directive-wife designated if patient were incapacitated.

## 2024-04-20 NOTE — Assessment & Plan Note (Signed)
 Finish current pen of ozempic  and then increase to 1mg  a week.  Continue work on diet and exercise and recheck periodically.

## 2024-04-20 NOTE — Assessment & Plan Note (Signed)
 Mild PSA elevation.  Discussed options. Will recheck PSA before a visit in about 3-4 months.  If still elevated we can refer to urology.

## 2024-04-20 NOTE — Assessment & Plan Note (Signed)
 Prev statin intolerant due to aches.

## 2024-04-20 NOTE — Assessment & Plan Note (Signed)
 Advance directive- wife designated if patient were incapacitated.

## 2024-05-24 ENCOUNTER — Other Ambulatory Visit: Payer: Self-pay | Admitting: Family Medicine

## 2024-05-25 NOTE — Telephone Encounter (Signed)
 LOV:04/18/24 NOV:08/12/24 LAST REFILL: sildenafil  (REVATIO ) 20 MG tablet 01/23/23 50 TABLETS 2 REFILLS

## 2024-05-30 DIAGNOSIS — L03115 Cellulitis of right lower limb: Secondary | ICD-10-CM | POA: Diagnosis not present

## 2024-06-09 ENCOUNTER — Ambulatory Visit: Payer: Self-pay

## 2024-06-09 NOTE — Telephone Encounter (Signed)
 FYI Only or Action Required?: FYI only for provider.  Patient was last seen in primary care on 04/18/2024 by [name], MD. Called Nurse Triage reporting discoloration of toes and prior swelling. Symptoms began a week ago. Interventions attempted: Prescription medications: doxycycline  and Other: UC visit. Symptoms are: gradually improving.  Triage Disposition: See Within 3 Days in Office (overriding Home Care)  Patient/caregiver understands and will follow disposition?: Yes      Copied from CRM [id number]. Topic: Clinical - Red Word Triage >> Jun 09, 2024  9:24 AM Mesmerise C wrote: [facility] that prompted transfer to Nurse Triage: Patient stated there's discoloration of his toes showing red on his right foot has diabetes as well, no pain seen a doctor while out of town last Monday or Tuesday because there was swelling it has went down since but still showing discoloration Reason for Disposition  Diabetes - foot problems, questions about  Answer Assessment - Initial Assessment Questions 1. SYMPTOM: What's the main symptom you're concerned about? (e.g., rash, sore, callus, drainage, numbness)     Discoloration [place] toes [place] beach last [place], know some of it sun related, tick bite also behind knee had bull's eye, UC looked at toe and tick bite and put on 10 days doxycycline  today's last day, think it's more sunburn, looks like irritated to wife, little bit improved 2. LOCATION: Where is the  discoloration located? (e.g., foot/toe, top/bottom, left/right)     Redness to few of the toes and little bit on top of foot, sort of both feet, 2 toes mainly look the worst 3. APPEARANCE: What does the area look like? (e.g., normal, red, swollen; size)     Looked like was trying to blister last week but didn't, not swollen, swelling just to the area 4. ONSET: When did the  symptoms  start?     A week or so 5. PAIN: Is there any pain? If Yes, ask: How bad is it? (Scale: 1-10; mild, moderate,  severe)     No pain 7. OTHER SYMPTOMS: Do you have any other symptoms? (e.g., fever, weakness)     No fever, weakness or other, no peeling or breakage to skin at all  Tick bite been feeling much better  Scheduled appt with PCP office to get feet examined since still symptomatic at end of course of antibx, advised call back if worsening or new symptoms like spreading redness, fever, weakness, swelling, or pain.  Protocols used: Diabetes - Foot Problems and Questions-A-AH

## 2024-06-09 NOTE — Telephone Encounter (Signed)
 Noted, will evaluate.

## 2024-06-10 ENCOUNTER — Encounter: Payer: Self-pay | Admitting: Primary Care

## 2024-06-10 ENCOUNTER — Ambulatory Visit: Admitting: Primary Care

## 2024-06-10 VITALS — BP 118/60 | HR 97 | Temp 97.3°F | Ht 68.5 in | Wt 271.0 lb

## 2024-06-10 DIAGNOSIS — L03031 Cellulitis of right toe: Secondary | ICD-10-CM | POA: Diagnosis not present

## 2024-06-10 NOTE — Progress Notes (Signed)
 Subjective:    Patient ID: Brett Stanley, male    DOB: 1955/12/26, 68 y.o.   MRN: [id number]  HPI  Desmund KHARY SCHABEN is a very pleasant 68 y.o. male patient of Dr. Cleatus with a history of type 2 diabetes, hyperlipidemia, hyperlipidemia, OSA who presents today to discuss pedal edema.  He contacted our office yesterday with reports of discoloration of his toes, particularly the right 3rd toe with erythema to his right foot without pain.  Because of the symptoms he was scheduled for an appointment.  Today he discusses that his symptoms began last week while on vacation at the beach. He also noticed a rash to the right popliteal fossa after pulling off a tick 1 week prior. He was treated with 10 days of doxycycline . He completed his course of doxycycline  yesterday. His toe swelling and erythema have improved. The site of the tick bite has nearly abated. He did get a sunburn to his feet from being at the beach and on Doxycycline . He attempted to avoid the sun as much as possible.   He denies new joint aches, fevers, headaches.    Review of Systems  Constitutional:  Negative for fever.  Skin:  Positive for color change and wound. Negative for rash.         Past Medical History:  Diagnosis Date   Allergy    GERD (gastroesophageal reflux disease)    HTN (hypertension)    Hyperlipidemia    Sleep apnea    improved, off cpap as of 2024.   Tubular adenoma of colon 2010   Type II or unspecified type diabetes mellitus without mention of complication, not stated as uncontrolled     Social History   Socioeconomic History   Marital status: Married    Spouse name: Not on file   Number of children: 2   Years of education: Not on file   Highest education level: Not on file  Occupational History   Occupation: Warden/ranger    Employer: [employer]  Tobacco Use   Smoking status: Never   Smokeless tobacco: Former    Types: Engineer, drilling   Vaping status: Never Used   Substance and Sexual Activity   Alcohol use: Yes    Alcohol/week: 0.0 standard drinks of alcohol    Comment: very rare use   Drug use: No   Sexual activity: Not on file  Other Topics Concern   Not on file  Social History Narrative   Married 1988   2 sons   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 Amiel    Family History  Problem Relation Age of Onset   Alzheimer's disease Mother    Diabetes Mother    Hypertension Other    Cancer Other        Leukemia   Sleep apnea Sister    Diabetes Maternal Uncle    Depression Maternal Uncle    Stroke Maternal Grandfather    Colon cancer Neg Hx  Prostate cancer Neg Hx    Colon polyps Neg Hx    Esophageal cancer Neg Hx    Rectal cancer Neg Hx    Stomach cancer Neg Hx     Allergies  Allergen Reactions   Atorvastatin  Other (See Comments)    Joint aches   Erythromycin Nausea Only   Metformin  And Related Other (See Comments)    Huntley tolerated dose is 500mg  a day.    Oxycodone Other (See Comments)    Nausea but he can tolerate hydrocodone     Current Outpatient Medications on File Prior to Visit  Medication Sig Dispense Refill   fexofenadine (ALLEGRA) 180 MG tablet Take 180 mg by mouth daily.     fluticasone (FLONASE) 50 MCG/ACT nasal spray Place into both nostrils daily.     glipiZIDE  (GLUCOTROL ) 5 MG tablet TAKE 2 TABLETS BY MOUTH DAILY BEFORE BREAKFAST 180 tablet 3   glucose blood (ONETOUCH VERIO) test strip Check sugar 1 time daily. E11.9 100 each 3   Ibuprofen 200 MG CAPS Take by mouth as needed.       lisinopril  (ZESTRIL ) 5 MG tablet TAKE 1 TABLET BY MOUTH DAILY 90 tablet 3   metFORMIN  (GLUCOPHAGE ) 500 MG tablet TAKE 1 TABLET BY MOUTH DAILY WITH BREAKFAST 90 tablet 3   Semaglutide , 1 MG/DOSE, 4 MG/3ML SOPN Inject 1 mg as directed once a week. 3 mL 5   sildenafil  (REVATIO ) 20 MG tablet TAKE 5 TABLETS BY MOUTH DAILY AS NEEDED 50 tablet 12   triamcinolone  cream (KENALOG ) 0.5 % Apply 1 Application topically 2 (two) times daily as needed. 454 g 0   No current facility-administered medications on file prior to visit.    BP 118/60   Pulse 97   Temp (!) 97.3 F (36.3 C) (Temporal)   Ht 5' 8.5 (1.74 m)   Wt 271 lb (122.9 kg)   SpO2 97%   BMI 40.61 kg/m  Objective:   Physical Exam Constitutional:      Appearance: He is not ill-appearing.   Cardiovascular:     Rate and Rhythm: Normal rate and regular rhythm.     Pulses:          Dorsalis pedis pulses are 2+ on the right side.       Posterior tibial pulses are 2+ on the right side.  Pulmonary:     Effort: Pulmonary effort is normal.     Breath sounds: Normal breath sounds.   Skin:    General: Skin is warm and dry.     Comments: Evidence of mild sunburn to bilateral dorsal feet. Toe of concern with small opening to the skin without drainage. Very slight swelling and light pink erythema. Appears closely similar to other toes.           Assessment & Plan:  Cellulitis of toe of right foot Assessment & Plan: Improved and nearly resolved.  Exam today reassuring.  Diabetes foot exam is UTD.  Continue to monitor, return with any new symptoms or return of prior symptoms.          Karlee Staff K Kurt Hoffmeier, NP

## 2024-06-10 NOTE — Patient Instructions (Signed)
 It was a pleasure meeting you! ? ?

## 2024-06-10 NOTE — Assessment & Plan Note (Signed)
 Improved and nearly resolved.  Exam today reassuring.  Diabetes foot exam is UTD.  Continue to monitor, return with any new symptoms or return of prior symptoms.

## 2024-06-16 DIAGNOSIS — L820 Inflamed seborrheic keratosis: Secondary | ICD-10-CM | POA: Diagnosis not present

## 2024-06-16 DIAGNOSIS — R208 Other disturbances of skin sensation: Secondary | ICD-10-CM | POA: Diagnosis not present

## 2024-06-16 DIAGNOSIS — Z85828 Personal history of other malignant neoplasm of skin: Secondary | ICD-10-CM | POA: Diagnosis not present

## 2024-06-16 DIAGNOSIS — D225 Melanocytic nevi of trunk: Secondary | ICD-10-CM | POA: Diagnosis not present

## 2024-06-16 DIAGNOSIS — L918 Other hypertrophic disorders of the skin: Secondary | ICD-10-CM | POA: Diagnosis not present

## 2024-06-16 DIAGNOSIS — L814 Other melanin hyperpigmentation: Secondary | ICD-10-CM | POA: Diagnosis not present

## 2024-06-16 DIAGNOSIS — L821 Other seborrheic keratosis: Secondary | ICD-10-CM | POA: Diagnosis not present

## 2024-06-16 DIAGNOSIS — T07XXXA Unspecified multiple injuries, initial encounter: Secondary | ICD-10-CM | POA: Diagnosis not present

## 2024-06-16 DIAGNOSIS — Z08 Encounter for follow-up examination after completed treatment for malignant neoplasm: Secondary | ICD-10-CM | POA: Diagnosis not present

## 2024-07-08 ENCOUNTER — Ambulatory Visit

## 2024-07-19 NOTE — Progress Notes (Signed)
 This encounter was created in error - please disregard.

## 2024-07-27 ENCOUNTER — Encounter: Payer: Self-pay | Admitting: Family Medicine

## 2024-07-27 ENCOUNTER — Ambulatory Visit: Admitting: Family Medicine

## 2024-07-27 ENCOUNTER — Telehealth: Payer: Self-pay

## 2024-07-27 VITALS — BP 106/62 | HR 110 | Temp 98.2°F | Ht 68.5 in | Wt 265.4 lb

## 2024-07-27 DIAGNOSIS — R197 Diarrhea, unspecified: Secondary | ICD-10-CM | POA: Diagnosis not present

## 2024-07-27 NOTE — Progress Notes (Signed)
 Subjective:    Patient ID: Brett Stanley, male    DOB: 1956-06-02, 68 y.o.   MRN: [id number]  HPI  Wt Readings from Last 3 Encounters:  07/27/24 265 lb 6 oz (120.4 kg)  06/10/24 271 lb (122.9 kg)  04/18/24 274 lb 6.4 oz (124.5 kg)   39.76 kg/m  Vitals:   07/27/24 1426  BP: 106/62  Pulse: (!) 110  Temp: 98.2 F (36.8 C)  SpO2: 97%    68 yo pt of Dr Cleatus presents with  Diarrhea  Vomiting   Yesterday am woke up at 3 am Monday night to morning - had eaten spaghetti and garlic bread  Indigestion  Took tums Then bloating  Then started having diarrhea  Gurgling belly  Liquid stool  No blood  No black stool   No fever  No body aches or chills  No appetite today   (yesterday did eat pizza)    Today has had 6-8 bms (better in afternoon)    This am- had one episode of nausea and vomiting   No known sick exposures that he knows of   Over the counter  Maalox like med  Then took omeprazole pill last night    History of DM  Takes semaglutide  and metformin  and glipizide   No side effects in the past-does not think his current symptoms are med related     Patient Active Problem List   Diagnosis Date Noted   Diarrhea 07/27/2024   Cellulitis of toe of right foot 06/10/2024   PSA elevation 04/20/2024   Healthcare maintenance 04/20/2024   Statin myopathy 07/25/2020   Sleep apnea 01/15/2020   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    Tubular adenoma of colon 2010   Type II or unspecified type diabetes mellitus without mention of complication, not stated as uncontrolled    Past Surgical History:  Procedure Laterality Date   COLONOSCOPY     2015   KNEE ARTHROSCOPY Right 2014   VASECTOMY  1997   Dr. Amiel   Social History   Tobacco Use   Smoking status: Never   Smokeless tobacco: Former    Types: Engineer, drilling   Vaping status: Never Used  Substance Use Topics   Alcohol use: Yes    Alcohol/week: 0.0 standard drinks of alcohol    Comment: very rare use   Drug use: No   Family History  Problem Relation Age of Onset   Alzheimer's disease Mother    Diabetes Mother    Hypertension Other    Cancer Other        Leukemia   Sleep apnea Sister    Diabetes Maternal Uncle    Depression Maternal Uncle    Stroke Maternal Grandfather    Colon cancer Neg Hx    Prostate cancer Neg Hx    Colon polyps Neg Hx    Esophageal cancer Neg Hx  Rectal cancer Neg Hx    Stomach cancer Neg Hx    Allergies  Allergen Reactions   Atorvastatin  Other (See Comments)    Joint aches   Erythromycin Nausea Only   Metformin  And Related Other (See Comments)    Bartley tolerated dose is 500mg  a day.    Oxycodone Other (See Comments)    Nausea but he can tolerate hydrocodone    Current Outpatient Medications on File Prior to Visit  Medication Sig Dispense Refill   fexofenadine (ALLEGRA) 180 MG tablet Take 180 mg by mouth daily.     fluticasone (FLONASE) 50 MCG/ACT nasal spray Place into both nostrils daily.     glipiZIDE  (GLUCOTROL ) 5 MG tablet TAKE 2 TABLETS BY MOUTH DAILY BEFORE BREAKFAST 180 tablet 3   glucose blood (ONETOUCH VERIO) test strip Check sugar 1 time daily. E11.9 100 each 3   Ibuprofen 200 MG CAPS Take by mouth as needed.      lisinopril  (ZESTRIL ) 5 MG tablet TAKE 1 TABLET BY MOUTH DAILY 90 tablet 3   metFORMIN  (GLUCOPHAGE ) 500 MG tablet TAKE 1 TABLET BY MOUTH DAILY WITH BREAKFAST 90 tablet 3   Semaglutide , 1  MG/DOSE, 4 MG/3ML SOPN Inject 1 mg as directed once a week. 3 mL 5   sildenafil  (REVATIO ) 20 MG tablet TAKE 5 TABLETS BY MOUTH DAILY AS NEEDED 50 tablet 12   triamcinolone  cream (KENALOG ) 0.5 % Apply 1 Application topically 2 (two) times daily as needed. 454 g 0   No current facility-administered medications on file prior to visit.    Review of Systems  Constitutional:  Positive for fatigue. Negative for activity change, appetite change, fever and unexpected weight change.  HENT:  Negative for congestion, rhinorrhea, sore throat and trouble swallowing.   Eyes:  Negative for pain, redness, itching and visual disturbance.  Respiratory:  Negative for cough, chest tightness, shortness of breath and wheezing.   Cardiovascular:  Negative for chest pain and palpitations.  Gastrointestinal:  Positive for diarrhea. Negative for abdominal pain, blood in stool, constipation, nausea, rectal pain and vomiting.       N/v resolved  Some cramping before bms  Abdomen feels bloated   Endocrine: Negative for cold intolerance, heat intolerance, polydipsia and polyuria.  Genitourinary:  Negative for difficulty urinating, dysuria, frequency and urgency.  Musculoskeletal:  Negative for arthralgias, joint swelling and myalgias.  Skin:  Negative for pallor and rash.  Neurological:  Negative for dizziness, tremors, weakness, numbness and headaches.  Hematological:  Negative for adenopathy. Does not bruise/bleed easily.  Psychiatric/Behavioral:  Negative for decreased concentration and dysphoric mood. The patient is not nervous/anxious.        Objective:   Physical Exam Constitutional:      General: He is not in acute distress.    Appearance: Normal appearance. He is well-developed. He is obese. He is not ill-appearing or diaphoretic.  HENT:     Head: Normocephalic and atraumatic.     Mouth/Throat:     Mouth: Mucous membranes are moist.  Eyes:     General: No scleral icterus.    Conjunctiva/sclera:  Conjunctivae normal.     Pupils: Pupils are equal, round, and reactive to light.  Cardiovascular:     Rate and Rhythm: Normal rate and regular rhythm.     Heart sounds: Normal heart sounds.  Pulmonary:     Effort: Pulmonary effort is normal. No respiratory distress.     Breath sounds: Normal breath sounds. No wheezing or rales.  Abdominal:  General: Abdomen is protuberant. Bowel sounds are increased. There is no distension.     Palpations: Abdomen is soft. There is no shifting dullness, hepatomegaly, splenomegaly, mass or pulsatile mass.     Tenderness: There is abdominal tenderness in the right upper quadrant, right lower quadrant, left upper quadrant and left lower quadrant. There is no guarding or rebound. Negative signs include Murphy's sign and McBurney's sign.     Comments: Mildly increased bs  Not high pitched  Very mild tenderness to deep palp on sides of abdomen   Musculoskeletal:     Cervical back: Normal range of motion and neck supple.  Lymphadenopathy:     Cervical: No cervical adenopathy.  Skin:    General: Skin is warm and dry.     Coloration: Skin is not pale.     Findings: No erythema.     Comments: Normal skin color and turgor  Brisk capillary refill   Neurological:     Mental Status: He is alert.  Psychiatric:        Mood and Affect: Mood normal.           Assessment & Plan:   Problem List Items Addressed This Visit       Other   Diarrhea - Primary   Day 2-3 with frequent watery stools and bloating  One episode of n/v  Reassuring exam Suspect viral gastroenteritis  Of note-is on metformin  and glp-1 but neither of these caused these symptoms in the past   Handout given Discussed re hydration and symptoms care  Suspect eating pizza and grilled cheese set him back yesterday Encouraged fluids only followed by bland diet when ready (BRAT) -then advance as tolerated If worse or symptoms last more than 7 d then consider stool testing and  lats Reviewed signs and symptoms of dehydration  Update if not starting to improve in a week or if worsening  Call back and Er precautions noted in detail today

## 2024-07-27 NOTE — Telephone Encounter (Signed)
 Will see patient then Agree with ER and UC precautions

## 2024-07-27 NOTE — Telephone Encounter (Signed)
 Noted. Thanks.

## 2024-07-27 NOTE — Assessment & Plan Note (Signed)
 Day 2-3 with frequent watery stools and bloating  One episode of n/v  Reassuring exam Suspect viral gastroenteritis  Of note-is on metformin  and glp-1 but neither of these caused these symptoms in the past   Handout given Discussed re hydration and symptoms care  Suspect eating pizza and grilled cheese set him back yesterday Encouraged fluids only followed by bland diet when ready (BRAT) -then advance as tolerated If worse or symptoms last more than 7 d then consider stool testing and lats Reviewed signs and symptoms of dehydration  Update if not starting to improve in a week or if worsening  Call back and Er precautions noted in detail today

## 2024-07-27 NOTE — Patient Instructions (Signed)
 I think your diarrhea is likely from a virus (viral gastroenteritis)   Focus on fluids -in sips (slow and steady)  Mostly water  Some electrolyte replacement drinks are ok   If you start to gradually improve- then you can try small amounts of bland foods BRAT diet  Bananas , rice, apple sauce , toast   (dry crackers)   Watch your blood sugar   Watch for signs and symptoms of dehydration (dry mouth/ light headed)   If not gradually improving in the next 2-3 days let us  know  If any severe symptoms go to the ER

## 2024-07-27 NOTE — Telephone Encounter (Signed)
 SABRA

## 2024-07-27 NOTE — Telephone Encounter (Signed)
 Per appt notes pt has appt with Dr Randeen 07/27/24 at 2:30. Sending note to Dr Randeen and RICK to Dr Cleatus as PCP.

## 2024-07-31 ENCOUNTER — Encounter (HOSPITAL_COMMUNITY): Payer: Self-pay

## 2024-07-31 ENCOUNTER — Encounter: Payer: Self-pay | Admitting: Emergency Medicine

## 2024-07-31 ENCOUNTER — Inpatient Hospital Stay (HOSPITAL_COMMUNITY)
Admission: EM | Admit: 2024-07-31 | Discharge: 2024-08-09 | DRG: 357 | Disposition: A | Discharge status: Home / Self Care | Attending: Student | Admitting: Student

## 2024-07-31 ENCOUNTER — Emergency Department (HOSPITAL_COMMUNITY)

## 2024-07-31 ENCOUNTER — Ambulatory Visit
Admission: EM | Admit: 2024-07-31 | Discharge: 2024-07-31 | Disposition: A | Discharge status: Home / Self Care | Attending: Nurse Practitioner | Admitting: Nurse Practitioner

## 2024-07-31 ENCOUNTER — Other Ambulatory Visit: Payer: Self-pay

## 2024-07-31 ENCOUNTER — Ambulatory Visit (INDEPENDENT_AMBULATORY_CARE_PROVIDER_SITE_OTHER)

## 2024-07-31 DIAGNOSIS — A084 Viral intestinal infection, unspecified: Secondary | ICD-10-CM | POA: Diagnosis present

## 2024-07-31 DIAGNOSIS — Z806 Family history of leukemia: Secondary | ICD-10-CM | POA: Diagnosis not present

## 2024-07-31 DIAGNOSIS — Z720 Tobacco use: Secondary | ICD-10-CM

## 2024-07-31 DIAGNOSIS — Z885 Allergy status to narcotic agent status: Secondary | ICD-10-CM

## 2024-07-31 DIAGNOSIS — E871 Hypo-osmolality and hyponatremia: Secondary | ICD-10-CM | POA: Diagnosis present

## 2024-07-31 DIAGNOSIS — Z860101 Personal history of adenomatous and serrated colon polyps: Secondary | ICD-10-CM

## 2024-07-31 DIAGNOSIS — Z823 Family history of stroke: Secondary | ICD-10-CM | POA: Diagnosis not present

## 2024-07-31 DIAGNOSIS — E876 Hypokalemia: Secondary | ICD-10-CM | POA: Diagnosis present

## 2024-07-31 DIAGNOSIS — N400 Enlarged prostate without lower urinary tract symptoms: Secondary | ICD-10-CM | POA: Diagnosis not present

## 2024-07-31 DIAGNOSIS — E1165 Type 2 diabetes mellitus with hyperglycemia: Secondary | ICD-10-CM | POA: Diagnosis present

## 2024-07-31 DIAGNOSIS — E785 Hyperlipidemia, unspecified: Secondary | ICD-10-CM | POA: Diagnosis present

## 2024-07-31 DIAGNOSIS — E119 Type 2 diabetes mellitus without complications: Secondary | ICD-10-CM

## 2024-07-31 DIAGNOSIS — R000 Tachycardia, unspecified: Secondary | ICD-10-CM | POA: Diagnosis present

## 2024-07-31 DIAGNOSIS — E66812 Obesity, class 2: Secondary | ICD-10-CM | POA: Diagnosis present

## 2024-07-31 DIAGNOSIS — D72825 Bandemia: Secondary | ICD-10-CM | POA: Diagnosis not present

## 2024-07-31 DIAGNOSIS — D72829 Elevated white blood cell count, unspecified: Secondary | ICD-10-CM | POA: Diagnosis present

## 2024-07-31 DIAGNOSIS — R140 Abdominal distension (gaseous): Secondary | ICD-10-CM | POA: Diagnosis not present

## 2024-07-31 DIAGNOSIS — Z79899 Other long term (current) drug therapy: Secondary | ICD-10-CM

## 2024-07-31 DIAGNOSIS — R1084 Generalized abdominal pain: Secondary | ICD-10-CM

## 2024-07-31 DIAGNOSIS — Z7984 Long term (current) use of oral hypoglycemic drugs: Secondary | ICD-10-CM

## 2024-07-31 DIAGNOSIS — Z0389 Encounter for observation for other suspected diseases and conditions ruled out: Secondary | ICD-10-CM | POA: Diagnosis not present

## 2024-07-31 DIAGNOSIS — K5732 Diverticulitis of large intestine without perforation or abscess without bleeding: Secondary | ICD-10-CM | POA: Diagnosis present

## 2024-07-31 DIAGNOSIS — K529 Noninfective gastroenteritis and colitis, unspecified: Secondary | ICD-10-CM | POA: Diagnosis not present

## 2024-07-31 DIAGNOSIS — Z6838 Body mass index (BMI) 38.0-38.9, adult: Secondary | ICD-10-CM | POA: Diagnosis not present

## 2024-07-31 DIAGNOSIS — Z881 Allergy status to other antibiotic agents status: Secondary | ICD-10-CM

## 2024-07-31 DIAGNOSIS — R109 Unspecified abdominal pain: Secondary | ICD-10-CM | POA: Diagnosis not present

## 2024-07-31 DIAGNOSIS — I10 Essential (primary) hypertension: Secondary | ICD-10-CM | POA: Diagnosis present

## 2024-07-31 DIAGNOSIS — K5792 Diverticulitis of intestine, part unspecified, without perforation or abscess without bleeding: Principal | ICD-10-CM

## 2024-07-31 DIAGNOSIS — Z833 Family history of diabetes mellitus: Secondary | ICD-10-CM

## 2024-07-31 DIAGNOSIS — G4733 Obstructive sleep apnea (adult) (pediatric): Secondary | ICD-10-CM | POA: Diagnosis present

## 2024-07-31 DIAGNOSIS — K56609 Unspecified intestinal obstruction, unspecified as to partial versus complete obstruction: Secondary | ICD-10-CM | POA: Diagnosis present

## 2024-07-31 DIAGNOSIS — K56600 Partial intestinal obstruction, unspecified as to cause: Secondary | ICD-10-CM | POA: Diagnosis not present

## 2024-07-31 DIAGNOSIS — Z818 Family history of other mental and behavioral disorders: Secondary | ICD-10-CM

## 2024-07-31 DIAGNOSIS — E7800 Pure hypercholesterolemia, unspecified: Secondary | ICD-10-CM | POA: Diagnosis not present

## 2024-07-31 DIAGNOSIS — K565 Intestinal adhesions [bands], unspecified as to partial versus complete obstruction: Secondary | ICD-10-CM | POA: Diagnosis not present

## 2024-07-31 DIAGNOSIS — Z8249 Family history of ischemic heart disease and other diseases of the circulatory system: Secondary | ICD-10-CM

## 2024-07-31 DIAGNOSIS — K5900 Constipation, unspecified: Secondary | ICD-10-CM | POA: Diagnosis not present

## 2024-07-31 DIAGNOSIS — Z888 Allergy status to other drugs, medicaments and biological substances status: Secondary | ICD-10-CM

## 2024-07-31 DIAGNOSIS — R188 Other ascites: Secondary | ICD-10-CM | POA: Diagnosis present

## 2024-07-31 DIAGNOSIS — R197 Diarrhea, unspecified: Secondary | ICD-10-CM | POA: Diagnosis not present

## 2024-07-31 DIAGNOSIS — K648 Other hemorrhoids: Secondary | ICD-10-CM | POA: Diagnosis present

## 2024-07-31 DIAGNOSIS — K5669 Other partial intestinal obstruction: Secondary | ICD-10-CM | POA: Diagnosis not present

## 2024-07-31 DIAGNOSIS — Z4682 Encounter for fitting and adjustment of non-vascular catheter: Secondary | ICD-10-CM | POA: Diagnosis not present

## 2024-07-31 DIAGNOSIS — R1110 Vomiting, unspecified: Secondary | ICD-10-CM | POA: Diagnosis not present

## 2024-07-31 DIAGNOSIS — Z820 Family history of epilepsy and other diseases of the nervous system: Secondary | ICD-10-CM

## 2024-07-31 DIAGNOSIS — K219 Gastro-esophageal reflux disease without esophagitis: Secondary | ICD-10-CM | POA: Diagnosis present

## 2024-07-31 DIAGNOSIS — N281 Cyst of kidney, acquired: Secondary | ICD-10-CM | POA: Diagnosis not present

## 2024-07-31 DIAGNOSIS — K567 Ileus, unspecified: Secondary | ICD-10-CM | POA: Diagnosis not present

## 2024-07-31 LAB — POCT URINE DIPSTICK
Blood, UA: NEGATIVE
Glucose, UA: 250 mg/dL — AB
Leukocytes, UA: NEGATIVE
Nitrite, UA: NEGATIVE
POC PROTEIN,UA: 100 — AB
Spec Grav, UA: >=1.03 — AB (ref 1.010–1.025)
Urobilinogen, UA: 1 U/dL
pH, UA: 6 (ref 5.0–8.0)

## 2024-07-31 LAB — COMPREHENSIVE METABOLIC PANEL WITH GFR
ALT: 16 U/L (ref 0–44)
AST: 20 U/L (ref 15–41)
Albumin: 3.3 g/dL — ABNORMAL LOW (ref 3.5–5.0)
Alkaline Phosphatase: 48 U/L (ref 38–126)
Anion gap: 13 (ref 5–15)
BUN: 12 mg/dL (ref 8–23)
CO2: 20 mmol/L — ABNORMAL LOW (ref 22–32)
Calcium: 9.4 mg/dL (ref 8.9–10.3)
Chloride: 100 mmol/L (ref 98–111)
Creatinine, Ser: 1.04 mg/dL (ref 0.61–1.24)
GFR, Estimated: >60 mL/min (ref >60)
Glucose, Bld: 194 mg/dL — ABNORMAL HIGH (ref 70–99)
Potassium: 3.6 mmol/L (ref 3.5–5.1)
Sodium: 133 mmol/L — ABNORMAL LOW (ref 135–145)
Total Bilirubin: 1.3 mg/dL — ABNORMAL HIGH (ref 0.0–1.2)
Total Protein: 7.1 g/dL (ref 6.5–8.1)

## 2024-07-31 LAB — CBC
HCT: 47.3 % (ref 39.0–52.0)
Hemoglobin: 15.8 g/dL (ref 13.0–17.0)
MCH: 28.7 pg (ref 26.0–34.0)
MCHC: 33.4 g/dL (ref 30.0–36.0)
MCV: 85.8 fL (ref 80.0–100.0)
Platelets: 282 K/uL (ref 150–400)
RBC: 5.51 MIL/uL (ref 4.22–5.81)
RDW: 12.7 % (ref 11.5–15.5)
WBC: 11.6 K/uL — ABNORMAL HIGH (ref 4.0–10.5)
nRBC: 0 % (ref 0.0–0.2)

## 2024-07-31 LAB — URINALYSIS, ROUTINE W REFLEX MICROSCOPIC
Bacteria, UA: NONE SEEN
Glucose, UA: >=500 mg/dL — AB
Hgb urine dipstick: NEGATIVE
Ketones, ur: 5 mg/dL — AB
Leukocytes,Ua: NEGATIVE
Nitrite: NEGATIVE
Protein, ur: 100 mg/dL — AB
Specific Gravity, Urine: 1.035 — ABNORMAL HIGH (ref 1.005–1.030)
pH: 5 (ref 5.0–8.0)

## 2024-07-31 LAB — CBG MONITORING, ED: Glucose-Capillary: 111 mg/dL — ABNORMAL HIGH (ref 70–99)

## 2024-07-31 LAB — HIV ANTIBODY (ROUTINE TESTING W REFLEX): HIV Screen 4th Generation wRfx: NONREACTIVE

## 2024-07-31 LAB — LIPASE, BLOOD: Lipase: 41 U/L (ref 11–51)

## 2024-07-31 MED ORDER — METRONIDAZOLE 500 MG/100ML IV SOLN
500.0000 mg | Freq: Once | INTRAVENOUS | Status: AC
  Administered 2024-07-31: 500 mg via INTRAVENOUS
  Filled 2024-07-31: qty 100

## 2024-07-31 MED ORDER — INSULIN ASPART 100 UNIT/ML IJ SOLN: 0.0000 [IU] | Freq: Four times a day (QID) | INTRAMUSCULAR | Status: DC

## 2024-07-31 MED ORDER — ALBUTEROL SULFATE (2.5 MG/3ML) 0.083% IN NEBU: 2.5000 mg | INHALATION_SOLUTION | Freq: Four times a day (QID) | RESPIRATORY_TRACT | Status: DC | PRN

## 2024-07-31 MED ORDER — ACETAMINOPHEN 10 MG/ML IV SOLN
1000.0000 mg | Freq: Once | INTRAVENOUS | Status: AC
  Administered 2024-07-31: 1000 mg via INTRAVENOUS
  Filled 2024-07-31: qty 100

## 2024-07-31 MED ORDER — SODIUM CHLORIDE 0.9% FLUSH
3.0000 mL | Freq: Two times a day (BID) | INTRAVENOUS | Status: DC
  Administered 2024-07-31 – 2024-08-09 (×15): 3 mL via INTRAVENOUS

## 2024-07-31 MED ORDER — CIPROFLOXACIN IN D5W 400 MG/200ML IV SOLN
400.0000 mg | Freq: Once | INTRAVENOUS | Status: AC
  Administered 2024-07-31: 400 mg via INTRAVENOUS
  Filled 2024-07-31: qty 200

## 2024-07-31 MED ORDER — ACETAMINOPHEN 650 MG RE SUPP: 650.0000 mg | Freq: Four times a day (QID) | RECTAL | Status: DC | PRN

## 2024-07-31 MED ORDER — ACETAMINOPHEN 325 MG PO TABS
650.0000 mg | ORAL_TABLET | Freq: Four times a day (QID) | ORAL | Status: DC | PRN
  Administered 2024-07-31 – 2024-08-01 (×2): 650 mg via ORAL
  Filled 2024-07-31 (×2): qty 2

## 2024-07-31 MED ORDER — ENOXAPARIN SODIUM 40 MG/0.4ML IJ SOSY
40.0000 mg | PREFILLED_SYRINGE | INTRAMUSCULAR | Status: DC
  Administered 2024-08-01 – 2024-08-09 (×9): 40 mg via SUBCUTANEOUS
  Filled 2024-07-31 (×9): qty 0.4

## 2024-07-31 MED ORDER — ONDANSETRON HCL 4 MG/2ML IJ SOLN
4.0000 mg | Freq: Four times a day (QID) | INTRAMUSCULAR | Status: DC | PRN
  Administered 2024-07-31 – 2024-08-04 (×3): 4 mg via INTRAVENOUS
  Filled 2024-07-31 (×3): qty 2

## 2024-07-31 MED ORDER — ONDANSETRON HCL 4 MG PO TABS
4.0000 mg | ORAL_TABLET | Freq: Four times a day (QID) | ORAL | Status: DC | PRN
  Administered 2024-08-02: 4 mg via ORAL
  Filled 2024-07-31: qty 1

## 2024-07-31 MED ORDER — METRONIDAZOLE 500 MG/100ML IV SOLN
500.0000 mg | Freq: Two times a day (BID) | INTRAVENOUS | Status: DC
  Administered 2024-08-01 – 2024-08-09 (×17): 500 mg via INTRAVENOUS
  Filled 2024-07-31 (×17): qty 100

## 2024-07-31 MED ORDER — IOHEXOL 350 MG/ML SOLN
75.0000 mL | Freq: Once | INTRAVENOUS | Status: AC | PRN
  Administered 2024-07-31: 75 mL via INTRAVENOUS

## 2024-07-31 MED ORDER — ACETAMINOPHEN 325 MG PO TABS: 650.0000 mg | ORAL_TABLET | Freq: Four times a day (QID) | ORAL | Status: DC | PRN

## 2024-07-31 MED ORDER — HYDRALAZINE HCL 20 MG/ML IJ SOLN: 10.0000 mg | INTRAMUSCULAR | Status: DC | PRN

## 2024-07-31 MED ORDER — MORPHINE SULFATE (PF) 2 MG/ML IV SOLN: 2.0000 mg | INTRAVENOUS | Status: DC | PRN

## 2024-07-31 MED ORDER — CIPROFLOXACIN IN D5W 400 MG/200ML IV SOLN
400.0000 mg | Freq: Two times a day (BID) | INTRAVENOUS | Status: DC
  Administered 2024-08-01 – 2024-08-02 (×3): 400 mg via INTRAVENOUS
  Filled 2024-07-31 (×4): qty 200

## 2024-07-31 MED ORDER — SODIUM CHLORIDE 0.9 % IV SOLN: INTRAVENOUS | Status: DC

## 2024-07-31 NOTE — Discharge Instructions (Addendum)
 You were evaluated today for abdominal pain, nausea, and vomiting. Your exam, urine test, and abdominal x-ray show findings most consistent with a small bowel obstruction. This condition requires immediate evaluation and treatment in the emergency department. You are stable to go by private vehicle, and your wife will drive you. Until you are evaluated further, do not eat or drink anything. At the hospital, additional testing such as a CT scan will likely be performed to confirm the diagnosis and guide treatment. If your pain suddenly worsens, if you begin to vomit repeatedly, if you develop fever, dizziness, fainting, or any new concerning symptoms on the way, have your wife pull over immediately and call 911.

## 2024-07-31 NOTE — ED Notes (Signed)
 Pt ambulated to restroom without incident.

## 2024-07-31 NOTE — Consult Note (Signed)
 Consulting Physician: Deward PARAS Courtlynn Holloman  Referring Provider: Dr. Garrick  Chief Complaint: Abdominal pain, nausea, vomiting  Reason for Consult: Diverticulitis, small bowel obstruction   Subjective   HPI: Brett Stanley is an 68 y.o. male who is here for abdominal pain, nausea and vomiting.    He had symptoms of diarrhea starting on Tuesday.  This continued till Thursday.  He had associated vomiting.  He saw his physician in an outpatient appointment.  His last bowel movement was Thursday but he felt he had cleaned himself out with all the diarrhea the days before.  He felt good on Friday and mowed the lawn.  He started feeling bad again on Saturday though so he presented to the ER today as these symptoms aren't getting better.  He has never had an abdominal surgery before.  He has never had diverticulitis before.  His last colonoscopy was about 10 years ago and he is due for another.  Past Medical History:  Diagnosis Date   Allergy    GERD (gastroesophageal reflux disease)    HTN (hypertension)    Hyperlipidemia    Sleep apnea    improved, off cpap as of 2024.   Tubular adenoma of colon 2010   Type II or unspecified type diabetes mellitus without mention of complication, not stated as uncontrolled     Past Surgical History:  Procedure Laterality Date   COLONOSCOPY     2015   KNEE ARTHROSCOPY Right 2014   VASECTOMY  1997   Dr. Amiel    Family History  Problem Relation Age of Onset   Alzheimer's disease Mother    Diabetes Mother    Hypertension Other    Cancer Other        Leukemia   Sleep apnea Sister    Diabetes Maternal Uncle    Depression Maternal Uncle    Stroke Maternal Grandfather    Colon cancer Neg Hx    Prostate cancer Neg Hx    Colon polyps Neg Hx    Esophageal cancer Neg Hx    Rectal cancer Neg Hx    Stomach cancer Neg Hx     Social:  reports that he has never smoked. He has quit using smokeless tobacco.  His smokeless tobacco use  included chew. He reports current alcohol use. He reports that he does not use drugs.  Allergies:  Allergies  Allergen Reactions   Atorvastatin  Other (See Comments)    Joint aches   Erythromycin Nausea Only   Metformin  And Related Other (See Comments)    Daimen tolerated dose is 500mg  a day.    Oxycodone Other (See Comments)    Nausea but he can tolerate hydrocodone     Medications: Current Outpatient Medications  Medication Instructions   fexofenadine (ALLEGRA) 180 mg, Oral, Daily   fluticasone (FLONASE) 50 MCG/ACT nasal spray 1 spray, Each Nare, Daily   glipiZIDE  (GLUCOTROL ) 5 MG tablet TAKE 2 TABLETS BY MOUTH DAILY BEFORE BREAKFAST   glucose blood (ONETOUCH VERIO) test strip Check sugar 1 time daily. E11.9   lisinopril  (ZESTRIL ) 5 mg, Oral, Daily   metFORMIN  (GLUCOPHAGE ) 500 mg, Oral, Daily with breakfast   Semaglutide  (1 MG/DOSE) 1 mg, Injection, Weekly   sildenafil  (REVATIO ) 20 MG tablet TAKE 5 TABLETS BY MOUTH DAILY AS NEEDED   triamcinolone  cream (KENALOG ) 0.5 % 1 Application, Topical, 2 times daily PRN    ROS - all of the below systems have been reviewed with the patient and positives are indicated with bold  text General: chills, fever or night sweats Eyes: blurry vision or double vision ENT: epistaxis or sore throat Allergy/Immunology: itchy/watery eyes or nasal congestion Hematologic/Lymphatic: bleeding problems, blood clots or swollen lymph nodes Endocrine: temperature intolerance or unexpected weight changes Breast: new or changing breast lumps or nipple discharge Resp: cough, shortness of breath, or wheezing CV: chest pain or dyspnea on exertion GI: as per HPI GU: dysuria, trouble voiding, or hematuria MSK: joint pain or joint stiffness Neuro: TIA or stroke symptoms Derm: pruritus and skin lesion changes Psych: anxiety and depression  Objective   PE Blood pressure 117/88, pulse 83, temperature 97.8 F (36.6 C), temperature source Oral, resp. rate 18, height  5' 9 (1.753 m), weight 117.9 kg, SpO2 98%. Constitutional: NAD; conversant; no deformities Eyes: Moist conjunctiva; no lid lag; anicteric; PERRL Neck: Trachea midline; no thyromegaly Lungs: Normal respiratory effort; no tactile fremitus CV: RRR; no palpable thrills; no pitting edema GI: Abd distended, generalized tenderness; no palpable hepatosplenomegaly MSK: Normal range of motion of extremities; no clubbing/cyanosis Psychiatric: Appropriate affect; alert and oriented x3 Lymphatic: No palpable cervical or axillary lymphadenopathy  Results for orders placed or performed during the hospital encounter of 07/31/24 (from the past 24 hours)  Urinalysis, Routine w reflex microscopic -Urine, Clean Catch     Status: Abnormal   Collection Time: 07/31/24 10:52 AM  Result Value Ref Range   Color, Urine AMBER (A) YELLOW   APPearance HAZY (A) CLEAR   Specific Gravity, Urine 1.035 (H) 1.005 - 1.030   pH 5.0 5.0 - 8.0   Glucose, UA >=500 (A) NEGATIVE mg/dL   Hgb urine dipstick NEGATIVE NEGATIVE   Bilirubin Urine SMALL (A) NEGATIVE   Ketones, ur 5 (A) NEGATIVE mg/dL   Protein, ur 899 (A) NEGATIVE mg/dL   Nitrite NEGATIVE NEGATIVE   Leukocytes,Ua NEGATIVE NEGATIVE   RBC / HPF 0-5 0 - 5 RBC/hpf   WBC, UA 0-5 0 - 5 WBC/hpf   Bacteria, UA NONE SEEN NONE SEEN   Squamous Epithelial / HPF 0-5 0 - 5 /HPF   Mucus PRESENT    Hyaline Casts, UA PRESENT   Lipase, blood     Status: None   Collection Time: 07/31/24 10:59 AM  Result Value Ref Range   Lipase 41 11 - 51 U/L  Comprehensive metabolic panel     Status: Abnormal   Collection Time: 07/31/24 10:59 AM  Result Value Ref Range   Sodium 133 (L) 135 - 145 mmol/L   Potassium 3.6 3.5 - 5.1 mmol/L   Chloride 100 98 - 111 mmol/L   CO2 20 (L) 22 - 32 mmol/L   Glucose, Bld 194 (H) 70 - 99 mg/dL   BUN 12 8 - 23 mg/dL   Creatinine, Ser 8.95 0.61 - 1.24 mg/dL   Calcium  9.4 8.9 - 10.3 mg/dL   Total Protein 7.1 6.5 - 8.1 g/dL   Albumin 3.3 (L) 3.5 - 5.0  g/dL   AST 20 15 - 41 U/L   ALT 16 0 - 44 U/L   Alkaline Phosphatase 48 38 - 126 U/L   Total Bilirubin 1.3 (H) 0.0 - 1.2 mg/dL   GFR, Estimated >39 >39 mL/min   Anion gap 13 5 - 15  CBC     Status: Abnormal   Collection Time: 07/31/24 10:59 AM  Result Value Ref Range   WBC 11.6 (H) 4.0 - 10.5 K/uL   RBC 5.51 4.22 - 5.81 MIL/uL   Hemoglobin 15.8 13.0 - 17.0 g/dL   HCT  47.3 39.0 - 52.0 %   MCV 85.8 80.0 - 100.0 fL   MCH 28.7 26.0 - 34.0 pg   MCHC 33.4 30.0 - 36.0 g/dL   RDW 87.2 88.4 - 84.4 %   Platelets 282 150 - 400 K/uL   nRBC 0.0 0.0 - 0.2 %     Imaging Orders         CT ABDOMEN PELVIS W CONTRAST      Assessment and Plan   Brett Stanley is an 68 y.o. male with short segment simple sigmoid diverticulitis and a small bowel obstruction.  I reviewed labs, vitals, imaging including CT showing the above findings.  I recommend NPO status, okay for ice chips.  If he has recurrent or worsening symptoms he should have a nasogastric tube placed in the stomach to low intermittent wall suction.  He needs IV fluids and antibiotics.  I wonder if he has an omental band scarred to his colon causing this presentation.  Hopefully, as the diverticulitis improves with antibiotics, the bowel obstruction will also resolve.  Surgery team will follow.   Deward JINNY Foy, MD  [facility] Use [email or web address] to contact on call provider  New Patient Billing: [id number] - High MDM

## 2024-07-31 NOTE — H&P (Addendum)
 History and Physical    Patient: Brett Stanley FMW:[id number] DOB: 13-Jul-1956 DOA: 07/31/2024 DOS: the patient was seen and examined on 07/31/2024 PCP: [name], MD  Patient coming from: Home  Chief Complaint:  Chief Complaint  Patient presents with   Diarrhea   HPI: Isreal NASSIM Stanley is a 68 y.o. male with medical history significant of hypertension, hyperlipidemia, diabetes mellitus type 2, and GERD presents with presents with abdominal pain and bloating.  Symptoms began Tuesday( 5 days ago) at approximately 3:30 AM with indigestion, followed by diarrhea and vomiting. He experienced nonbloody diarrhea about twelve times on Tuesday and Wednesday, with a decrease in frequency by Thursday. Vomiting occurred Tuesday night, more significantly on Wednesday night, and less on Thursday night.  He visited his doctor on Wednesday and was diagnosed with viral gastroenteritis. By Friday, he felt better and was able to mow his yard. However, on Saturday, he noticed bloating and soreness in his abdomen, which worsened by the evening. He attempted to visit urgent care but found it closed, so he returned home.  He has no history of stomach surgeries.  Patient's last colonoscopy was in 06/2020 with Dr. Aneita where he was found to have had two 8 to 10 mm polyps removed in the descending and transverse colon and mild diverticulosis in the left colon with internal hemorrhoids.    He takes lisinopril  for blood pressure, which he took this morning, diabetes medication, and Ozempic , which he decided to pause for a week.  He does report a recent dose increase of his his Ozempic  but no other medication changes.  He does not smoke or consume alcohol. He underwent a sleep study two years ago and was advised he could use a CPAP if desired, but he does not use it.  His stomach is not hard today, unlike yesterday prior to him having some improvement in his abdominal pain after vomiting. He has experienced occasional  gas passage today.  Upon admission into the emergency department patient was noted to be afebrile and tachycardic with all other vital signs relatively maintained.  Labs significant for WBC 11.6, sodium 133, and glucose 194.  Abdominal x-ray noted multiple dilated small bowel loops with air-fluid levels concerning for small bowel obstruction.  Urinalysis without significant signs for infection.  CT scan abdomen pelvis reveals small bowel obstruction with transition point in the right hemiabdomen and uncomplicated acute sigmoid diverticulitis without perforation.  Review of Systems: As mentioned in the history of present illness. All other systems reviewed and are negative. Past Medical History:  Diagnosis Date   Allergy    GERD (gastroesophageal reflux disease)    HTN (hypertension)    Hyperlipidemia    Sleep apnea    improved, off cpap as of 2024.   Tubular adenoma of colon 2010   Type II or unspecified type diabetes mellitus without mention of complication, not stated as uncontrolled    Past Surgical History:  Procedure Laterality Date   COLONOSCOPY     2015   KNEE ARTHROSCOPY Right 2014   VASECTOMY  1997   Dr. Amiel   Social History:  reports that he has never smoked. He has quit using smokeless tobacco.  His smokeless tobacco use included chew. He reports current alcohol use. He reports that he does not use drugs.  Allergies  Allergen Reactions   Atorvastatin  Other (See Comments)    Joint aches   Erythromycin Nausea Only   Metformin  And Related Other (See Comments)  [place] tolerated dose is 500mg  a day.    Oxycodone Other (See Comments)    Nausea but he can tolerate hydrocodone     Family History  Problem Relation Age of Onset   Alzheimer's disease Mother    Diabetes Mother    Hypertension Other    Cancer Other        Leukemia   Sleep apnea Sister    Diabetes Maternal Uncle    Depression Maternal Uncle    Stroke Maternal Grandfather    Colon cancer Neg Hx     Prostate cancer Neg Hx    Colon polyps Neg Hx    Esophageal cancer Neg Hx    Rectal cancer Neg Hx    Stomach cancer Neg Hx     Prior to Admission medications   Medication Sig Start Date End Date Taking? Authorizing Provider  fexofenadine (ALLEGRA) 180 MG tablet Take 180 mg by mouth daily.   Yes [provider]  fluticasone (FLONASE) 50 MCG/ACT nasal spray Place 1 spray into both nostrils daily.   Yes [provider]  glipiZIDE  (GLUCOTROL ) 5 MG tablet TAKE 2 TABLETS BY MOUTH DAILY BEFORE BREAKFAST 03/04/24  Yes [name], MD  lisinopril  (ZESTRIL ) 5 MG tablet TAKE 1 TABLET BY MOUTH DAILY 03/04/24  Yes [name], MD  metFORMIN  (GLUCOPHAGE ) 500 MG tablet TAKE 1 TABLET BY MOUTH DAILY WITH BREAKFAST 03/04/24  Yes [name], MD  Semaglutide , 1 MG/DOSE, 4 MG/3ML SOPN Inject 1 mg as directed once a week. 04/18/24  Yes [name], MD  sildenafil  (REVATIO ) 20 MG tablet TAKE 5 TABLETS BY MOUTH DAILY AS NEEDED 05/25/24  Yes [name], MD  triamcinolone  cream (KENALOG ) 0.5 % Apply 1 Application topically 2 (two) times daily as needed. 07/15/22  Yes [name], MD  glucose blood [facility] VERIO) test strip Check sugar 1 time daily. E11.9 10/20/22   [name], MD    Physical Exam: Vitals:   07/31/24 1048 07/31/24 1050 07/31/24 1356  BP: 110/84  117/88  Pulse: (!) 105  83  Resp: 18  18  Temp: 97.6 F (36.4 C)  97.8 F (36.6 C)  TempSrc:   Oral  SpO2: 95%  98%  Weight:  117.9 kg   Height:  5' 9 (1.753 m)     Constitutional: Obese elderly male currently in no acute distress Eyes: PERRL, lids and conjunctivae normal ENMT: Mucous membranes are moist. Normal dentition.  Neck: normal, supple,  Respiratory: clear to auscultation bilaterally, no wheezing, no crackles. Normal respiratory effort. No accessory muscle use.  Cardiovascular: Regular rate and rhythm, no murmurs / rubs / gallops. No extremity edema. 2+ pedal pulses.  Abdomen:  Protuberant abdomen with generalized tenderness to palpation.  Decreased bowel sounds appreciated. Musculoskeletal: no clubbing / cyanosis. No joint deformity upper and lower extremities. Good ROM, no contractures. Normal muscle tone.  Skin: no rashes, lesions, ulcers. No induration Neurologic: CN 2-12 grossly intact.  Strength 5/5 in all 4.  Psychiatric: Normal judgment and insight. Alert and oriented x 3. Normal mood.   Data Reviewed:   EKG revealed normal sinus rhythm at 89 bpm with some borderline right axis deviation.  Reviewed labs, imaging, and pertinent records as documented.   Assessment and Plan:  Small bowel obstruction Acute.  Patient presents with complaints of nausea, vomiting, and diarrhea since the beginning of this week.  Diarrhea symptoms had improved and his last bowel movement was 2-3 days ago.  Patient has had  no episodes of vomiting today.  CT scan revealed small bowel obstruction with transition point in the right hemiabdomen. - Admit to a telemetry bed - Aspiration precautions with elevation head of bed - N.p.o. - May warrant nasogastric tube placement if nausea or vomiting persist - Strict I&Os - Continue ciprofloxacin  and metronidazole  - Orders to repeat check abdominal x-ray tomorrow morning - Antiemetics as needed - General Surgery consulted, will follow-up for any further recommendations  Sigmoid diverticulitis Acute.  CT scan noted uncomplicated sigmoid diverticulitis.  Patient was started on ciprofloxacin  and metronidazole . - Continue ciprofloxacin  and metronidazole  - Morphine  IV as needed for pain  Leukocytosis Acute.  WBC elevated 11.6.  Thought secondary to above. - Recheck CBC tomorrow  Hyponatremia Acute.  Sodium noted to be 133.  Secondary to reports of nausea, vomiting, and diarrhea. - Normal saline IV fluids at 75 mL/h - Recheck sodium levels in a.m.  Diabetes mellitus type 2, without long-term use of insulin  On admission glucose noted  to be 194. - Hypoglycemic protocol - Hold metformin  and glipizide   - CBGs every 6 hours with very sensitive SSI  Essential hypertension Blood pressures are within normal limits. - Hold lisinopril  - Hydralazine  IV as needed for elevated blood pressures.  Obesity, class II BMI 38.4 kg/m  DVT prophylaxis: Lovenox  Advance Care Planning:   Code Status: Full Code   Consults: General Surgery  Family Communication: Family updated at bedside  Severity of Illness: The appropriate patient status for this patient is INPATIENT. Inpatient status is judged to be reasonable and necessary in order to provide the required intensity of service to ensure the patient's safety. The patient's presenting symptoms, physical exam findings, and initial radiographic and laboratory data in the context of their chronic comorbidities is felt to place them at high risk for further clinical deterioration. Furthermore, it is not anticipated that the patient will be medically stable for discharge from the hospital within 2 midnights of admission.   * I certify that at the point of admission it is my clinical judgment that the patient will require inpatient hospital care spanning beyond 2 midnights from the point of admission due to high intensity of service, high risk for further deterioration and high frequency of surveillance required.*  Author: Maximino DELENA Sharps, MD 07/31/2024 3:11 PM  For on call review [email or web address].

## 2024-07-31 NOTE — ED Triage Notes (Signed)
 Pt c/o vomiting and diarrhea x 5 days. Seen by PCP earlier in week and went back to UC today. UC sent pt for probably small bowel obstruction.

## 2024-07-31 NOTE — ED Provider Notes (Addendum)
 [facility]    CSN: [id number] Arrival date & time: 07/31/24  9188      History   Chief Complaint Chief Complaint  Patient presents with   Abdominal Pain   Emesis    HPI Brett Stanley is a 68 y.o. male.   Discussed the use of AI scribe software for clinical note transcription with the patient, who gave verbal consent to proceed.   Patient presents with stomach issues that began on Tuesday, initially diagnosed as viral by their primary care physician on Wednesday. The patient's symptoms included nausea, vomiting, watery stools, and bloating. The patient experienced vomiting three times in total since the onset of symptoms. On Thursday morning, they had their last episode of diarrhea and felt normal by Thursday afternoon. However, last night, the patient's abdomen became tight and painful, which was relieved after vomiting once. There has been no diarrhea since Thursday, and the patient has not had a bowel movement. The patient reports feeling hungry today and is drinking fluids. He mentions experiencing indigestion and taking omeprazole. This morning, the patient has been passing gas but is apprehensive about letting anything out. The patient denies fever, chills, body aches, chest pains, or shortness of breath. The patient has no history of abdominal surgeries but is overdue for a colonoscopy. They report their current abdominal size as normal.  The following portions of the patient's history were reviewed and updated as appropriate: allergies, current medications, past family history, past medical history, past social history, past surgical history, and problem list.    Past Medical History:  Diagnosis Date   Allergy    GERD (gastroesophageal reflux disease)    HTN (hypertension)    Hyperlipidemia    Sleep apnea    improved, off cpap as of 2024.   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 Amiel       Home Medications    Prior to Admission medications   Medication Sig Start Date End Date Taking? Authorizing Provider  fexofenadine (ALLEGRA) 180 MG tablet Take 180 mg by mouth daily.    [provider]  fluticasone (FLONASE) 50 MCG/ACT nasal spray Place into both nostrils daily.    [provider]  glipiZIDE  (GLUCOTROL ) 5 MG tablet TAKE 2 TABLETS BY MOUTH DAILY BEFORE BREAKFAST 03/04/24   [name], MD  glucose blood (ONETOUCH VERIO) test strip Check sugar 1 time daily. E11.9 10/20/22   [name], MD  Ibuprofen 200 MG CAPS Take by mouth as needed.     [provider]  lisinopril  (ZESTRIL ) 5  MG tablet TAKE 1 TABLET BY MOUTH DAILY 03/04/24   [name], MD  metFORMIN  (GLUCOPHAGE ) 500 MG tablet TAKE 1 TABLET BY MOUTH DAILY WITH BREAKFAST 03/04/24   [name], MD  Semaglutide , 1 MG/DOSE, 4 MG/3ML SOPN Inject 1 mg as directed once a week. 04/18/24   [name], MD  sildenafil  (REVATIO ) 20 MG tablet TAKE 5 TABLETS BY MOUTH DAILY AS NEEDED 05/25/24   [name], MD  triamcinolone  cream (KENALOG ) 0.5 % Apply 1 Application topically 2  (two) times daily as needed. 07/15/22   [name], MD    Family History Family History  Problem Relation Age of Onset   Alzheimer's disease Mother    Diabetes Mother    Hypertension Other    Cancer Other        Leukemia   Sleep apnea Sister    Diabetes Maternal Uncle    Depression Maternal Uncle    Stroke Maternal Grandfather    Colon cancer Neg Hx    Prostate cancer Neg Hx    Colon polyps Neg Hx    Esophageal cancer Neg Hx    Rectal cancer Neg Hx    Stomach cancer Neg Hx     Social History Social History   Tobacco Use   Smoking status: Never   Smokeless tobacco: Former    Types: Engineer, drilling   Vaping status: Never Used  Substance Use Topics   Alcohol use: Yes    Alcohol/week: 0.0 standard drinks of alcohol    Comment: very rare use   Drug use: No     Allergies   Atorvastatin , Erythromycin, Metformin  and related, and Oxycodone   Review of Systems Review of Systems  Constitutional:  Positive for appetite change. Negative for fever.  Respiratory:  Negative for shortness of breath.   Cardiovascular:  Negative for chest pain.  Gastrointestinal:  Positive for abdominal distention, abdominal pain, diarrhea and vomiting.  Genitourinary:  Negative for dysuria.  Musculoskeletal:  Negative for back pain.  All other systems reviewed and are negative.    Physical Exam Triage Vital Signs ED Triage Vitals  Encounter Vitals Group     BP      Girls Systolic BP Percentile      Girls Diastolic BP Percentile      Boys Systolic BP Percentile      Boys Diastolic BP Percentile      Pulse      Resp      Temp      Temp src      SpO2      Weight      Height      Head Circumference      Peak Flow      Pain Score      Pain Loc      Pain Education      Exclude from Growth Chart    No data found.  Updated Vital Signs BP 118/80 (BP Location: Left Arm)   Pulse 98   Temp 97.9 F (36.6 C) (Oral)   Resp 20   SpO2 96%   Visual Acuity Right Eye  Distance:   Left Eye Distance:   Bilateral Distance:    Right Eye Near:   Left Eye Near:    Bilateral Near:     Physical Exam Vitals reviewed.  Constitutional:      General: He is awake. He is not in acute distress.    Appearance: Normal appearance. He  is well-developed. He is not ill-appearing, toxic-appearing or diaphoretic.  HENT:     Head: Normocephalic.     Right Ear: Hearing normal.     Left Ear: Hearing normal.     Nose: Nose normal.     Mouth/Throat:     Mouth: Mucous membranes are moist.  Eyes:     General: Vision grossly intact.     Conjunctiva/sclera: Conjunctivae normal.  Cardiovascular:     Rate and Rhythm: Normal rate and regular rhythm.     Heart sounds: Normal heart sounds.  Pulmonary:     Effort: Pulmonary effort is normal.     Breath sounds: Normal breath sounds and air entry.  Abdominal:     General: Bowel sounds are decreased. There is no distension.     Tenderness: There is generalized abdominal tenderness (mild). There is no guarding or rebound.     Comments: Abdomen is firm but nondistended   Musculoskeletal:        General: Normal range of motion.     Cervical back: Full passive range of motion without pain, normal range of motion and neck supple.  Skin:    General: Skin is warm and dry.  Neurological:     General: No focal deficit present.     Mental Status: He is alert and oriented to person, place, and time.  Psychiatric:        Mood and Affect: Mood normal.        Speech: Speech normal.        Behavior: Behavior normal. Behavior is cooperative.        Thought Content: Thought content normal.        Judgment: Judgment normal.      UC Treatments / Results  Labs (all labs ordered are listed, but only abnormal results are displayed) Labs Reviewed  POCT URINE DIPSTICK - Abnormal; Notable for the following components:      Result Value   Color, UA orange (*)    Glucose, UA =250 (*)    Bilirubin, UA moderate (*)    Ketones, POC UA trace  (5) (*)    Spec Grav, UA >=1.030 (*)    POC PROTEIN,UA =100 (*)    All other components within normal limits    EKG   Radiology DG Abd 1 View Result Date: 07/31/2024 EXAM: 1 VIEW XRAY OF THE ABDOMEN 07/31/2024 09:18:29 AM COMPARISON: None available. CLINICAL HISTORY: Acute abdominal pain, distension, nausea, vomiting. Pt st's he had nausea vomiting and diarrhea onset 5 days ago. Was seen by his doctor 4 days ago. Pt st's he started feeling better but nausea, bloating and abd pain returned yesterday. Pt st's after vomiting large amount last pm he felt better. Hypo-active bowel sounds in all 4 quads. FINDINGS: BOWEL: There are multiple dilated small bowel loops within the upper abdomen with air-fluid levels. These measure up to 5.1 cm in diameter. There is moderate retained stool in the right colon and rectum. SOFT TISSUES: No abnormal calcifications. No opaque urinary calculi. BONES: No acute osseous abnormality. IMPRESSION: 1. Multiple dilated small bowel loops with air-fluid levels, measuring up to 5.1 cm in diameter, consistent with small bowel obstruction. 2. Moderate retained stool in the right colon and rectum. 3. The urgent finding will be called to the ordering provider by the Professional Radiology Assistants (PRAs) and documented in the [facility] dashboard. Electronically signed by: Waddell Calk MD 07/31/2024 09:30 AM EDT RP Workstation: [id number]    Procedures Procedures (including critical care time)  Medications Ordered in UC Medications - No data to display  Initial Impression / Assessment and Plan / UC Course  I have reviewed the triage vital signs and the nursing notes.  Pertinent labs & imaging results that were available during my care of the patient were reviewed by me and considered in my medical decision making (see chart for details).   The patient presented with a several-day history of gastroenteritis symptoms beginning Tuesday, including nausea, three episodes of  vomiting, and watery stools lasting a couple of days. Symptoms peaked last night with abdominal tightness and pain, which improved after vomiting. He reports no further diarrhea since Thursday. On exam, he is afebrile, nontoxic in appearance, with minimal abdominal tenderness, very little bowel sounds, and a firm but nondistended abdomen. Urinalysis revealed moderate bilirubin, trace ketones, glucose, and protein, with no blood, nitrites, or leukocytes. A one-view abdominal x-ray demonstrated multiple dilated small bowel loops in the upper abdomen with air-fluid levels up to 5.1 cm, along with a moderate amount of retained stool in the right colon and rectum. Given the patient's symptoms, UA, and imaging findings, the presentation is consistent with a small bowel obstruction, although CT remains the gold standard for diagnosis. The patient was advised to proceed immediately to the emergency department for further evaluation and treatment. He is clinically stable for transport by private vehicle, and his wife is present to drive. He was instructed to remain n.p.o. until cleared in the ED. The patient is agreeable to the plan.  The patient presents with symptoms and exam findings that warrant a higher level of care and further evaluation. Given the clinical picture, emergency department (ED) assessment is recommended for advanced diagnostics and management. The patient is currently stable and appropriate for transport via private vehicle. A responsible adult will be driving. The patient is alert, oriented, demonstrates clear mental status, good insight into their illness, and sound judgment in making decisions regarding their care. The risks, rationale, and need for ED evaluation were discussed, and the patient is agreeable to the plan.  Documentation was completed with the aid of voice recognition software. Transcription may contain typographical errors.  Final Clinical Impressions(s) / UC Diagnoses   Final  diagnoses:  Generalized abdominal pain  Small bowel obstruction [facility])     Discharge Instructions      You were evaluated today for abdominal pain, nausea, and vomiting. Your exam, urine test, and abdominal x-ray show findings most consistent with a small bowel obstruction. This condition requires immediate evaluation and treatment in the emergency department. You are stable to go by private vehicle, and your wife will drive you. Until you are evaluated further, do not eat or drink anything. At the hospital, additional testing such as a CT scan will likely be performed to confirm the diagnosis and guide treatment. If your pain suddenly worsens, if you begin to vomit repeatedly, if you develop fever, dizziness, fainting, or any new concerning symptoms on the way, have your wife pull over immediately and call 911.     ED Prescriptions   None    PDMP not reviewed this encounter.   Iola Lukes, FNP 07/31/24 1003    Iola Laurium, [place] 07/31/24 1027

## 2024-07-31 NOTE — ED Notes (Signed)
 Patient is being discharged from the Urgent Care and sent to the Emergency Department via Private Vehicle . Per Provider, patient is in need of higher level of care due to SBO. Patient is aware and verbalizes understanding of plan of care.  Vitals:   07/31/24 0855  BP: 118/80  Pulse: 98  Resp: 20  Temp: 97.9 F (36.6 C)  SpO2: 96%

## 2024-07-31 NOTE — ED Provider Notes (Signed)
 [place] EMERGENCY DEPARTMENT AT [facility] Provider Note   CSN: [id number] Arrival date & time: 07/31/24  1043     Patient presents with: Diarrhea   Brett Stanley is a 68 y.o. male.   HPI Patient presents with concern of abdominal pain, nausea, vomiting, anorexia. Patient had illness beginning 5 days ago, initially with diarrhea, nausea, anorexia.  He had several episodes of vomiting, had a transient period of improvement, but since yesterday has had several episodes of vomiting, no bowel movements, only occasional flatus. Abdominal pain is upper, severe occasionally, currently mild.  He went urgent care and was sent here for evaluation.    Prior to Admission medications   Medication Sig Start Date End Date Taking? Authorizing Provider  fexofenadine (ALLEGRA) 180 MG tablet Take 180 mg by mouth daily.   Yes [provider]  fluticasone (FLONASE) 50 MCG/ACT nasal spray Place 1 spray into both nostrils daily.   Yes [provider]  glipiZIDE  (GLUCOTROL ) 5 MG tablet TAKE 2 TABLETS BY MOUTH DAILY BEFORE BREAKFAST 03/04/24  Yes [name], MD  lisinopril  (ZESTRIL ) 5 MG tablet TAKE 1 TABLET BY MOUTH DAILY 03/04/24  Yes [name], MD  metFORMIN  (GLUCOPHAGE ) 500 MG tablet TAKE 1 TABLET BY MOUTH DAILY WITH BREAKFAST 03/04/24  Yes [name], MD  Semaglutide , 1 MG/DOSE, 4 MG/3ML SOPN Inject 1 mg as directed once a week. 04/18/24  Yes [name], MD  sildenafil  (REVATIO ) 20 MG tablet TAKE 5 TABLETS BY MOUTH DAILY AS NEEDED 05/25/24  Yes [name], MD  triamcinolone  cream (KENALOG ) 0.5 % Apply 1 Application topically 2 (two) times daily as needed. 07/15/22  Yes [name], MD  glucose blood [facility] VERIO) test strip Check sugar 1 time daily. E11.9 10/20/22   [name], MD    Allergies: Atorvastatin , Erythromycin, Metformin  and related, and Oxycodone    Review of Systems  Updated Vital Signs BP 117/88   Pulse 83   Temp  97.8 F (36.6 C) (Oral)   Resp 18   Ht 5' 9 (1.753 m)   Wt 117.9 kg   SpO2 98%   BMI 38.40 kg/m   Physical Exam Vitals and nursing note reviewed.  Constitutional:      General: He is not in acute distress.    Appearance: He is well-developed. He is obese.  HENT:     Head: Normocephalic and atraumatic.  Eyes:     Conjunctiva/sclera: Conjunctivae normal.  Cardiovascular:     Rate and Rhythm: Regular rhythm. Tachycardia present.  Pulmonary:     Effort: Pulmonary effort is normal. No respiratory distress.     Breath sounds: No stridor.  Abdominal:     General: There is no distension.     Tenderness: There is abdominal tenderness.  Skin:    General: Skin is warm and dry.  Neurological:     Mental Status: He is alert and oriented to person, place, and time.     (all labs ordered are listed, but only abnormal results are displayed) Labs Reviewed  COMPREHENSIVE METABOLIC PANEL WITH GFR - Abnormal; Notable for the following components:      Result Value   Sodium 133 (*)    CO2 20 (*)    Glucose, Bld 194 (*)    Albumin 3.3 (*)    Total Bilirubin 1.3 (*)    All other components within normal limits  CBC - Abnormal; Notable for the following components:   WBC  11.6 (*)    All other components within normal limits  URINALYSIS, ROUTINE W REFLEX MICROSCOPIC - Abnormal; Notable for the following components:   Color, Urine AMBER (*)    APPearance HAZY (*)    Specific Gravity, Urine 1.035 (*)    Glucose, UA >=500 (*)    Bilirubin Urine SMALL (*)    Ketones, ur 5 (*)    Protein, ur 100 (*)    All other components within normal limits  LIPASE, BLOOD    EKG: None  Radiology: CT ABDOMEN PELVIS W CONTRAST Result Date: 07/31/2024 EXAM: CT ABDOMEN AND PELVIS WITH CONTRAST 07/31/2024 01:01:31 PM TECHNIQUE: CT of the abdomen and pelvis was performed with the administration of intravenous contrast. Multiplanar reformatted images are provided for review. Automated exposure control,  iterative reconstruction, and/or weight based adjustment of the mA/kV was utilized to reduce the radiation dose to as low as reasonably achievable. COMPARISON: None available. CLINICAL HISTORY: Bowel obstruction suspected. Pt c/o vomiting and diarrhea x 5 days. Seen by PCP earlier in week and went back to UC today. UC sent pt for probably small bowel obstruction. FINDINGS: LOWER CHEST: No acute abnormality. LIVER: The liver is unremarkable. GALLBLADDER AND BILE DUCTS: Gallbladder is unremarkable. No biliary ductal dilatation. SPLEEN: No acute abnormality. PANCREAS: No acute abnormality. ADRENAL GLANDS: No acute abnormality. KIDNEYS, URETERS AND BLADDER: Bilateral Bosniak class 1 and 2 kidney cysts. The largest arises off the posterior cortex of the right kidney measuring 3.1 cm. No stones in the kidneys or ureters. No hydronephrosis. No perinephric or periureteral stranding. Urinary bladder is unremarkable. GI AND BOWEL: Multiple dilated loops of small bowel are noted measuring up to 4.2 cm in diameter. Transitioned to decreased caliber distal small bowel identified within the right hemi coronal image 85/6. Normal caliber terminal ileum. Moderate retained stool identified within the ascending colon. Sigmoid diverticulosis. Focally inflamed sigmoid diverticula noted within the left lower quadrant/3. PERITONEUM AND RETROPERITONEUM: Small volume of free fluid noted within the pelvis. No free air. VASCULATURE: Aortic atherosclerosis. LYMPH NODES: No signs of abdominopelvic adenopathy. REPRODUCTIVE ORGANS: Prostate gland enlargement with mass effect upon the base of bladder. BONES AND SOFT TISSUES: Degenerative changes noted within the lumbar spine. No acute or suspicious bone abnormality. IMPRESSION: 1. Small bowel obstruction with transition point in the right hemiabdomen. 2. Uncomplicated acute sigmoid diverticulitis. No perforation or abscess. 3. Prostate gland enlargement with mass effect upon the base of bladder.  4. Aortic atherosclerotic calcification. Electronically signed by: Waddell Calk MD 07/31/2024 01:29 PM EDT RP Workstation: [id number]   DG Abd 1 View Result Date: 07/31/2024 EXAM: 1 VIEW XRAY OF THE ABDOMEN 07/31/2024 09:18:29 AM COMPARISON: None available. CLINICAL HISTORY: Acute abdominal pain, distension, nausea, vomiting. Pt st's he had nausea vomiting and diarrhea onset 5 days ago. Was seen by his doctor 4 days ago. Pt st's he started feeling better but nausea, bloating and abd pain returned yesterday. Pt st's after vomiting large amount last pm he felt better. Hypo-active bowel sounds in all 4 quads. FINDINGS: BOWEL: There are multiple dilated small bowel loops within the upper abdomen with air-fluid levels. These measure up to 5.1 cm in diameter. There is moderate retained stool in the right colon and rectum. SOFT TISSUES: No abnormal calcifications. No opaque urinary calculi. BONES: No acute osseous abnormality. IMPRESSION: 1. Multiple dilated small bowel loops with air-fluid levels, measuring up to 5.1 cm in diameter, consistent with small bowel obstruction. 2. Moderate retained stool in the right colon and rectum. 3. The  urgent finding will be called to the ordering provider by the Professional Radiology Assistants (PRAs) and documented in the [facility] dashboard. Electronically signed by: Waddell Calk MD 07/31/2024 09:30 AM EDT RP Workstation: [id number]     Procedures   Medications Ordered in the ED  ciprofloxacin  (CIPRO ) IVPB 400 mg (has no administration in time range)  metroNIDAZOLE  (FLAGYL ) IVPB 500 mg (has no administration in time range)  iohexol  (OMNIPAQUE ) 350 MG/ML injection 75 mL (75 mLs Intravenous Contrast Given 07/31/24 1251)                                    Medical Decision Making Elderly male without history of abdominal surgery presents with several days of nausea, vomiting, anorexia and prior diarrhea now with no bowel movements. Concern for viral process, subsequent  electrolyte abnormalities versus obstruction, infection intra-abdominal. Cardiac 95-105 borderline Pulse ox 100% room air normal  Amount and/or Complexity of Data Reviewed Independent Historian:     Details: Family members at bedside External Data Reviewed: notes.    Details: Urgent care notes Labs: ordered. Decision-making details documented in ED Course. Radiology: ordered and independent interpretation performed. Decision-making details documented in ED Course.  Risk Prescription drug management. Decision regarding hospitalization.   2:26 PM Patient, family aware of CT results, labs, findings concerning for both small bowel obstruction and diverticulitis.  Patient starting antibiotics, surgery aware patient will be admitted to the internal medicine team.  No evidence of bacteremia, sepsis, the patient has not had emesis today, NG tube on hold given the symptoms have generally improved.    Final diagnoses:  Diverticulitis  SBO (small bowel obstruction) [facility])    ED Discharge Orders     None          Garrick Charleston, MD 07/31/24 1427

## 2024-07-31 NOTE — ED Triage Notes (Addendum)
 Pt st's he had nausea vomiting and  diarrhea onset 5 days ago   Was seen by his doctor 4 days ago  Pt st's he started feeling better but nausea, bloating and abd pain returned yesterday.  Pt st's after vomiting large amount last pm he felt better  Hypo-active bowel sounds in all 4 quads

## 2024-08-01 ENCOUNTER — Other Ambulatory Visit

## 2024-08-01 ENCOUNTER — Inpatient Hospital Stay (HOSPITAL_COMMUNITY)

## 2024-08-01 DIAGNOSIS — D72825 Bandemia: Secondary | ICD-10-CM

## 2024-08-01 DIAGNOSIS — K5732 Diverticulitis of large intestine without perforation or abscess without bleeding: Secondary | ICD-10-CM | POA: Diagnosis not present

## 2024-08-01 DIAGNOSIS — E66812 Obesity, class 2: Secondary | ICD-10-CM | POA: Diagnosis not present

## 2024-08-01 DIAGNOSIS — K56609 Unspecified intestinal obstruction, unspecified as to partial versus complete obstruction: Secondary | ICD-10-CM | POA: Diagnosis not present

## 2024-08-01 LAB — CBC
HCT: 41.4 % (ref 39.0–52.0)
Hemoglobin: 14 g/dL (ref 13.0–17.0)
MCH: 29.2 pg (ref 26.0–34.0)
MCHC: 33.8 g/dL (ref 30.0–36.0)
MCV: 86.3 fL (ref 80.0–100.0)
Platelets: 224 K/uL (ref 150–400)
RBC: 4.8 MIL/uL (ref 4.22–5.81)
RDW: 12.8 % (ref 11.5–15.5)
WBC: 9.9 K/uL (ref 4.0–10.5)
nRBC: 0 % (ref 0.0–0.2)

## 2024-08-01 LAB — COMPREHENSIVE METABOLIC PANEL WITH GFR
ALT: 15 U/L (ref 0–44)
AST: 14 U/L — ABNORMAL LOW (ref 15–41)
Albumin: 2.7 g/dL — ABNORMAL LOW (ref 3.5–5.0)
Alkaline Phosphatase: 37 U/L — ABNORMAL LOW (ref 38–126)
Anion gap: 8 (ref 5–15)
BUN: 12 mg/dL (ref 8–23)
CO2: 23 mmol/L (ref 22–32)
Calcium: 8.5 mg/dL — ABNORMAL LOW (ref 8.9–10.3)
Chloride: 104 mmol/L (ref 98–111)
Creatinine, Ser: 0.84 mg/dL (ref 0.61–1.24)
GFR, Estimated: >60 mL/min (ref >60)
Glucose, Bld: 90 mg/dL (ref 70–99)
Potassium: 3.3 mmol/L — ABNORMAL LOW (ref 3.5–5.1)
Sodium: 135 mmol/L (ref 135–145)
Total Bilirubin: 1 mg/dL (ref 0.0–1.2)
Total Protein: 5.9 g/dL — ABNORMAL LOW (ref 6.5–8.1)

## 2024-08-01 LAB — GLUCOSE, CAPILLARY
Glucose-Capillary: 110 mg/dL — ABNORMAL HIGH (ref 70–99)
Glucose-Capillary: 146 mg/dL — ABNORMAL HIGH (ref 70–99)
Glucose-Capillary: 81 mg/dL (ref 70–99)
Glucose-Capillary: 95 mg/dL (ref 70–99)

## 2024-08-01 LAB — MAGNESIUM: Magnesium: 1.8 mg/dL (ref 1.7–2.4)

## 2024-08-01 MED ORDER — POTASSIUM CHLORIDE IN NACL 20-0.9 MEQ/L-% IV SOLN
INTRAVENOUS | Status: AC
  Filled 2024-08-01 (×3): qty 1000

## 2024-08-01 MED ORDER — ORAL CARE MOUTH RINSE: 15.0000 mL | OROMUCOSAL | Status: DC | PRN

## 2024-08-01 MED ORDER — INSULIN ASPART 100 UNIT/ML IJ SOLN
0.0000 [IU] | Freq: Three times a day (TID) | INTRAMUSCULAR | Status: DC
  Administered 2024-08-02 (×2): 1 [IU] via SUBCUTANEOUS

## 2024-08-01 MED ORDER — POTASSIUM CHLORIDE 10 MEQ/100ML IV SOLN
10.0000 meq | INTRAVENOUS | Status: AC
  Administered 2024-08-01 (×4): 10 meq via INTRAVENOUS
  Filled 2024-08-01 (×2): qty 100

## 2024-08-01 NOTE — Plan of Care (Signed)

## 2024-08-01 NOTE — Plan of Care (Signed)
 Pt sitting up in gas chair, rocking, using IS and ambulating independently. No nausea/vomiting at present.

## 2024-08-01 NOTE — Progress Notes (Signed)
 Instructed pt on IS x 10 while awake, demonstrated correct technique. Pt sitting up in the gas chair rocking. Reviewed plan of care, pt advanced to clears.

## 2024-08-01 NOTE — Progress Notes (Signed)
 Pt has tolerated his clear liquid diet well today, without nausea or vomiting. He sat in the gas chair almost all day. Abd xray shows partial obstruction. Pt using IS and ambulating.

## 2024-08-01 NOTE — Progress Notes (Addendum)
 Subjective: CC: Abdominal pain resolved. No further n/v. Passing flatus. No BM.   No prior SBO. No prior abdominal surgeries.  Denies hx of Diverticulitis in the past.  Denies personal or family hx of IBD or colon CA.  Last Colonoscopy was in 2021 and showed 2 polyps (descending colon and transverse colon - path w/ tubular adenomas), diverticula were found in the left colon and internal hemorrhoids.   Objective: Vital signs in last 24 hours: Temp:  [97.6 F (36.4 C)-98.4 F (36.9 C)] 98.3 F (36.8 C) (08/18 0437) Pulse Rate:  [68-105] 68 (08/18 0437) Resp:  [16-20] 18 (08/18 0437) BP: (106-118)/(62-88) 107/69 (08/18 0437) SpO2:  [95 %-98 %] 96 % (08/18 0437) Weight:  [117.9 kg] 117.9 kg (08/17 1050) Last BM Date : 07/28/24  Intake/Output from previous day: 08/17 0701 - 08/18 0700 In: 1213.3 [I.V.:813.3; IV Piggyback:400] Out: 250 [Urine:250] Intake/Output this shift: No intake/output data recorded.  PE: Gen:  Alert, NAD, pleasant Abd: Soft, protuberant, mild supraumbilical abdominal ttp that is mild and without rigidity or guarding.   Lab Results:  Recent Labs    07/31/24 1059 08/01/24 0448  WBC 11.6* 9.9  HGB 15.8 14.0  HCT 47.3 41.4  PLT 282 224   BMET Recent Labs    07/31/24 1059 08/01/24 0448  NA 133* 135  K 3.6 3.3*  CL 100 104  CO2 20* 23  GLUCOSE 194* 90  BUN 12 12  CREATININE 1.04 0.84  CALCIUM  9.4 8.5*   PT/INR No results for input(s): LABPROT, INR in the last 72 hours. CMP     Component Value Date/Time   NA 135 08/01/2024 0448   K 3.3 (L) 08/01/2024 0448   CL 104 08/01/2024 0448   CO2 23 08/01/2024 0448   GLUCOSE 90 08/01/2024 0448   BUN 12 08/01/2024 0448   CREATININE 0.84 08/01/2024 0448   CALCIUM  8.5 (L) 08/01/2024 0448   PROT 5.9 (L) 08/01/2024 0448   ALBUMIN 2.7 (L) 08/01/2024 0448   AST 14 (L) 08/01/2024 0448   ALT 15 08/01/2024 0448   ALKPHOS 37 (L) 08/01/2024 0448   BILITOT 1.0 08/01/2024 0448   GFRNONAA >60  08/01/2024 0448   GFRAA 114 02/13/2009 0935   Lipase     Component Value Date/Time   LIPASE 41 07/31/2024 1059    Studies/Results: CT ABDOMEN PELVIS W CONTRAST Result Date: 07/31/2024 EXAM: CT ABDOMEN AND PELVIS WITH CONTRAST 07/31/2024 01:01:31 PM TECHNIQUE: CT of the abdomen and pelvis was performed with the administration of intravenous contrast. Multiplanar reformatted images are provided for review. Automated exposure control, iterative reconstruction, and/or weight based adjustment of the mA/kV was utilized to reduce the radiation dose to as low as reasonably achievable. COMPARISON: None available. CLINICAL HISTORY: Bowel obstruction suspected. Pt c/o vomiting and diarrhea x 5 days. Seen by PCP earlier in week and went back to UC today. UC sent pt for probably small bowel obstruction. FINDINGS: LOWER CHEST: No acute abnormality. LIVER: The liver is unremarkable. GALLBLADDER AND BILE DUCTS: Gallbladder is unremarkable. No biliary ductal dilatation. SPLEEN: No acute abnormality. PANCREAS: No acute abnormality. ADRENAL GLANDS: No acute abnormality. KIDNEYS, URETERS AND BLADDER: Bilateral Bosniak class 1 and 2 kidney cysts. The largest arises off the posterior cortex of the right kidney measuring 3.1 cm. No stones in the kidneys or ureters. No hydronephrosis. No perinephric or periureteral stranding. Urinary bladder is unremarkable. GI AND BOWEL: Multiple dilated loops of small bowel are noted measuring up to 4.2  cm in diameter. Transitioned to decreased caliber distal small bowel identified within the right hemi coronal image 85/6. Normal caliber terminal ileum. Moderate retained stool identified within the ascending colon. Sigmoid diverticulosis. Focally inflamed sigmoid diverticula noted within the left lower quadrant/3. PERITONEUM AND RETROPERITONEUM: Small volume of free fluid noted within the pelvis. No free air. VASCULATURE: Aortic atherosclerosis. LYMPH NODES: No signs of abdominopelvic  adenopathy. REPRODUCTIVE ORGANS: Prostate gland enlargement with mass effect upon the base of bladder. BONES AND SOFT TISSUES: Degenerative changes noted within the lumbar spine. No acute or suspicious bone abnormality. IMPRESSION: 1. Small bowel obstruction with transition point in the right hemiabdomen. 2. Uncomplicated acute sigmoid diverticulitis. No perforation or abscess. 3. Prostate gland enlargement with mass effect upon the base of bladder. 4. Aortic atherosclerotic calcification. Electronically signed by: Waddell Calk MD 07/31/2024 01:29 PM EDT RP Workstation: [id number]   DG Abd 1 View Result Date: 07/31/2024 EXAM: 1 VIEW XRAY OF THE ABDOMEN 07/31/2024 09:18:29 AM COMPARISON: None available. CLINICAL HISTORY: Acute abdominal pain, distension, nausea, vomiting. Pt st's he had nausea vomiting and diarrhea onset 5 days ago. Was seen by his doctor 4 days ago. Pt st's he started feeling better but nausea, bloating and abd pain returned yesterday. Pt st's after vomiting large amount last pm he felt better. Hypo-active bowel sounds in all 4 quads. FINDINGS: BOWEL: There are multiple dilated small bowel loops within the upper abdomen with air-fluid levels. These measure up to 5.1 cm in diameter. There is moderate retained stool in the right colon and rectum. SOFT TISSUES: No abnormal calcifications. No opaque urinary calculi. BONES: No acute osseous abnormality. IMPRESSION: 1. Multiple dilated small bowel loops with air-fluid levels, measuring up to 5.1 cm in diameter, consistent with small bowel obstruction. 2. Moderate retained stool in the right colon and rectum. 3. The urgent finding will be called to the ordering provider by the Professional Radiology Assistants (PRAs) and documented in the [facility] dashboard. Electronically signed by: Waddell Calk MD 07/31/2024 09:30 AM EDT RP Workstation: [id number]    Anti-infectives: Anti-infectives (From admission, onward)    Start     Dose/Rate Route Frequency  Ordered Stop   08/01/24 0300  ciprofloxacin  (CIPRO ) IVPB 400 mg        400 mg 200 mL/hr over 60 Minutes Intravenous Every 12 hours 07/31/24 1545     08/01/24 0300  metroNIDAZOLE  (FLAGYL ) IVPB 500 mg        500 mg 100 mL/hr over 60 Minutes Intravenous Every 12 hours 07/31/24 1545     07/31/24 1400  ciprofloxacin  (CIPRO ) IVPB 400 mg        400 mg 200 mL/hr over 60 Minutes Intravenous  Once 07/31/24 1347 07/31/24 1620   07/31/24 1400  metroNIDAZOLE  (FLAGYL ) IVPB 500 mg        500 mg 100 mL/hr over 60 Minutes Intravenous  Once 07/31/24 1347 07/31/24 1547        Assessment/Plan Vi L Hanna is an 68 y.o. male with short segment simple sigmoid diverticulitis and a small bowel obstruction.  - No indication for emergency surgery - No prior abdominal surgeries. Agree with Dr. Lyndel that question if an omental band scarred to his colon causing this presentation. Hopefully as his diverticulitis improves, his SBO will also improve. - Afebrile. Tachycardia resolved. No hypotension. WBC wnl - Cont IV abx - Adv to CLD - Hopefully improves with conservative management. If fails to improve, may need OR during admission. If improves with conservative management, would recommend follow  up with GI to consider colonoscopy in 6 weeks given this is his first episode of diverticulitis  FEN - CLD, IVF per primary  VTE - SCDs, Lovenox  ID - Cipro /Flagyl  (I do not see a PCN allergy listed in his chart, consider switching to Rocephin /Flagyl )  HTN HLD DM OSA  I reviewed nursing notes, hospitalist notes, last 24 h vitals and pain scores, last 48 h intake and output, last 24 h labs and trends, and last 24 h imaging results.    LOS: 1 day    Ozell CHRISTELLA Shaper, [facility] 08/01/2024, 8:25 AM Please see Amion for pager number during day hours 7:00am-4:30pm

## 2024-08-01 NOTE — TOC Initial Note (Signed)
 Transition of Care [facility]) - Initial/Assessment Note    Patient Details  Name: Brett Stanley MRN: [id number] Date of Birth: 05/31/1956  Transition of Care [facility]) CM/SW Contact:    Jeoffrey LITTIE Moose, [place] Phone Number: 08/01/2024, 9:25 AM  Clinical Narrative:                 Pt admitted from home due to nausea, vomiting and diarrhea. No current TOC needs please consult as needs arise.        Patient Goals and CMS Choice            Expected Discharge Plan and Services                                              Prior Living Arrangements/Services                       Activities of Daily Living      Permission Sought/Granted                  Emotional Assessment              Admission diagnosis:  Diverticulitis [K57.92] SBO (small bowel obstruction) (HCC) [K56.609] Patient Active Problem List   Diagnosis Date Noted   SBO (small bowel obstruction) (HCC) 07/31/2024   Sigmoid diverticulitis 07/31/2024   Leukocytosis 07/31/2024   Hyponatremia 07/31/2024   Obesity, Class II, BMI 35-39.9 07/31/2024   Diarrhea 07/27/2024   Cellulitis of toe of right foot 06/10/2024   PSA elevation 04/20/2024   Healthcare maintenance 04/20/2024   Statin myopathy 07/25/2020   Sleep apnea 01/15/2020   Tinnitus of both ears 08/30/2018   Dysphagia 08/25/2018   Hearing loss 08/25/2018   Tick bite 09/02/2017   GERD (gastroesophageal reflux disease) 08/07/2017   Advance care planning 02/26/2015   Knee pain 03/07/2013   Welcome to Medicare preventive visit 11/16/2012   Essential hypertension 02/19/2009   Controlled type 2 diabetes mellitus without complication, without long-term current use of insulin  (HCC) 06/22/2007   HYPERCHOLESTEROLEMIA 06/15/2007   Allergic rhinitis 06/15/2007   PCP:  [name], MD Pharmacy:   [facility] [phone] - [address] [place] - [address] [address] Phone: [phone] Fax:  [phone]     Social Drivers of Health (SDOH) Social History: SDOH Screenings   Food Insecurity: No Food Insecurity (08/01/2024)  Housing: Unknown (08/01/2024)  Transportation Needs: No Transportation Needs (08/01/2024)  Utilities: Not At Risk (08/01/2024)  Depression (PHQ2-9): Low Risk  (07/27/2024)  Social Connections: Unknown (08/01/2024)  Tobacco Use: Medium Risk (07/31/2024)   SDOH Interventions:     Readmission Risk Interventions     No data to display

## 2024-08-01 NOTE — Progress Notes (Signed)
 PROGRESS NOTE  Brett Stanley FMW:[id number] DOB: [birth year]   PCP: [name], MD  Patient is from: Home.  Lives with his wife.  Independently ambulates at baseline.  DOA: 07/31/2024 LOS: 1  Chief complaints Chief Complaint  Patient presents with   Diarrhea     Brief Narrative / Interim history: 68 year old M with PMH of OSA not on CPAP, DM-2, HTN and morbid obesity presenting with abdominal pain and distention for about 5 days, and admitted with small bowel obstruction and sigmoid diverticulitis.  Recently diagnosed with viral gastroenteritis after he presented there with abdominal discomfort, diarrhea and vomiting.  No prior abdominal surgery.  Colonoscopy in 2021 with two 8 to 10 mm polyps.  CT abdomen and pelvis showed SBO with transition point in right hemiabdomen and uncomplicated acute sigmoid diverticulitis without perforation.  Patient was started on IV antibiotics.  Surgery consulted and following.  He did not require NG tube  Subjective: Seen and examined earlier this morning.  No major events overnight or this morning.  Reports passing gas yesterday evening.  No bowel movement yet.  Denies nausea or vomiting.  Denies abdominal pain or tenderness.  Objective: Vitals:   07/31/24 2019 08/01/24 0025 08/01/24 0437 08/01/24 0853  BP: 106/65 116/62 107/69 105/63  Pulse: 74 68 68 77  Resp: 16 17 18 16   Temp: 98.4 F (36.9 C) 98 F (36.7 C) 98.3 F (36.8 C) 98.2 F (36.8 C)  TempSrc: Oral Oral Oral Oral  SpO2: 96% 95% 96% 94%  Weight:      Height:        Examination:  GENERAL: No apparent distress.  Nontoxic. HEENT: MMM.  Vision and hearing grossly intact.  NECK: Supple.  No apparent JVD.  RESP:  No IWOB.  Fair aeration bilaterally. CVS:  RRR. Heart sounds normal.  ABD/GI/GU: BS+. Abd soft, NTND.  MSK/EXT:  Moves extremities. No apparent deformity. No edema.  SKIN: no apparent skin lesion or wound NEURO: AA.  Oriented appropriately.  No apparent focal neuro  deficit. PSYCH: Calm. Normal affect.   Consultants:  General surgery  Procedures: None  Microbiology summarized: None  Assessment and plan: Small bowel obstruction: Presents with abdominal pain and distention preceded by nausea, vomiting and diarrhea earlier in the week.  CT showed SBO with transition point in right hemiabdomen.  Reports flatus yesterday evening.  Denies pain, nausea or vomiting.  Nontender. -Appreciate help by general surgery-CLD, IVF -Advised patient to ambulate as often as possible.  Acute sigmoid diverticulitis: Nontender.  Leukocytosis resolved. -Continue Cipro  and Flagyl   NIDDM-2 with hyperglycemia: A1c 7.9% in 03/2024.  On metformin , glipizide  and Ozempic . Recent Labs  Lab 07/31/24 1745 08/01/24 0020 08/01/24 0605  GLUCAP 111* 81 95  -Hold home meds -Continue SSI-very sensitive.  Changed to ACHS.  Essential hypertension: Normotensive -Continue holding home lisinopril   Hyponatremia: Resolved  Leukocytosis: Likely due to diverticulitis.  Resolved  History of OSA: Not on CPAP  Hypokalemia -Monitor replenish K and Mg as appropriate   Morbid obesity: Elevated BMI with diabetes and hypertension. Body mass index is 38.4 kg/m.          DVT prophylaxis:  enoxaparin  (LOVENOX ) injection 40 mg Start: 08/01/24 1000  Code Status: Full code Family Communication: None at bedside Level of care: Telemetry Medical Status is: Inpatient Remains inpatient appropriate because: SBO and acute diverticulitis   Final disposition: Home   55 minutes with more than 50% spent in reviewing records, counseling patient/family and coordinating care.   Sch  Meds:  Scheduled Meds:  enoxaparin  (LOVENOX ) injection  40 mg Subcutaneous Q24H   insulin  aspart  0-6 Units Subcutaneous TID WC   sodium chloride  flush  3 mL Intravenous Q12H   Continuous Infusions:  0.9 % NaCl with KCl 20 mEq / L 100 mL/hr at 08/01/24 1042   ciprofloxacin  400 mg (08/01/24 0432)    metronidazole  500 mg (08/01/24 0257)   potassium chloride  10 mEq (08/01/24 1045)   PRN Meds:.acetaminophen  **OR** acetaminophen , albuterol , hydrALAZINE , morphine  injection, ondansetron  **OR** ondansetron  (ZOFRAN ) IV, mouth rinse  Antimicrobials: Anti-infectives (From admission, onward)    Start     Dose/Rate Route Frequency Ordered Stop   08/01/24 0300  ciprofloxacin  (CIPRO ) IVPB 400 mg        400 mg 200 mL/hr over 60 Minutes Intravenous Every 12 hours 07/31/24 1545     08/01/24 0300  metroNIDAZOLE  (FLAGYL ) IVPB 500 mg        500 mg 100 mL/hr over 60 Minutes Intravenous Every 12 hours 07/31/24 1545     07/31/24 1400  ciprofloxacin  (CIPRO ) IVPB 400 mg        400 mg 200 mL/hr over 60 Minutes Intravenous  Once 07/31/24 1347 07/31/24 1620   07/31/24 1400  metroNIDAZOLE  (FLAGYL ) IVPB 500 mg        500 mg 100 mL/hr over 60 Minutes Intravenous  Once 07/31/24 1347 07/31/24 1547        I have personally reviewed the following labs and images: CBC: Recent Labs  Lab 07/31/24 1059 08/01/24 0448  WBC 11.6* 9.9  HGB 15.8 14.0  HCT 47.3 41.4  MCV 85.8 86.3  PLT 282 224   BMP &GFR Recent Labs  Lab 07/31/24 1059 08/01/24 0448  NA 133* 135  K 3.6 3.3*  CL 100 104  CO2 20* 23  GLUCOSE 194* 90  BUN 12 12  CREATININE 1.04 0.84  CALCIUM  9.4 8.5*  MG  --  1.8   Estimated Creatinine Clearance: 109.6 mL/min (by C-G formula based on SCr of 0.84 mg/dL). Liver & Pancreas: Recent Labs  Lab 07/31/24 1059 08/01/24 0448  AST 20 14*  ALT 16 15  ALKPHOS 48 37*  BILITOT 1.3* 1.0  PROT 7.1 5.9*  ALBUMIN 3.3* 2.7*   Recent Labs  Lab 07/31/24 1059  LIPASE 41   No results for input(s): AMMONIA in the last 168 hours. Diabetic: No results for input(s): HGBA1C in the last 72 hours. Recent Labs  Lab 07/31/24 1745 08/01/24 0020 08/01/24 0605  GLUCAP 111* 81 95   Cardiac Enzymes: No results for input(s): CKTOTAL, CKMB, CKMBINDEX, TROPONINI in the last 168 hours. No  results for input(s): PROBNP in the last 8760 hours. Coagulation Profile: No results for input(s): INR, PROTIME in the last 168 hours. Thyroid Function Tests: No results for input(s): TSH, T4TOTAL, FREET4, T3FREE, THYROIDAB in the last 72 hours. Lipid Profile: No results for input(s): CHOL, HDL, LDLCALC, TRIG, CHOLHDL, LDLDIRECT in the last 72 hours. Anemia Panel: No results for input(s): VITAMINB12, FOLATE, FERRITIN, TIBC, IRON, RETICCTPCT in the last 72 hours. Urine analysis:    Component Value Date/Time   COLORURINE AMBER (A) 07/31/2024 1052   APPEARANCEUR HAZY (A) 07/31/2024 1052   LABSPEC 1.035 (H) 07/31/2024 1052   PHURINE 5.0 07/31/2024 1052   GLUCOSEU >=500 (A) 07/31/2024 1052   HGBUR NEGATIVE 07/31/2024 1052   BILIRUBINUR SMALL (A) 07/31/2024 1052   BILIRUBINUR moderate (A) 07/31/2024 0907   KETONESUR 5 (A) 07/31/2024 1052   KETONESUR trace (5) (A) 07/31/2024  9092   PROTEINUR 100 (A) 07/31/2024 1052   UROBILINOGEN 1.0 07/31/2024 0907   NITRITE NEGATIVE 07/31/2024 1052   NITRITE Negative 07/31/2024 0907   LEUKOCYTESUR NEGATIVE 07/31/2024 1052   LEUKOCYTESUR Negative 07/31/2024 0907   Sepsis Labs: Invalid input(s): PROCALCITONIN, LACTICIDVEN  Microbiology: No results found for this or any previous visit (from the past 240 hours).  Radiology Studies: DG Abd 1 View Result Date: 08/01/2024 CLINICAL DATA:  Follow-up small bowel obstruction EXAM: ABDOMEN - 1 VIEW COMPARISON:  07/31/2024. FINDINGS: There are multiple loops of dilated small bowel consistent with a partial obstruction, without convincing change from the previous day's exams. IMPRESSION: 1. Persistent partial small bowel obstruction. Electronically Signed   By: Alm Parkins M.D.   On: 08/01/2024 10:32   CT ABDOMEN PELVIS W CONTRAST Result Date: 07/31/2024 EXAM: CT ABDOMEN AND PELVIS WITH CONTRAST 07/31/2024 01:01:31 PM TECHNIQUE: CT of the abdomen and pelvis was  performed with the administration of intravenous contrast. Multiplanar reformatted images are provided for review. Automated exposure control, iterative reconstruction, and/or weight based adjustment of the mA/kV was utilized to reduce the radiation dose to as low as reasonably achievable. COMPARISON: None available. CLINICAL HISTORY: Bowel obstruction suspected. Pt c/o vomiting and diarrhea x 5 days. Seen by PCP earlier in week and went back to UC today. UC sent pt for probably small bowel obstruction. FINDINGS: LOWER CHEST: No acute abnormality. LIVER: The liver is unremarkable. GALLBLADDER AND BILE DUCTS: Gallbladder is unremarkable. No biliary ductal dilatation. SPLEEN: No acute abnormality. PANCREAS: No acute abnormality. ADRENAL GLANDS: No acute abnormality. KIDNEYS, URETERS AND BLADDER: Bilateral Bosniak class 1 and 2 kidney cysts. The largest arises off the posterior cortex of the right kidney measuring 3.1 cm. No stones in the kidneys or ureters. No hydronephrosis. No perinephric or periureteral stranding. Urinary bladder is unremarkable. GI AND BOWEL: Multiple dilated loops of small bowel are noted measuring up to 4.2 cm in diameter. Transitioned to decreased caliber distal small bowel identified within the right hemi coronal image 85/6. Normal caliber terminal ileum. Moderate retained stool identified within the ascending colon. Sigmoid diverticulosis. Focally inflamed sigmoid diverticula noted within the left lower quadrant/3. PERITONEUM AND RETROPERITONEUM: Small volume of free fluid noted within the pelvis. No free air. VASCULATURE: Aortic atherosclerosis. LYMPH NODES: No signs of abdominopelvic adenopathy. REPRODUCTIVE ORGANS: Prostate gland enlargement with mass effect upon the base of bladder. BONES AND SOFT TISSUES: Degenerative changes noted within the lumbar spine. No acute or suspicious bone abnormality. IMPRESSION: 1. Small bowel obstruction with transition point in the right hemiabdomen. 2.  Uncomplicated acute sigmoid diverticulitis. No perforation or abscess. 3. Prostate gland enlargement with mass effect upon the base of bladder. 4. Aortic atherosclerotic calcification. Electronically signed by: Waddell Calk MD 07/31/2024 01:29 PM EDT RP Workstation: [id number]      Latunya Kissick T. Ramonita Koenig [facility]  If 7PM-7AM, please contact night-coverage [email or web address] 08/01/2024, 12:03 PM

## 2024-08-02 DIAGNOSIS — E66812 Obesity, class 2: Secondary | ICD-10-CM | POA: Diagnosis not present

## 2024-08-02 DIAGNOSIS — K56609 Unspecified intestinal obstruction, unspecified as to partial versus complete obstruction: Secondary | ICD-10-CM | POA: Diagnosis not present

## 2024-08-02 DIAGNOSIS — D72825 Bandemia: Secondary | ICD-10-CM | POA: Diagnosis not present

## 2024-08-02 DIAGNOSIS — K5732 Diverticulitis of large intestine without perforation or abscess without bleeding: Secondary | ICD-10-CM | POA: Diagnosis not present

## 2024-08-02 LAB — CBC
HCT: 42.4 % (ref 39.0–52.0)
Hemoglobin: 14.4 g/dL (ref 13.0–17.0)
MCH: 29.4 pg (ref 26.0–34.0)
MCHC: 34 g/dL (ref 30.0–36.0)
MCV: 86.7 fL (ref 80.0–100.0)
Platelets: 237 K/uL (ref 150–400)
RBC: 4.89 MIL/uL (ref 4.22–5.81)
RDW: 12.8 % (ref 11.5–15.5)
WBC: 8.7 K/uL (ref 4.0–10.5)
nRBC: 0 % (ref 0.0–0.2)

## 2024-08-02 LAB — RENAL FUNCTION PANEL
Albumin: 3.1 g/dL — ABNORMAL LOW (ref 3.5–5.0)
Anion gap: 11 (ref 5–15)
BUN: 9 mg/dL (ref 8–23)
CO2: 20 mmol/L — ABNORMAL LOW (ref 22–32)
Calcium: 8.7 mg/dL — ABNORMAL LOW (ref 8.9–10.3)
Chloride: 103 mmol/L (ref 98–111)
Creatinine, Ser: 0.91 mg/dL (ref 0.61–1.24)
GFR, Estimated: >60 mL/min (ref >60)
Glucose, Bld: 132 mg/dL — ABNORMAL HIGH (ref 70–99)
Phosphorus: 3.7 mg/dL (ref 2.5–4.6)
Potassium: 4.1 mmol/L (ref 3.5–5.1)
Sodium: 134 mmol/L — ABNORMAL LOW (ref 135–145)

## 2024-08-02 LAB — GLUCOSE, CAPILLARY
Glucose-Capillary: 132 mg/dL — ABNORMAL HIGH (ref 70–99)
Glucose-Capillary: 172 mg/dL — ABNORMAL HIGH (ref 70–99)
Glucose-Capillary: 156 mg/dL — ABNORMAL HIGH (ref 70–99)
Glucose-Capillary: 140 mg/dL — ABNORMAL HIGH (ref 70–99)

## 2024-08-02 LAB — MAGNESIUM: Magnesium: 1.8 mg/dL (ref 1.7–2.4)

## 2024-08-02 MED ORDER — SODIUM CHLORIDE 0.9 % IV SOLN
2.0000 g | INTRAVENOUS | Status: DC
  Administered 2024-08-02 – 2024-08-08 (×7): 2 g via INTRAVENOUS
  Filled 2024-08-02 (×7): qty 20

## 2024-08-02 NOTE — Progress Notes (Signed)
 PROGRESS NOTE  Brett Stanley FMW:[id number] DOB: [birth year]   PCP: [name], MD  Patient is from: Home.  Lives with his wife.  Independently ambulates at baseline.  DOA: 07/31/2024 LOS: 2  Chief complaints Chief Complaint  Patient presents with   Diarrhea     Brief Narrative / Interim history: 68 year old M with PMH of OSA not on CPAP, DM-2, HTN and morbid obesity presenting with abdominal pain and distention for about 5 days, and admitted with small bowel obstruction and sigmoid diverticulitis.  Recently diagnosed with viral gastroenteritis after he presented there with abdominal discomfort, diarrhea and vomiting.  No prior abdominal surgery.  Colonoscopy in 2021 with two 8 to 10 mm polyps.  CT abdomen and pelvis showed SBO with transition point in right hemiabdomen and uncomplicated acute sigmoid diverticulitis without perforation.  Patient was started on IV antibiotics.  Surgery consulted and following.  Clinically improving.  He did not require NG tube  Subjective: Seen and examined earlier this morning.  No major events overnight or this morning.  Reports passing gas yesterday but not today.  No bowel movement yet.  Tolerated clear liquid diet.  No nausea, vomiting or abdominal pain.  Ambulated in hallway.   Objective: Vitals:   08/01/24 1732 08/01/24 2012 08/02/24 0336 08/02/24 0834  BP: 109/74 111/74 115/71 98/79  Pulse: 89 78 69 83  Resp: 18 17 18 17   Temp: 98.5 F (36.9 C) 97.9 F (36.6 C) 97.7 F (36.5 C) 97.6 F (36.4 C)  TempSrc:  Oral Oral Oral  SpO2: 97% 97% 96% 98%  Weight:      Height:        Examination:  GENERAL: No apparent distress.  Nontoxic. HEENT: MMM.  Vision and hearing grossly intact.  NECK: Supple.  No apparent JVD.  RESP:  No IWOB.  Fair aeration bilaterally. CVS:  RRR. Heart sounds normal.  ABD/GI/GU: BS+. Abd soft, NTND.  MSK/EXT:  Moves extremities. No apparent deformity. No edema.  SKIN: no apparent skin lesion or wound NEURO:  AA.  Oriented appropriately.  No apparent focal neuro deficit. PSYCH: Calm. Normal affect.   Consultants:  General surgery  Procedures: None  Microbiology summarized: None  Assessment and plan: Small bowel obstruction: Presents with abdominal pain and distention preceded by nausea, vomiting and diarrhea earlier in the week.  CT showed SBO with transition point in right hemiabdomen.  Reports flatus yesterday evening.  Denies pain, nausea or vomiting.  Nontender. -Appreciate help by general surgery-FLD. -Advised patient to ambulate as often as possible.  Acute sigmoid diverticulitis: Nontender.  Leukocytosis resolved. - Cipro  and Flagyl >> ceftriaxone  and Flagyl  - Diet as above.  NIDDM-2 with hyperglycemia: A1c 7.9% in 03/2024.  On metformin , glipizide  and Ozempic . Recent Labs  Lab 08/01/24 0605 08/01/24 1735 08/01/24 2157 08/02/24 0834 08/02/24 1216  GLUCAP 95 146* 110* 132* 172*  - SSI-very sensitive.  Essential hypertension: Normotensive -Continue holding home lisinopril   Hyponatremia: Resolved  Leukocytosis: Likely due to diverticulitis.  Resolved  History of OSA: Not on CPAP  Hypokalemia -Monitor replenish K and Mg as appropriate   Morbid obesity: Elevated BMI with diabetes and hypertension. Body mass index is 38.4 kg/m.          DVT prophylaxis:  enoxaparin  (LOVENOX ) injection 40 mg Start: 08/01/24 1000  Code Status: Full code Family Communication: Updated patient's son at bedside. Level of care: Med-Surg Status is: Inpatient Remains inpatient appropriate because: SBO and acute diverticulitis   Final disposition: Home   35  minutes with more than 50% spent in reviewing records, counseling patient/family and coordinating care.   Sch Meds:  Scheduled Meds:  enoxaparin  (LOVENOX ) injection  40 mg Subcutaneous Q24H   insulin  aspart  0-6 Units Subcutaneous TID WC   sodium chloride  flush  3 mL Intravenous Q12H   Continuous Infusions:  cefTRIAXone   (ROCEPHIN )  IV     metronidazole  500 mg (08/02/24 0355)   PRN Meds:.acetaminophen  **OR** acetaminophen , albuterol , hydrALAZINE , morphine  injection, ondansetron  **OR** ondansetron  (ZOFRAN ) IV, mouth rinse  Antimicrobials: Anti-infectives (From admission, onward)    Start     Dose/Rate Route Frequency Ordered Stop   08/02/24 1600  cefTRIAXone  (ROCEPHIN ) 2 g in sodium chloride  0.9 % 100 mL IVPB        2 g 200 mL/hr over 30 Minutes Intravenous Every 24 hours 08/02/24 0957     08/01/24 0300  ciprofloxacin  (CIPRO ) IVPB 400 mg  Status:  Discontinued        400 mg 200 mL/hr over 60 Minutes Intravenous Every 12 hours 07/31/24 1545 08/02/24 0957   08/01/24 0300  metroNIDAZOLE  (FLAGYL ) IVPB 500 mg        500 mg 100 mL/hr over 60 Minutes Intravenous Every 12 hours 07/31/24 1545     07/31/24 1400  ciprofloxacin  (CIPRO ) IVPB 400 mg        400 mg 200 mL/hr over 60 Minutes Intravenous  Once 07/31/24 1347 07/31/24 1620   07/31/24 1400  metroNIDAZOLE  (FLAGYL ) IVPB 500 mg        500 mg 100 mL/hr over 60 Minutes Intravenous  Once 07/31/24 1347 07/31/24 1547        I have personally reviewed the following labs and images: CBC: Recent Labs  Lab 07/31/24 1059 08/01/24 0448 08/02/24 1000  WBC 11.6* 9.9 8.7  HGB 15.8 14.0 14.4  HCT 47.3 41.4 42.4  MCV 85.8 86.3 86.7  PLT 282 224 237   BMP &GFR Recent Labs  Lab 07/31/24 1059 08/01/24 0448 08/02/24 1000  NA 133* 135 134*  K 3.6 3.3* 4.1  CL 100 104 103  CO2 20* 23 20*  GLUCOSE 194* 90 132*  BUN 12 12 9   CREATININE 1.04 0.84 0.91  CALCIUM  9.4 8.5* 8.7*  MG  --  1.8 1.8  PHOS  --   --  3.7   Estimated Creatinine Clearance: 101.2 mL/min (by C-G formula based on SCr of 0.91 mg/dL). Liver & Pancreas: Recent Labs  Lab 07/31/24 1059 08/01/24 0448 08/02/24 1000  AST 20 14*  --   ALT 16 15  --   ALKPHOS 48 37*  --   BILITOT 1.3* 1.0  --   PROT 7.1 5.9*  --   ALBUMIN 3.3* 2.7* 3.1*   Recent Labs  Lab 07/31/24 1059  LIPASE 41    No results for input(s): AMMONIA in the last 168 hours. Diabetic: No results for input(s): HGBA1C in the last 72 hours. Recent Labs  Lab 08/01/24 0605 08/01/24 1735 08/01/24 2157 08/02/24 0834 08/02/24 1216  GLUCAP 95 146* 110* 132* 172*   Cardiac Enzymes: No results for input(s): CKTOTAL, CKMB, CKMBINDEX, TROPONINI in the last 168 hours. No results for input(s): PROBNP in the last 8760 hours. Coagulation Profile: No results for input(s): INR, PROTIME in the last 168 hours. Thyroid Function Tests: No results for input(s): TSH, T4TOTAL, FREET4, T3FREE, THYROIDAB in the last 72 hours. Lipid Profile: No results for input(s): CHOL, HDL, LDLCALC, TRIG, CHOLHDL, LDLDIRECT in the last 72 hours. Anemia Panel: No results for  input(s): VITAMINB12, FOLATE, FERRITIN, TIBC, IRON, RETICCTPCT in the last 72 hours. Urine analysis:    Component Value Date/Time   COLORURINE AMBER (A) 07/31/2024 1052   APPEARANCEUR HAZY (A) 07/31/2024 1052   LABSPEC 1.035 (H) 07/31/2024 1052   PHURINE 5.0 07/31/2024 1052   GLUCOSEU >=500 (A) 07/31/2024 1052   HGBUR NEGATIVE 07/31/2024 1052   BILIRUBINUR SMALL (A) 07/31/2024 1052   BILIRUBINUR moderate (A) 07/31/2024 0907   KETONESUR 5 (A) 07/31/2024 1052   KETONESUR trace (5) (A) 07/31/2024 0907   PROTEINUR 100 (A) 07/31/2024 1052   UROBILINOGEN 1.0 07/31/2024 0907   NITRITE NEGATIVE 07/31/2024 1052   NITRITE Negative 07/31/2024 0907   LEUKOCYTESUR NEGATIVE 07/31/2024 1052   LEUKOCYTESUR Negative 07/31/2024 0907   Sepsis Labs: Invalid input(s): PROCALCITONIN, LACTICIDVEN  Microbiology: No results found for this or any previous visit (from the past 240 hours).  Radiology Studies: No results found.     Bera Pinela T. Jozie Wulf [facility]  If 7PM-7AM, please contact night-coverage [email or web address] 08/02/2024, 12:25 PM

## 2024-08-02 NOTE — Progress Notes (Signed)
 Subjective: CC: Reports some bloating after CLD but otherwise tolerating without abdominal pain, n/v. No further flatus since I saw him yesterday. No BM.    Objective: Vital signs in last 24 hours: Temp:  [97.6 F (36.4 C)-98.5 F (36.9 C)] 97.6 F (36.4 C) (08/19 0834) Pulse Rate:  [69-89] 83 (08/19 0834) Resp:  [17-18] 17 (08/19 0834) BP: (98-115)/(71-79) 98/79 (08/19 0834) SpO2:  [96 %-98 %] 98 % (08/19 0834) Last BM Date : 07/28/24  Intake/Output from previous day: 08/18 0701 - 08/19 0700 In: 4142 [P.O.:1618; I.V.:1724.1; IV Piggyback:799.9] Out: 500 [Urine:500] Intake/Output this shift: No intake/output data recorded.  PE: Gen:  Alert, NAD, pleasant Abd: Soft, protuberant, mild distension that is stable from yesterday, NT  Lab Results:  Recent Labs    07/31/24 1059 08/01/24 0448  WBC 11.6* 9.9  HGB 15.8 14.0  HCT 47.3 41.4  PLT 282 224   BMET Recent Labs    07/31/24 1059 08/01/24 0448  NA 133* 135  K 3.6 3.3*  CL 100 104  CO2 20* 23  GLUCOSE 194* 90  BUN 12 12  CREATININE 1.04 0.84  CALCIUM  9.4 8.5*   PT/INR No results for input(s): LABPROT, INR in the last 72 hours. CMP     Component Value Date/Time   NA 135 08/01/2024 0448   K 3.3 (L) 08/01/2024 0448   CL 104 08/01/2024 0448   CO2 23 08/01/2024 0448   GLUCOSE 90 08/01/2024 0448   BUN 12 08/01/2024 0448   CREATININE 0.84 08/01/2024 0448   CALCIUM  8.5 (L) 08/01/2024 0448   PROT 5.9 (L) 08/01/2024 0448   ALBUMIN 2.7 (L) 08/01/2024 0448   AST 14 (L) 08/01/2024 0448   ALT 15 08/01/2024 0448   ALKPHOS 37 (L) 08/01/2024 0448   BILITOT 1.0 08/01/2024 0448   GFRNONAA >60 08/01/2024 0448   GFRAA 114 02/13/2009 0935   Lipase     Component Value Date/Time   LIPASE 41 07/31/2024 1059    Studies/Results: DG Abd 1 View Result Date: 08/01/2024 CLINICAL DATA:  Follow-up small bowel obstruction EXAM: ABDOMEN - 1 VIEW COMPARISON:  07/31/2024. FINDINGS: There are multiple loops of  dilated small bowel consistent with a partial obstruction, without convincing change from the previous day's exams. IMPRESSION: 1. Persistent partial small bowel obstruction. Electronically Signed   By: Alm Parkins M.D.   On: 08/01/2024 10:32   CT ABDOMEN PELVIS W CONTRAST Result Date: 07/31/2024 EXAM: CT ABDOMEN AND PELVIS WITH CONTRAST 07/31/2024 01:01:31 PM TECHNIQUE: CT of the abdomen and pelvis was performed with the administration of intravenous contrast. Multiplanar reformatted images are provided for review. Automated exposure control, iterative reconstruction, and/or weight based adjustment of the mA/kV was utilized to reduce the radiation dose to as low as reasonably achievable. COMPARISON: None available. CLINICAL HISTORY: Bowel obstruction suspected. Pt c/o vomiting and diarrhea x 5 days. Seen by PCP earlier in week and went back to UC today. UC sent pt for probably small bowel obstruction. FINDINGS: LOWER CHEST: No acute abnormality. LIVER: The liver is unremarkable. GALLBLADDER AND BILE DUCTS: Gallbladder is unremarkable. No biliary ductal dilatation. SPLEEN: No acute abnormality. PANCREAS: No acute abnormality. ADRENAL GLANDS: No acute abnormality. KIDNEYS, URETERS AND BLADDER: Bilateral Bosniak class 1 and 2 kidney cysts. The largest arises off the posterior cortex of the right kidney measuring 3.1 cm. No stones in the kidneys or ureters. No hydronephrosis. No perinephric or periureteral stranding. Urinary bladder is unremarkable. GI AND BOWEL: Multiple dilated  loops of small bowel are noted measuring up to 4.2 cm in diameter. Transitioned to decreased caliber distal small bowel identified within the right hemi coronal image 85/6. Normal caliber terminal ileum. Moderate retained stool identified within the ascending colon. Sigmoid diverticulosis. Focally inflamed sigmoid diverticula noted within the left lower quadrant/3. PERITONEUM AND RETROPERITONEUM: Small volume of free fluid noted within  the pelvis. No free air. VASCULATURE: Aortic atherosclerosis. LYMPH NODES: No signs of abdominopelvic adenopathy. REPRODUCTIVE ORGANS: Prostate gland enlargement with mass effect upon the base of bladder. BONES AND SOFT TISSUES: Degenerative changes noted within the lumbar spine. No acute or suspicious bone abnormality. IMPRESSION: 1. Small bowel obstruction with transition point in the right hemiabdomen. 2. Uncomplicated acute sigmoid diverticulitis. No perforation or abscess. 3. Prostate gland enlargement with mass effect upon the base of bladder. 4. Aortic atherosclerotic calcification. Electronically signed by: Waddell Calk MD 07/31/2024 01:29 PM EDT RP Workstation: [id number]   DG Abd 1 View Result Date: 07/31/2024 EXAM: 1 VIEW XRAY OF THE ABDOMEN 07/31/2024 09:18:29 AM COMPARISON: None available. CLINICAL HISTORY: Acute abdominal pain, distension, nausea, vomiting. Pt st's he had nausea vomiting and diarrhea onset 5 days ago. Was seen by his doctor 4 days ago. Pt st's he started feeling better but nausea, bloating and abd pain returned yesterday. Pt st's after vomiting large amount last pm he felt better. Hypo-active bowel sounds in all 4 quads. FINDINGS: BOWEL: There are multiple dilated small bowel loops within the upper abdomen with air-fluid levels. These measure up to 5.1 cm in diameter. There is moderate retained stool in the right colon and rectum. SOFT TISSUES: No abnormal calcifications. No opaque urinary calculi. BONES: No acute osseous abnormality. IMPRESSION: 1. Multiple dilated small bowel loops with air-fluid levels, measuring up to 5.1 cm in diameter, consistent with small bowel obstruction. 2. Moderate retained stool in the right colon and rectum. 3. The urgent finding will be called to the ordering provider by the Professional Radiology Assistants (PRAs) and documented in the [facility] dashboard. Electronically signed by: Waddell Calk MD 07/31/2024 09:30 AM EDT RP Workstation: [id number]     Anti-infectives: Anti-infectives (From admission, onward)    Start     Dose/Rate Route Frequency Ordered Stop   08/01/24 0300  ciprofloxacin  (CIPRO ) IVPB 400 mg        400 mg 200 mL/hr over 60 Minutes Intravenous Every 12 hours 07/31/24 1545     08/01/24 0300  metroNIDAZOLE  (FLAGYL ) IVPB 500 mg        500 mg 100 mL/hr over 60 Minutes Intravenous Every 12 hours 07/31/24 1545     07/31/24 1400  ciprofloxacin  (CIPRO ) IVPB 400 mg        400 mg 200 mL/hr over 60 Minutes Intravenous  Once 07/31/24 1347 07/31/24 1620   07/31/24 1400  metroNIDAZOLE  (FLAGYL ) IVPB 500 mg        500 mg 100 mL/hr over 60 Minutes Intravenous  Once 07/31/24 1347 07/31/24 1547        Assessment/Plan Brett Stanley is an 68 y.o. male with short segment simple sigmoid diverticulitis and a small bowel obstruction.  - No indication for emergency surgery - No prior abdominal surgeries. Agree with Dr. Lyndel that question if an omental band scarred to his colon causing this presentation. Hopefully as his diverticulitis improves, his SBO will also improve. - Afebrile. No tachycardia or hypotension. WBC wnl on last check.  - Cont IV abx - Adv to FLD. Would hold on advancing past FLD toady until we  ensure he is having bowel function - Hopefully improves with conservative management. If fails to improve, may need OR during admission. If improves with conservative management, would recommend follow up with GI to consider colonoscopy in 6 weeks given this is his first episode of diverticulitis  FEN - FLD, IVF per primary  VTE - SCDs, Lovenox  ID - Cipro /Flagyl  (I do not see a PCN allergy listed in his chart, patient reports he has tolerated amoxicillin  in the past, recommend switching to Rocephin /Flagyl )  HTN HLD DM OSA  I reviewed nursing notes, hospitalist notes, last 24 h vitals and pain scores, last 48 h intake and output, last 24 h labs and trends, and last 24 h imaging results.    LOS: 2 days     Brett Stanley, [facility] 08/02/2024, 8:59 AM Please see Amion for pager number during day hours 7:00am-4:30pm

## 2024-08-02 NOTE — Plan of Care (Signed)

## 2024-08-02 NOTE — Plan of Care (Signed)

## 2024-08-03 ENCOUNTER — Inpatient Hospital Stay (HOSPITAL_COMMUNITY)

## 2024-08-03 DIAGNOSIS — E66812 Obesity, class 2: Secondary | ICD-10-CM | POA: Diagnosis not present

## 2024-08-03 DIAGNOSIS — K5732 Diverticulitis of large intestine without perforation or abscess without bleeding: Secondary | ICD-10-CM | POA: Diagnosis not present

## 2024-08-03 DIAGNOSIS — D72825 Bandemia: Secondary | ICD-10-CM | POA: Diagnosis not present

## 2024-08-03 DIAGNOSIS — K56609 Unspecified intestinal obstruction, unspecified as to partial versus complete obstruction: Secondary | ICD-10-CM | POA: Diagnosis not present

## 2024-08-03 LAB — RENAL FUNCTION PANEL
Albumin: 3 g/dL — ABNORMAL LOW (ref 3.5–5.0)
Anion gap: 10 (ref 5–15)
BUN: 7 mg/dL — ABNORMAL LOW (ref 8–23)
CO2: 26 mmol/L (ref 22–32)
Calcium: 9 mg/dL (ref 8.9–10.3)
Chloride: 98 mmol/L (ref 98–111)
Creatinine, Ser: 0.95 mg/dL (ref 0.61–1.24)
GFR, Estimated: >60 mL/min (ref >60)
Glucose, Bld: 163 mg/dL — ABNORMAL HIGH (ref 70–99)
Phosphorus: 3.3 mg/dL (ref 2.5–4.6)
Potassium: 4.7 mmol/L (ref 3.5–5.1)
Sodium: 134 mmol/L — ABNORMAL LOW (ref 135–145)

## 2024-08-03 LAB — GLUCOSE, CAPILLARY
Glucose-Capillary: 148 mg/dL — ABNORMAL HIGH (ref 70–99)
Glucose-Capillary: 136 mg/dL — ABNORMAL HIGH (ref 70–99)
Glucose-Capillary: 132 mg/dL — ABNORMAL HIGH (ref 70–99)
Glucose-Capillary: 117 mg/dL — ABNORMAL HIGH (ref 70–99)

## 2024-08-03 LAB — CBC
HCT: 42.5 % (ref 39.0–52.0)
Hemoglobin: 14 g/dL (ref 13.0–17.0)
MCH: 28.6 pg (ref 26.0–34.0)
MCHC: 32.9 g/dL (ref 30.0–36.0)
MCV: 86.9 fL (ref 80.0–100.0)
Platelets: 244 K/uL (ref 150–400)
RBC: 4.89 MIL/uL (ref 4.22–5.81)
RDW: 12.6 % (ref 11.5–15.5)
WBC: 9.9 K/uL (ref 4.0–10.5)
nRBC: 0 % (ref 0.0–0.2)

## 2024-08-03 LAB — MAGNESIUM: Magnesium: 1.8 mg/dL (ref 1.7–2.4)

## 2024-08-03 MED ORDER — DIATRIZOATE MEGLUMINE & SODIUM 66-10 % PO SOLN
90.0000 mL | Freq: Once | ORAL | Status: AC
  Administered 2024-08-03: 90 mL via ORAL
  Filled 2024-08-03 (×2): qty 90

## 2024-08-03 MED ORDER — ALUM & MAG HYDROXIDE-SIMETH 200-200-20 MG/5ML PO SUSP
15.0000 mL | Freq: Once | ORAL | Status: AC
  Administered 2024-08-03: 15 mL via ORAL
  Filled 2024-08-03: qty 30

## 2024-08-03 MED ORDER — PANTOPRAZOLE SODIUM 40 MG IV SOLR
40.0000 mg | Freq: Two times a day (BID) | INTRAVENOUS | Status: DC
  Administered 2024-08-03 – 2024-08-09 (×13): 40 mg via INTRAVENOUS
  Filled 2024-08-03 (×14): qty 10

## 2024-08-03 MED ORDER — PANTOPRAZOLE SODIUM 40 MG IV SOLR
40.0000 mg | Freq: Once | INTRAVENOUS | Status: AC
  Administered 2024-08-03: 40 mg via INTRAVENOUS

## 2024-08-03 MED ORDER — PROCHLORPERAZINE EDISYLATE 10 MG/2ML IJ SOLN
5.0000 mg | Freq: Once | INTRAMUSCULAR | Status: AC | PRN
  Administered 2024-08-03: 5 mg via INTRAVENOUS
  Filled 2024-08-03: qty 2

## 2024-08-03 MED ORDER — SODIUM CHLORIDE 0.9 % IV SOLN: INTRAVENOUS | Status: AC

## 2024-08-03 MED ORDER — INSULIN ASPART 100 UNIT/ML IJ SOLN
0.0000 [IU] | Freq: Four times a day (QID) | INTRAMUSCULAR | Status: DC
  Administered 2024-08-04 – 2024-08-05 (×3): 1 [IU] via SUBCUTANEOUS

## 2024-08-03 MED ORDER — FAMOTIDINE IN NACL 20-0.9 MG/50ML-% IV SOLN
20.0000 mg | Freq: Once | INTRAVENOUS | Status: AC
  Administered 2024-08-04: 20 mg via INTRAVENOUS
  Filled 2024-08-03: qty 50

## 2024-08-03 NOTE — Care Management Important Message (Signed)
 Important Message  Patient Details  Name: Brett Stanley MRN: [id number] Date of Birth: Dec 22, 1955   Important Message Given:  Yes - Medicare IM     Jon Cruel 08/03/2024, 10:25 AM

## 2024-08-03 NOTE — Progress Notes (Signed)
 PROGRESS NOTE  Brett Stanley FMW:[id number] DOB: February 23, 1956   PCP: [name], MD  Patient is from: Home.  Lives with his wife.  Independently ambulates at baseline.  DOA: 07/31/2024 LOS: 3  Chief complaints Chief Complaint  Patient presents with   Diarrhea     Brief Narrative / Interim history: 68 year old M with PMH of OSA not on CPAP, DM-2, HTN and morbid obesity presenting with abdominal pain and distention for about 5 days, and admitted with small bowel obstruction and sigmoid diverticulitis.  Recently diagnosed with viral gastroenteritis after he presented there with abdominal discomfort, diarrhea and vomiting.  No prior abdominal surgery.  Colonoscopy in 2021 with two 8 to 10 mm polyps.  CT abdomen and pelvis showed SBO with transition point in right hemiabdomen and uncomplicated acute sigmoid diverticulitis without perforation.  Patient was started on IV antibiotics.  Surgery consulted and following.  Clinically improving.  He did not require NG tube  Subjective: Seen and examined earlier this morning.  Had an episode of emesis about 4 AM this morning.  He said he had some indigestion yesterday.  No bowel movement or flatus yet.  Currently denies nausea or abdominal pain.  Abdomen is a little distended.  Objective: Vitals:   08/02/24 1738 08/02/24 1954 08/03/24 0413 08/03/24 0851  BP: 128/84 (!) 103/58 132/88 117/81  Pulse: 85 72 93 76  Resp: 17 18 16 18   Temp:  97.7 F (36.5 C) 97.9 F (36.6 C) 98 F (36.7 C)  TempSrc:  Oral Oral   SpO2: 98% 96% 96% 95%  Weight:      Height:        Examination:  GENERAL: No apparent distress.  Nontoxic. HEENT: MMM.  Vision and hearing grossly intact.  NECK: Supple.  No apparent JVD.  RESP:  No IWOB.  Fair aeration bilaterally. CVS:  RRR. Heart sounds normal.  ABD/GI/GU: BS+. Abd distended.  Nontender. MSK/EXT:  Moves extremities. No apparent deformity. No edema.  SKIN: no apparent skin lesion or wound NEURO: AA.  Oriented  appropriately.  No apparent focal neuro deficit. PSYCH: Calm. Normal affect.   Consultants:  General surgery  Procedures: None  Microbiology summarized: None  Assessment and plan: Small bowel obstruction: Presents with abdominal pain and distention preceded by nausea, vomiting and diarrhea earlier in the week.  CT showed SBO with transition point in right hemiabdomen.  Had an episode of emesis earlier this morning after interval improvement.  Abdomen is distended this morning.  No BM or flatus yesterday and today. -Appreciate help by general surgery-n.p.o., IVF, abdominal film -Add IV Protonix  40 mg twice daily for indigestion -Antiemetics as needed -Encourage ambulation  Acute sigmoid diverticulitis: Nontender.  Leukocytosis resolved. - Cipro  and Flagyl >> ceftriaxone  and Flagyl   NIDDM-2 with hyperglycemia: A1c 7.9% in 03/2024.  On metformin , glipizide  and Ozempic . Recent Labs  Lab 08/02/24 0834 08/02/24 1216 08/02/24 1739 08/02/24 2058 08/03/24 0850  GLUCAP 132* 172* 156* 140* 148*  -SSI-very sensitive every 6 hours  Essential hypertension: Normotensive -Continue holding home lisinopril   Hyponatremia: Mild - Monitor  Leukocytosis: Likely due to diverticulitis.  Resolved  History of OSA: Not on CPAP  Hypokalemia -Monitor replenish K and Mg as appropriate   Morbid obesity: Elevated BMI with diabetes and hypertension. Body mass index is 38.4 kg/m.          DVT prophylaxis:  enoxaparin  (LOVENOX ) injection 40 mg Start: 08/01/24 1000  Code Status: Full code Family Communication: None at bedside. Level of care: Med-Surg  Status is: Inpatient Remains inpatient appropriate because: SBO and acute diverticulitis   Final disposition: Home   35 minutes with more than 50% spent in reviewing records, counseling patient/family and coordinating care.   Sch Meds:  Scheduled Meds:  enoxaparin  (LOVENOX ) injection  40 mg Subcutaneous Q24H   insulin  aspart  0-6 Units  Subcutaneous TID WC   pantoprazole  (PROTONIX ) IV  40 mg Intravenous Q12H   sodium chloride  flush  3 mL Intravenous Q12H   Continuous Infusions:  cefTRIAXone  (ROCEPHIN )  IV Stopped (08/02/24 1652)   metronidazole  500 mg (08/03/24 0350)   PRN Meds:.acetaminophen  **OR** acetaminophen , albuterol , hydrALAZINE , morphine  injection, ondansetron  **OR** ondansetron  (ZOFRAN ) IV, mouth rinse  Antimicrobials: Anti-infectives (From admission, onward)    Start     Dose/Rate Route Frequency Ordered Stop   08/02/24 1600  cefTRIAXone  (ROCEPHIN ) 2 g in sodium chloride  0.9 % 100 mL IVPB        2 g 200 mL/hr over 30 Minutes Intravenous Every 24 hours 08/02/24 0957     08/01/24 0300  ciprofloxacin  (CIPRO ) IVPB 400 mg  Status:  Discontinued        400 mg 200 mL/hr over 60 Minutes Intravenous Every 12 hours 07/31/24 1545 08/02/24 0957   08/01/24 0300  metroNIDAZOLE  (FLAGYL ) IVPB 500 mg        500 mg 100 mL/hr over 60 Minutes Intravenous Every 12 hours 07/31/24 1545     07/31/24 1400  ciprofloxacin  (CIPRO ) IVPB 400 mg        400 mg 200 mL/hr over 60 Minutes Intravenous  Once 07/31/24 1347 07/31/24 1620   07/31/24 1400  metroNIDAZOLE  (FLAGYL ) IVPB 500 mg        500 mg 100 mL/hr over 60 Minutes Intravenous  Once 07/31/24 1347 07/31/24 1547        I have personally reviewed the following labs and images: CBC: Recent Labs  Lab 07/31/24 1059 08/01/24 0448 08/02/24 1000 08/03/24 0306  WBC 11.6* 9.9 8.7 9.9  HGB 15.8 14.0 14.4 14.0  HCT 47.3 41.4 42.4 42.5  MCV 85.8 86.3 86.7 86.9  PLT 282 224 237 244   BMP &GFR Recent Labs  Lab 07/31/24 1059 08/01/24 0448 08/02/24 1000 08/03/24 0306  NA 133* 135 134* 134*  K 3.6 3.3* 4.1 4.7  CL 100 104 103 98  CO2 20* 23 20* 26  GLUCOSE 194* 90 132* 163*  BUN 12 12 9  7*  CREATININE 1.04 0.84 0.91 0.95  CALCIUM  9.4 8.5* 8.7* 9.0  MG  --  1.8 1.8 1.8  PHOS  --   --  3.7 3.3   Estimated Creatinine Clearance: 96.9 mL/min (by C-G formula based on SCr  of 0.95 mg/dL). Liver & Pancreas: Recent Labs  Lab 07/31/24 1059 08/01/24 0448 08/02/24 1000 08/03/24 0306  AST 20 14*  --   --   ALT 16 15  --   --   ALKPHOS 48 37*  --   --   BILITOT 1.3* 1.0  --   --   PROT 7.1 5.9*  --   --   ALBUMIN 3.3* 2.7* 3.1* 3.0*   Recent Labs  Lab 07/31/24 1059  LIPASE 41   No results for input(s): AMMONIA in the last 168 hours. Diabetic: No results for input(s): HGBA1C in the last 72 hours. Recent Labs  Lab 08/02/24 0834 08/02/24 1216 08/02/24 1739 08/02/24 2058 08/03/24 0850  GLUCAP 132* 172* 156* 140* 148*   Cardiac Enzymes: No results for input(s): CKTOTAL, CKMB, CKMBINDEX, TROPONINI  in the last 168 hours. No results for input(s): PROBNP in the last 8760 hours. Coagulation Profile: No results for input(s): INR, PROTIME in the last 168 hours. Thyroid Function Tests: No results for input(s): TSH, T4TOTAL, FREET4, T3FREE, THYROIDAB in the last 72 hours. Lipid Profile: No results for input(s): CHOL, HDL, LDLCALC, TRIG, CHOLHDL, LDLDIRECT in the last 72 hours. Anemia Panel: No results for input(s): VITAMINB12, FOLATE, FERRITIN, TIBC, IRON, RETICCTPCT in the last 72 hours. Urine analysis:    Component Value Date/Time   COLORURINE AMBER (A) 07/31/2024 1052   APPEARANCEUR HAZY (A) 07/31/2024 1052   LABSPEC 1.035 (H) 07/31/2024 1052   PHURINE 5.0 07/31/2024 1052   GLUCOSEU >=500 (A) 07/31/2024 1052   HGBUR NEGATIVE 07/31/2024 1052   BILIRUBINUR SMALL (A) 07/31/2024 1052   BILIRUBINUR moderate (A) 07/31/2024 0907   KETONESUR 5 (A) 07/31/2024 1052   KETONESUR trace (5) (A) 07/31/2024 0907   PROTEINUR 100 (A) 07/31/2024 1052   UROBILINOGEN 1.0 07/31/2024 0907   NITRITE NEGATIVE 07/31/2024 1052   NITRITE Negative 07/31/2024 0907   LEUKOCYTESUR NEGATIVE 07/31/2024 1052   LEUKOCYTESUR Negative 07/31/2024 0907   Sepsis Labs: Invalid input(s): PROCALCITONIN,  LACTICIDVEN  Microbiology: No results found for this or any previous visit (from the past 240 hours).  Radiology Studies: No results found.     Roxane Puerto T. Raegyn Renda [facility]  If 7PM-7AM, please contact night-coverage [email or web address] 08/03/2024, 10:16 AM

## 2024-08-03 NOTE — Progress Notes (Addendum)
 Subjective: CC: Reports he felt full after FLD for dinner last night. Episode of n/v overnight. He reports he thinks this may be from medication though, rather than PO intake. Reports he feels better this am with no abdominal pain, bloating, n/v. No flatus or BM since I saw him yesterday.   Objective: Vital signs in last 24 hours: Temp:  [97.7 F (36.5 C)-98 F (36.7 C)] 98 F (36.7 C) (08/20 0851) Pulse Rate:  [72-93] 76 (08/20 0851) Resp:  [16-18] 18 (08/20 0851) BP: (103-132)/(58-88) 117/81 (08/20 0851) SpO2:  [95 %-98 %] 95 % (08/20 0851) Last BM Date : 07/28/24  Intake/Output from previous day: 08/19 0701 - 08/20 0700 In: 971.9 [P.O.:680; I.V.:3; IV Piggyback:288.9] Out: 700 [Urine:700] Intake/Output this shift: No intake/output data recorded.  PE: Gen:  Alert, NAD, pleasant Abd: Soft, protuberant, at least mild distension that is similar from yesterday, NT  Lab Results:  Recent Labs    08/02/24 1000 08/03/24 0306  WBC 8.7 9.9  HGB 14.4 14.0  HCT 42.4 42.5  PLT 237 244   BMET Recent Labs    08/02/24 1000 08/03/24 0306  NA 134* 134*  K 4.1 4.7  CL 103 98  CO2 20* 26  GLUCOSE 132* 163*  BUN 9 7*  CREATININE 0.91 0.95  CALCIUM  8.7* 9.0   PT/INR No results for input(s): LABPROT, INR in the last 72 hours. CMP     Component Value Date/Time   NA 134 (L) 08/03/2024 0306   K 4.7 08/03/2024 0306   CL 98 08/03/2024 0306   CO2 26 08/03/2024 0306   GLUCOSE 163 (H) 08/03/2024 0306   BUN 7 (L) 08/03/2024 0306   CREATININE 0.95 08/03/2024 0306   CALCIUM  9.0 08/03/2024 0306   PROT 5.9 (L) 08/01/2024 0448   ALBUMIN 3.0 (L) 08/03/2024 0306   AST 14 (L) 08/01/2024 0448   ALT 15 08/01/2024 0448   ALKPHOS 37 (L) 08/01/2024 0448   BILITOT 1.0 08/01/2024 0448   GFRNONAA >60 08/03/2024 0306   GFRAA 114 02/13/2009 0935   Lipase     Component Value Date/Time   LIPASE 41 07/31/2024 1059    Studies/Results: DG Abd 1 View Result Date:  08/01/2024 CLINICAL DATA:  Follow-up small bowel obstruction EXAM: ABDOMEN - 1 VIEW COMPARISON:  07/31/2024. FINDINGS: There are multiple loops of dilated small bowel consistent with a partial obstruction, without convincing change from the previous day's exams. IMPRESSION: 1. Persistent partial small bowel obstruction. Electronically Signed   By: Alm Parkins M.D.   On: 08/01/2024 10:32    Anti-infectives: Anti-infectives (From admission, onward)    Start     Dose/Rate Route Frequency Ordered Stop   08/02/24 1600  cefTRIAXone  (ROCEPHIN ) 2 g in sodium chloride  0.9 % 100 mL IVPB        2 g 200 mL/hr over 30 Minutes Intravenous Every 24 hours 08/02/24 0957     08/01/24 0300  ciprofloxacin  (CIPRO ) IVPB 400 mg  Status:  Discontinued        400 mg 200 mL/hr over 60 Minutes Intravenous Every 12 hours 07/31/24 1545 08/02/24 0957   08/01/24 0300  metroNIDAZOLE  (FLAGYL ) IVPB 500 mg        500 mg 100 mL/hr over 60 Minutes Intravenous Every 12 hours 07/31/24 1545     07/31/24 1400  ciprofloxacin  (CIPRO ) IVPB 400 mg        400 mg 200 mL/hr over 60 Minutes Intravenous  Once 07/31/24 1347 07/31/24  1620   07/31/24 1400  metroNIDAZOLE  (FLAGYL ) IVPB 500 mg        500 mg 100 mL/hr over 60 Minutes Intravenous  Once 07/31/24 1347 07/31/24 1547        Assessment/Plan Homar L Heist is an 68 y.o. male with short segment simple sigmoid diverticulitis and a small bowel obstruction.  - No indication for emergency surgery - No prior abdominal surgeries. Agree with Dr. Lyndel that question if an omental band scarred to his colon causing this presentation. Hopefully as his diverticulitis improves, his SBO will also improve. - Afebrile. No tachycardia or hypotension. WBC wnl  - Cont IV abx for diverticulitis - With n/v overnight keep NPO and check xray. If he has any return of abdominal pain, worsening distension or further n/v would place NGT. Could consider SBO protocol with gastrografin .  - Hopefully  improves with conservative management. If fails to improve, may need OR during admission. If improves with conservative management, would recommend follow up with GI to consider colonoscopy in 6 weeks given this is his first episode of diverticulitis  FEN - NPO as above, IVF per primary. Keep K >=4, Phos >= 3, Mg >= 2 VTE - SCDs, Lovenox . Mobilize for bowel function ID - Rocephin /Flagyl   HTN HLD DM OSA  I reviewed nursing notes, hospitalist notes, last 24 h vitals and pain scores, last 48 h intake and output, last 24 h labs and trends, and last 24 h imaging results.    LOS: 3 days    Brett Stanley, [facility] 08/03/2024, 8:57 AM Please see Amion for pager number during day hours 7:00am-4:30pm

## 2024-08-03 NOTE — Progress Notes (Signed)
 Sent MD and NP a message regarding pt vomiting. It was about 140cc and a dark green color. Provider responded with NPO and place NGT.

## 2024-08-03 NOTE — Progress Notes (Signed)
 Gave Pt medication See MAR. And after that the pt stated he felt so much better. He asked if we could wait a few hours to see how he felt before we placed the NGT. I sent the provider a message making him aware.

## 2024-08-03 NOTE — Progress Notes (Signed)
 Pt is currently NPO and we're holding off on the NGT until further notice from the provider.

## 2024-08-03 NOTE — Plan of Care (Signed)

## 2024-08-03 NOTE — Plan of Care (Signed)

## 2024-08-04 ENCOUNTER — Inpatient Hospital Stay (HOSPITAL_COMMUNITY)

## 2024-08-04 DIAGNOSIS — K5732 Diverticulitis of large intestine without perforation or abscess without bleeding: Secondary | ICD-10-CM | POA: Diagnosis not present

## 2024-08-04 DIAGNOSIS — Z4682 Encounter for fitting and adjustment of non-vascular catheter: Secondary | ICD-10-CM | POA: Diagnosis not present

## 2024-08-04 DIAGNOSIS — E66812 Obesity, class 2: Secondary | ICD-10-CM | POA: Diagnosis not present

## 2024-08-04 DIAGNOSIS — K56609 Unspecified intestinal obstruction, unspecified as to partial versus complete obstruction: Secondary | ICD-10-CM | POA: Diagnosis not present

## 2024-08-04 DIAGNOSIS — D72825 Bandemia: Secondary | ICD-10-CM | POA: Diagnosis not present

## 2024-08-04 LAB — RENAL FUNCTION PANEL
Albumin: 3.1 g/dL — ABNORMAL LOW (ref 3.5–5.0)
Anion gap: 17 — ABNORMAL HIGH (ref 5–15)
BUN: 11 mg/dL (ref 8–23)
CO2: 22 mmol/L (ref 22–32)
Calcium: 8.8 mg/dL — ABNORMAL LOW (ref 8.9–10.3)
Chloride: 100 mmol/L (ref 98–111)
Creatinine, Ser: 0.93 mg/dL (ref 0.61–1.24)
GFR, Estimated: >60 mL/min (ref >60)
Glucose, Bld: 153 mg/dL — ABNORMAL HIGH (ref 70–99)
Phosphorus: 3.8 mg/dL (ref 2.5–4.6)
Potassium: 4.4 mmol/L (ref 3.5–5.1)
Sodium: 139 mmol/L (ref 135–145)

## 2024-08-04 LAB — TYPE AND SCREEN
ABO/RH(D): A POS
Antibody Screen: NEGATIVE

## 2024-08-04 LAB — CBC
HCT: 44.2 % (ref 39.0–52.0)
Hemoglobin: 14.6 g/dL (ref 13.0–17.0)
MCH: 29.3 pg (ref 26.0–34.0)
MCHC: 33 g/dL (ref 30.0–36.0)
MCV: 88.6 fL (ref 80.0–100.0)
Platelets: 269 K/uL (ref 150–400)
RBC: 4.99 MIL/uL (ref 4.22–5.81)
RDW: 12.7 % (ref 11.5–15.5)
WBC: 9.6 K/uL (ref 4.0–10.5)
nRBC: 0 % (ref 0.0–0.2)

## 2024-08-04 LAB — GLUCOSE, CAPILLARY
Glucose-Capillary: 129 mg/dL — ABNORMAL HIGH (ref 70–99)
Glucose-Capillary: 156 mg/dL — ABNORMAL HIGH (ref 70–99)
Glucose-Capillary: 123 mg/dL — ABNORMAL HIGH (ref 70–99)
Glucose-Capillary: 142 mg/dL — ABNORMAL HIGH (ref 70–99)
Glucose-Capillary: 150 mg/dL — ABNORMAL HIGH (ref 70–99)

## 2024-08-04 LAB — MAGNESIUM: Magnesium: 1.8 mg/dL (ref 1.7–2.4)

## 2024-08-04 LAB — ABO/RH: ABO/RH(D): A POS

## 2024-08-04 MED ORDER — PHENOL 1.4 % MT LIQD
1.0000 | OROMUCOSAL | Status: DC | PRN
  Filled 2024-08-04: qty 177

## 2024-08-04 MED ORDER — DIATRIZOATE MEGLUMINE & SODIUM 66-10 % PO SOLN
90.0000 mL | Freq: Once | ORAL | Status: AC
  Administered 2024-08-04: 90 mL via NASOGASTRIC
  Filled 2024-08-04: qty 90

## 2024-08-04 MED ORDER — SODIUM CHLORIDE 0.9 % IV SOLN: INTRAVENOUS | Status: AC

## 2024-08-04 NOTE — Progress Notes (Signed)
 Subjective: CC: Reports he had ingestion and bloating last night followed by n/v.  NGT placed.  NGT not hooked up correctly. Fixed and bilious output drained.  He reports some central abdominal pain and bloating today. No nausea. No flatus or BM.    Objective: Vital signs in last 24 hours: Temp:  [97.7 F (36.5 C)-98.2 F (36.8 C)] 98.2 F (36.8 C) (08/21 0504) Pulse Rate:  [76-80] 80 (08/21 0504) Resp:  [16-18] 16 (08/21 0504) BP: (113-118)/(74-81) 113/79 (08/21 0504) SpO2:  [95 %-99 %] 95 % (08/21 0504) Last BM Date : 07/28/24  Intake/Output from previous day: 08/20 0701 - 08/21 0700 In: 2167.2 [I.V.:1745.9; IV Piggyback:421.3] Out: 450 [Urine:300; Emesis/NG output:150] Intake/Output this shift: No intake/output data recorded.  PE: Gen:  Alert, NAD, pleasant Abd: Soft, protuberant, moderate distension, central abdominal ttp without rigidity or guarding.   Lab Results:  Recent Labs    08/03/24 0306 08/04/24 0544  WBC 9.9 9.6  HGB 14.0 14.6  HCT 42.5 44.2  PLT 244 269   BMET Recent Labs    08/03/24 0306 08/04/24 0544  NA 134* 139  K 4.7 4.4  CL 98 100  CO2 26 22  GLUCOSE 163* 153*  BUN 7* 11  CREATININE 0.95 0.93  CALCIUM  9.0 8.8*   PT/INR No results for input(s): LABPROT, INR in the last 72 hours. CMP     Component Value Date/Time   NA 139 08/04/2024 0544   K 4.4 08/04/2024 0544   CL 100 08/04/2024 0544   CO2 22 08/04/2024 0544   GLUCOSE 153 (H) 08/04/2024 0544   BUN 11 08/04/2024 0544   CREATININE 0.93 08/04/2024 0544   CALCIUM  8.8 (L) 08/04/2024 0544   PROT 5.9 (L) 08/01/2024 0448   ALBUMIN 3.1 (L) 08/04/2024 0544   AST 14 (L) 08/01/2024 0448   ALT 15 08/01/2024 0448   ALKPHOS 37 (L) 08/01/2024 0448   BILITOT 1.0 08/01/2024 0448   GFRNONAA >60 08/04/2024 0544   GFRAA 114 02/13/2009 0935   Lipase     Component Value Date/Time   LIPASE 41 07/31/2024 1059    Studies/Results: DG Abd Portable 1V-Small Bowel Obstruction  Protocol-initial, 8 hr delay Result Date: 08/04/2024 CLINICAL DATA:  Admitted for small bowel obstruction, evaluate 8 hours following oral contrast. EXAM: PORTABLE ABDOMEN - 1 VIEW COMPARISON:  Flat plate exam yesterday at 10:21 a.m. FINDINGS: 1:36 a.m. The stomach is moderately dilated and filled with dilute contrast. Contrast faintly opacifies dilated bowel segments in the mid to lower abdomen, little if any change noted with maximal small bowel caliber of 4.3 cm which is similar to the CT from 07/31/2024. No contrast is seen in the colon. No colonic dilatation. There is scattered gas and stool in the colon at least as far as the mid descending segment. IMPRESSION: Contrast faintly opacifies dilated small bowel segments in the mid to lower abdomen, little if any change noted with maximal small bowel caliber of 4.3 cm which is similar to the CT from 07/31/2024. No contrast is seen in the colon. Electronically Signed   By: Francis Quam M.D.   On: 08/04/2024 02:18   DG Abd 1 View Result Date: 08/03/2024 CLINICAL DATA:  Vomiting. EXAM: ABDOMEN - 1 VIEW COMPARISON:  August 01, 2024. FINDINGS: Mild small bowel dilatation is noted concerning for ileus or distal small bowel obstruction. No colonic dilatation is noted. No radio-opaque calculi or other significant radiographic abnormality are seen. IMPRESSION: Mild small bowel dilatation  is noted concerning for ileus or distal small bowel obstruction. Electronically Signed   By: Lynwood Landy Raddle M.D.   On: 08/03/2024 11:18    Anti-infectives: Anti-infectives (From admission, onward)    Start     Dose/Rate Route Frequency Ordered Stop   08/02/24 1600  cefTRIAXone  (ROCEPHIN ) 2 g in sodium chloride  0.9 % 100 mL IVPB        2 g 200 mL/hr over 30 Minutes Intravenous Every 24 hours 08/02/24 0957     08/01/24 0300  ciprofloxacin  (CIPRO ) IVPB 400 mg  Status:  Discontinued        400 mg 200 mL/hr over 60 Minutes Intravenous Every 12 hours 07/31/24 1545 08/02/24 0957    08/01/24 0300  metroNIDAZOLE  (FLAGYL ) IVPB 500 mg        500 mg 100 mL/hr over 60 Minutes Intravenous Every 12 hours 07/31/24 1545     07/31/24 1400  ciprofloxacin  (CIPRO ) IVPB 400 mg        400 mg 200 mL/hr over 60 Minutes Intravenous  Once 07/31/24 1347 07/31/24 1620   07/31/24 1400  metroNIDAZOLE  (FLAGYL ) IVPB 500 mg        500 mg 100 mL/hr over 60 Minutes Intravenous  Once 07/31/24 1347 07/31/24 1547        Assessment/Plan Brett Stanley is an 68 y.o. male with short segment simple sigmoid diverticulitis and a small bowel obstruction.  - No prior abdominal surgeries. Unclear if SBO is related to diverticulitis - No indication for emergency surgery - Afebrile. No tachycardia or hypotension. WBC wnl  - Cont IV abx for diverticulitis - N/v overnight. NGT placed. Will allow him to decompress for a few hours and then consider SBO protocol with gastrografin  vs repeat CT w/ PO contrast via NGT.  - If not improving in the next 24 hours, will need to consider OR to evaluate SBO.    FEN - NPO, NGT to LIWS, IVF per primary. Keep K >=4, Phos >= 3, Mg >= 2 VTE - SCDs, Lovenox . Mobilize for bowel function. Okay to clamp NGT for mobilization ID - Rocephin /Flagyl    HTN HLD DM OSA  I reviewed nursing notes, hospitalist notes, last 24 h vitals and pain scores, last 48 h intake and output, last 24 h labs and trends, and last 24 h imaging results.    LOS: 4 days    Ozell CHRISTELLA Shaper, [facility] Surgery 08/04/2024, 8:14 AM Please see Amion for pager number during day hours 7:00am-4:30pm

## 2024-08-04 NOTE — Plan of Care (Signed)
  Problem: Coping: Goal: Ability to adjust to condition or change in health will improve Outcome: Progressing   Problem: Fluid Volume: Goal: Ability to maintain a balanced intake and output will improve Outcome: Progressing   Problem: Health Behavior/Discharge Planning: Goal: Ability to identify and utilize available resources and services will improve Outcome: Progressing   Problem: Nutritional: Goal: Maintenance of adequate nutrition will improve Outcome: Not Progressing

## 2024-08-04 NOTE — Progress Notes (Signed)
 Pt experienced vomiting again this morning, about 150cc. Zofran  was given and provider was notified. NGT order was placed and NGT was placed at 0515 by Domnick, RN. KUB was ordered and verification of placement was performed.

## 2024-08-04 NOTE — Progress Notes (Signed)
 PA Maczis alerted this nurse to come to [phone] and discovered the NG tube connection was not set up properly. This nurse set up connection with new tubing and NG tube is now suctioning properly. Output has been charted.

## 2024-08-04 NOTE — Progress Notes (Signed)
     Patient Name: KAMRIN SIBLEY           DOB: 24-Mar-1956  MRN: [id number]      Admission Date: 07/31/2024  Attending Provider: Kathrin Mignon DASEN, MD  Primary Diagnosis: SBO (small bowel obstruction) (HCC)   Level of care: Med-Surg   OVERNIGHT EVENT   Patient has had 2 large episodes of emesis (2 full emesis bags per RN) within 10 minutes, little improvement with Zofran .   Orders placed for NG tube placement for relief.     Arkin Imran, DNP, ACNPC- AG [facility]

## 2024-08-04 NOTE — Progress Notes (Signed)
 Contrast has been administered per NG tube. NG tube clamped after administration per order. Radiology was called to confirm admin time.

## 2024-08-04 NOTE — Plan of Care (Signed)

## 2024-08-04 NOTE — Progress Notes (Signed)
 PROGRESS NOTE  Brett Stanley FMW:[id number] DOB: Jul 26, 1956   PCP: [name], MD  Patient is from: Home.  Lives with his wife.  Independently ambulates at baseline.  DOA: 07/31/2024 LOS: 4  Chief complaints Chief Complaint  Patient presents with   Diarrhea     Brief Narrative / Interim history: 68 year old M with PMH of OSA not on CPAP, DM-2, HTN and morbid obesity presenting with abdominal pain and distention for about 5 days, and admitted with small bowel obstruction and sigmoid diverticulitis.  Recently diagnosed with viral gastroenteritis after he presented there with abdominal discomfort, diarrhea and vomiting.  No prior abdominal surgery.  Colonoscopy in 2021 with two 8 to 10 mm polyps.  CT abdomen and pelvis showed SBO with transition point in right hemiabdomen and uncomplicated acute sigmoid diverticulitis without perforation.  Patient was started on IV antibiotics.  Surgery consulted and following.  Patient had a setback after initial improvement and required NG tube placement on 8/21.  Subjective: Seen and examined earlier this morning.  Patient had 2 large emesis last night.  NG tube placed.  SBO protocol underway.  No bowel movement or flatus yet.  Objective: Vitals:   08/03/24 1701 08/03/24 1958 08/04/24 0504 08/04/24 0829  BP: 118/74 114/78 113/79 (!) 109/91  Pulse: 80 79 80 (!) 102  Resp: 18 16 16 17   Temp: 97.7 F (36.5 C) 98 F (36.7 C) 98.2 F (36.8 C) 98.2 F (36.8 C)  TempSrc: Oral   Oral  SpO2: 98% 99% 95% 95%  Weight:      Height:        Examination:  GENERAL: No apparent distress.  Nontoxic. HEENT: MMM.  Vision and hearing grossly intact.  NGT in place. NECK: Supple.  No apparent JVD.  RESP:  No IWOB.  Fair aeration bilaterally. CVS:  RRR. Heart sounds normal.  ABD/GI/GU: BS+. Abd soft and nontender. MSK/EXT:  Moves extremities. No apparent deformity. No edema.  SKIN: no apparent skin lesion or wound NEURO: AA.  Oriented appropriately.   No apparent focal neuro deficit. PSYCH: Calm. Normal affect.   Consultants:  General surgery  Procedures: None  Microbiology summarized: None  Assessment and plan: Small bowel obstruction: Presents with abdominal pain and distention preceded by nausea, vomiting and diarrhea earlier in the week.  CT showed SBO with transition point in right hemiabdomen.  Had setback after initial improvement requiring NG tube placement. -Appreciate help by general surgery-n.p.o., NGT to LIWS and SBO protocol -Continue IV Protonix  40 mg twice daily for indigestion -Antiemetics as needed -Continue IV fluid  Acute sigmoid diverticulitis: Nontender.  Leukocytosis resolved. - Cipro  and Flagyl >> ceftriaxone  and Flagyl   NIDDM-2 with hyperglycemia: A1c 7.9% in 03/2024.  On metformin , glipizide  and Ozempic . Recent Labs  Lab 08/03/24 1701 08/03/24 1954 08/04/24 0157 08/04/24 0632 08/04/24 1204  GLUCAP 132* 117* 129* 156* 123*  -SSI-very sensitive every 6 hours  Essential hypertension: Normotensive -Continue holding home lisinopril   Hyponatremia: Mild - Monitor  Leukocytosis: Likely due to diverticulitis.  Resolved  History of OSA: Not on CPAP  Hypokalemia -Monitor replenish K and Mg as appropriate   Morbid obesity: Elevated BMI with diabetes and hypertension. Body mass index is 38.4 kg/m.          DVT prophylaxis:  enoxaparin  (LOVENOX ) injection 40 mg Start: 08/01/24 1000  Code Status: Full code Family Communication: None at bedside. Level of care: Med-Surg Status is: Inpatient Remains inpatient appropriate because: SBO    Final disposition: Home  35 minutes with more than 50% spent in reviewing records, counseling patient/family and coordinating care.   Sch Meds:  Scheduled Meds:  enoxaparin  (LOVENOX ) injection  40 mg Subcutaneous Q24H   insulin  aspart  0-6 Units Subcutaneous Q6H   pantoprazole  (PROTONIX ) IV  40 mg Intravenous Q12H   sodium chloride  flush  3 mL  Intravenous Q12H   Continuous Infusions:  cefTRIAXone  (ROCEPHIN )  IV Stopped (08/03/24 1636)   metronidazole  Stopped (08/04/24 0421)   PRN Meds:.acetaminophen  **OR** acetaminophen , albuterol , hydrALAZINE , morphine  injection, ondansetron  **OR** ondansetron  (ZOFRAN ) IV, phenol  Antimicrobials: Anti-infectives (From admission, onward)    Start     Dose/Rate Route Frequency Ordered Stop   08/02/24 1600  cefTRIAXone  (ROCEPHIN ) 2 g in sodium chloride  0.9 % 100 mL IVPB        2 g 200 mL/hr over 30 Minutes Intravenous Every 24 hours 08/02/24 0957     08/01/24 0300  ciprofloxacin  (CIPRO ) IVPB 400 mg  Status:  Discontinued        400 mg 200 mL/hr over 60 Minutes Intravenous Every 12 hours 07/31/24 1545 08/02/24 0957   08/01/24 0300  metroNIDAZOLE  (FLAGYL ) IVPB 500 mg        500 mg 100 mL/hr over 60 Minutes Intravenous Every 12 hours 07/31/24 1545     07/31/24 1400  ciprofloxacin  (CIPRO ) IVPB 400 mg        400 mg 200 mL/hr over 60 Minutes Intravenous  Once 07/31/24 1347 07/31/24 1620   07/31/24 1400  metroNIDAZOLE  (FLAGYL ) IVPB 500 mg        500 mg 100 mL/hr over 60 Minutes Intravenous  Once 07/31/24 1347 07/31/24 1547        I have personally reviewed the following labs and images: CBC: Recent Labs  Lab 07/31/24 1059 08/01/24 0448 08/02/24 1000 08/03/24 0306 08/04/24 0544  WBC 11.6* 9.9 8.7 9.9 9.6  HGB 15.8 14.0 14.4 14.0 14.6  HCT 47.3 41.4 42.4 42.5 44.2  MCV 85.8 86.3 86.7 86.9 88.6  PLT 282 224 237 244 269   BMP &GFR Recent Labs  Lab 07/31/24 1059 08/01/24 0448 08/02/24 1000 08/03/24 0306 08/04/24 0544  NA 133* 135 134* 134* 139  K 3.6 3.3* 4.1 4.7 4.4  CL 100 104 103 98 100  CO2 20* 23 20* 26 22  GLUCOSE 194* 90 132* 163* 153*  BUN 12 12 9  7* 11  CREATININE 1.04 0.84 0.91 0.95 0.93  CALCIUM  9.4 8.5* 8.7* 9.0 8.8*  MG  --  1.8 1.8 1.8 1.8  PHOS  --   --  3.7 3.3 3.8   Estimated Creatinine Clearance: 99 mL/min (by C-G formula based on SCr of 0.93  mg/dL). Liver & Pancreas: Recent Labs  Lab 07/31/24 1059 08/01/24 0448 08/02/24 1000 08/03/24 0306 08/04/24 0544  AST 20 14*  --   --   --   ALT 16 15  --   --   --   ALKPHOS 48 37*  --   --   --   BILITOT 1.3* 1.0  --   --   --   PROT 7.1 5.9*  --   --   --   ALBUMIN 3.3* 2.7* 3.1* 3.0* 3.1*   Recent Labs  Lab 07/31/24 1059  LIPASE 41   No results for input(s): AMMONIA in the last 168 hours. Diabetic: No results for input(s): HGBA1C in the last 72 hours. Recent Labs  Lab 08/03/24 1701 08/03/24 1954 08/04/24 0157 08/04/24 0632 08/04/24 1204  GLUCAP 132*  117* 129* 156* 123*   Cardiac Enzymes: No results for input(s): CKTOTAL, CKMB, CKMBINDEX, TROPONINI in the last 168 hours. No results for input(s): PROBNP in the last 8760 hours. Coagulation Profile: No results for input(s): INR, PROTIME in the last 168 hours. Thyroid Function Tests: No results for input(s): TSH, T4TOTAL, FREET4, T3FREE, THYROIDAB in the last 72 hours. Lipid Profile: No results for input(s): CHOL, HDL, LDLCALC, TRIG, CHOLHDL, LDLDIRECT in the last 72 hours. Anemia Panel: No results for input(s): VITAMINB12, FOLATE, FERRITIN, TIBC, IRON, RETICCTPCT in the last 72 hours. Urine analysis:    Component Value Date/Time   COLORURINE AMBER (A) 07/31/2024 1052   APPEARANCEUR HAZY (A) 07/31/2024 1052   LABSPEC 1.035 (H) 07/31/2024 1052   PHURINE 5.0 07/31/2024 1052   GLUCOSEU >=500 (A) 07/31/2024 1052   HGBUR NEGATIVE 07/31/2024 1052   BILIRUBINUR SMALL (A) 07/31/2024 1052   BILIRUBINUR moderate (A) 07/31/2024 0907   KETONESUR 5 (A) 07/31/2024 1052   KETONESUR trace (5) (A) 07/31/2024 0907   PROTEINUR 100 (A) 07/31/2024 1052   UROBILINOGEN 1.0 07/31/2024 0907   NITRITE NEGATIVE 07/31/2024 1052   NITRITE Negative 07/31/2024 0907   LEUKOCYTESUR NEGATIVE 07/31/2024 1052   LEUKOCYTESUR Negative 07/31/2024 0907   Sepsis Labs: Invalid input(s):  PROCALCITONIN, LACTICIDVEN  Microbiology: No results found for this or any previous visit (from the past 240 hours).  Radiology Studies: DG Abd 1 View Result Date: 08/04/2024 EXAM: 1 VIEW XRAY OF THE ABDOMEN 08/04/2024 05:52:11 AM COMPARISON: Abdominal series dated 08/04/2024. CLINICAL HISTORY: Encounter for nasogastric (NG) tube placement. FINDINGS: BOWEL: Nonobstructive bowel gas pattern. SOFT TISSUES: No opaque urinary calculi. BONES: No acute osseous abnormality. LINES AND TUBES: Since the previous study, a gastric tube has been placed and its tip is along the greater curvature of the mid body of the stomach. The side port of the catheter appears to be just beneath the gastroesophageal junction. IMPRESSION: 1. Gastric tube in appropriate position with tip along the greater curvature of the mid body of the stomach and side port just beneath the gastroesophageal junction. Electronically signed by: Evalene Coho MD 08/04/2024 08:47 AM EDT RP Workstation: [id number]   DG Abd Portable 1V-Small Bowel Obstruction Protocol-initial, 8 hr delay Result Date: 08/04/2024 CLINICAL DATA:  Admitted for small bowel obstruction, evaluate 8 hours following oral contrast. EXAM: PORTABLE ABDOMEN - 1 VIEW COMPARISON:  Flat plate exam yesterday at 10:21 a.m. FINDINGS: 1:36 a.m. The stomach is moderately dilated and filled with dilute contrast. Contrast faintly opacifies dilated bowel segments in the mid to lower abdomen, little if any change noted with maximal small bowel caliber of 4.3 cm which is similar to the CT from 07/31/2024. No contrast is seen in the colon. No colonic dilatation. There is scattered gas and stool in the colon at least as far as the mid descending segment. IMPRESSION: Contrast faintly opacifies dilated small bowel segments in the mid to lower abdomen, little if any change noted with maximal small bowel caliber of 4.3 cm which is similar to the CT from 07/31/2024. No contrast is seen in the  colon. Electronically Signed   By: Francis Quam M.D.   On: 08/04/2024 02:18       Faren Florence T. Jaionna Weisse [facility]  If 7PM-7AM, please contact night-coverage [email or web address] 08/04/2024, 1:28 PM

## 2024-08-04 NOTE — Progress Notes (Signed)
 Radtech at bedside for xray

## 2024-08-04 NOTE — Progress Notes (Signed)
 Rad tech at bedside for NGT placement verification.

## 2024-08-05 ENCOUNTER — Encounter (HOSPITAL_COMMUNITY)
Admission: EM | Disposition: A | Discharge status: Home / Self Care | Payer: Self-pay | Source: Home / Self Care | Attending: Student

## 2024-08-05 ENCOUNTER — Other Ambulatory Visit: Payer: Self-pay

## 2024-08-05 ENCOUNTER — Inpatient Hospital Stay (HOSPITAL_COMMUNITY)

## 2024-08-05 ENCOUNTER — Encounter (HOSPITAL_COMMUNITY): Payer: Self-pay | Admitting: Internal Medicine

## 2024-08-05 ENCOUNTER — Inpatient Hospital Stay (HOSPITAL_COMMUNITY): Admitting: Certified Registered Nurse Anesthetist

## 2024-08-05 DIAGNOSIS — D72825 Bandemia: Secondary | ICD-10-CM | POA: Diagnosis not present

## 2024-08-05 DIAGNOSIS — E7800 Pure hypercholesterolemia, unspecified: Secondary | ICD-10-CM | POA: Diagnosis not present

## 2024-08-05 DIAGNOSIS — K565 Intestinal adhesions [bands], unspecified as to partial versus complete obstruction: Secondary | ICD-10-CM

## 2024-08-05 DIAGNOSIS — K5732 Diverticulitis of large intestine without perforation or abscess without bleeding: Secondary | ICD-10-CM | POA: Diagnosis not present

## 2024-08-05 DIAGNOSIS — I10 Essential (primary) hypertension: Secondary | ICD-10-CM

## 2024-08-05 DIAGNOSIS — K5792 Diverticulitis of intestine, part unspecified, without perforation or abscess without bleeding: Secondary | ICD-10-CM

## 2024-08-05 DIAGNOSIS — E66812 Obesity, class 2: Secondary | ICD-10-CM | POA: Diagnosis not present

## 2024-08-05 DIAGNOSIS — K56609 Unspecified intestinal obstruction, unspecified as to partial versus complete obstruction: Secondary | ICD-10-CM | POA: Diagnosis not present

## 2024-08-05 HISTORY — PX: LAPAROSCOPY: SHX197

## 2024-08-05 LAB — RENAL FUNCTION PANEL
Albumin: 3.1 g/dL — ABNORMAL LOW (ref 3.5–5.0)
Anion gap: 16 — ABNORMAL HIGH (ref 5–15)
BUN: 12 mg/dL (ref 8–23)
CO2: 19 mmol/L — ABNORMAL LOW (ref 22–32)
Calcium: 8.5 mg/dL — ABNORMAL LOW (ref 8.9–10.3)
Chloride: 106 mmol/L (ref 98–111)
Creatinine, Ser: 0.99 mg/dL (ref 0.61–1.24)
GFR, Estimated: >60 mL/min (ref >60)
Glucose, Bld: 174 mg/dL — ABNORMAL HIGH (ref 70–99)
Phosphorus: 3.6 mg/dL (ref 2.5–4.6)
Potassium: 4.4 mmol/L (ref 3.5–5.1)
Sodium: 141 mmol/L (ref 135–145)

## 2024-08-05 LAB — GLUCOSE, CAPILLARY
Glucose-Capillary: 110 mg/dL — ABNORMAL HIGH (ref 70–99)
Glucose-Capillary: 127 mg/dL — ABNORMAL HIGH (ref 70–99)
Glucose-Capillary: 127 mg/dL — ABNORMAL HIGH (ref 70–99)
Glucose-Capillary: 156 mg/dL — ABNORMAL HIGH (ref 70–99)
Glucose-Capillary: 169 mg/dL — ABNORMAL HIGH (ref 70–99)
Glucose-Capillary: 137 mg/dL — ABNORMAL HIGH (ref 70–99)

## 2024-08-05 LAB — MAGNESIUM: Magnesium: 1.8 mg/dL (ref 1.7–2.4)

## 2024-08-05 LAB — CBC
HCT: 47.1 % (ref 39.0–52.0)
Hemoglobin: 14.9 g/dL (ref 13.0–17.0)
MCH: 29.3 pg (ref 26.0–34.0)
MCHC: 31.6 g/dL (ref 30.0–36.0)
MCV: 92.7 fL (ref 80.0–100.0)
Platelets: 216 K/uL (ref 150–400)
RBC: 5.08 MIL/uL (ref 4.22–5.81)
RDW: 12.8 % (ref 11.5–15.5)
WBC: 12.4 K/uL — ABNORMAL HIGH (ref 4.0–10.5)
nRBC: 0 % (ref 0.0–0.2)

## 2024-08-05 LAB — SURGICAL PCR SCREEN
MRSA, PCR: NEGATIVE
Staphylococcus aureus: NEGATIVE

## 2024-08-05 SURGERY — LAPAROSCOPY, DIAGNOSTIC
Anesthesia: General | Site: Abdomen

## 2024-08-05 MED ORDER — SUGAMMADEX SODIUM 200 MG/2ML IV SOLN
INTRAVENOUS | Status: DC | PRN
  Administered 2024-08-05: 235.8 mg via INTRAVENOUS

## 2024-08-05 MED ORDER — BUPIVACAINE-EPINEPHRINE (PF) 0.25% -1:200000 IJ SOLN
INTRAMUSCULAR | Status: AC
Start: 2024-08-05 — End: 2024-08-05
  Filled 2024-08-05: qty 30

## 2024-08-05 MED ORDER — CHLORHEXIDINE GLUCONATE 0.12 % MT SOLN
OROMUCOSAL | Status: AC
  Administered 2024-08-05: 15 mL via OROMUCOSAL
  Filled 2024-08-05: qty 15

## 2024-08-05 MED ORDER — ONDANSETRON HCL 4 MG/2ML IJ SOLN
INTRAMUSCULAR | Status: DC | PRN
  Administered 2024-08-05: 4 mg via INTRAVENOUS

## 2024-08-05 MED ORDER — LIDOCAINE 2% (20 MG/ML) 5 ML SYRINGE
INTRAMUSCULAR | Status: DC | PRN
  Administered 2024-08-05: 100 mg via INTRAVENOUS

## 2024-08-05 MED ORDER — SUCCINYLCHOLINE CHLORIDE 200 MG/10ML IV SOSY
PREFILLED_SYRINGE | INTRAVENOUS | Status: DC | PRN
  Administered 2024-08-05: 120 mg via INTRAVENOUS

## 2024-08-05 MED ORDER — SODIUM CHLORIDE 0.9 % IR SOLN
Status: DC | PRN
  Administered 2024-08-05: 1

## 2024-08-05 MED ORDER — 0.9 % SODIUM CHLORIDE (POUR BTL) OPTIME
TOPICAL | Status: DC | PRN
  Administered 2024-08-05: 1000 mL

## 2024-08-05 MED ORDER — PROPOFOL 10 MG/ML IV BOLUS
INTRAVENOUS | Status: DC | PRN
  Administered 2024-08-05: 150 mg via INTRAVENOUS

## 2024-08-05 MED ORDER — PHENYLEPHRINE HCL-NACL 20-0.9 MG/250ML-% IV SOLN
INTRAVENOUS | Status: DC | PRN
Start: 2024-08-05 — End: 2024-08-05
  Administered 2024-08-05: 40 ug/min via INTRAVENOUS

## 2024-08-05 MED ORDER — ROCURONIUM BROMIDE 10 MG/ML (PF) SYRINGE
PREFILLED_SYRINGE | INTRAVENOUS | Status: DC | PRN
  Administered 2024-08-05 (×2): 50 mg via INTRAVENOUS

## 2024-08-05 MED ORDER — AMISULPRIDE (ANTIEMETIC) 5 MG/2ML IV SOLN: 10.0000 mg | Freq: Once | INTRAVENOUS | Status: DC | PRN

## 2024-08-05 MED ORDER — FENTANYL CITRATE (PF) 250 MCG/5ML IJ SOLN
INTRAMUSCULAR | Status: AC
  Filled 2024-08-05: qty 5

## 2024-08-05 MED ORDER — SODIUM CHLORIDE 0.9 % IV SOLN: 12.5000 mg | INTRAVENOUS | Status: DC | PRN

## 2024-08-05 MED ORDER — CHLORHEXIDINE GLUCONATE 0.12 % MT SOLN: 15.0000 mL | Freq: Once | OROMUCOSAL | Status: AC

## 2024-08-05 MED ORDER — ORAL CARE MOUTH RINSE: 15.0000 mL | Freq: Once | OROMUCOSAL | Status: AC

## 2024-08-05 MED ORDER — BUPIVACAINE-EPINEPHRINE 0.25% -1:200000 IJ SOLN
INTRAMUSCULAR | Status: DC | PRN
  Administered 2024-08-05: 28 mL

## 2024-08-05 MED ORDER — DEXAMETHASONE SODIUM PHOSPHATE 10 MG/ML IJ SOLN
INTRAMUSCULAR | Status: DC | PRN
  Administered 2024-08-05: 10 mg via INTRAVENOUS

## 2024-08-05 MED ORDER — HYDROMORPHONE HCL 1 MG/ML IJ SOLN
INTRAMUSCULAR | Status: AC
  Filled 2024-08-05: qty 1

## 2024-08-05 MED ORDER — FENTANYL CITRATE (PF) 250 MCG/5ML IJ SOLN
INTRAMUSCULAR | Status: DC | PRN
  Administered 2024-08-05 (×4): 50 ug via INTRAVENOUS

## 2024-08-05 MED ORDER — LACTATED RINGERS IV SOLN: INTRAVENOUS | Status: DC | PRN

## 2024-08-05 MED ORDER — HYDROMORPHONE HCL 1 MG/ML IJ SOLN
0.2500 mg | INTRAMUSCULAR | Status: DC | PRN
  Administered 2024-08-05 (×2): 0.5 mg via INTRAVENOUS

## 2024-08-05 MED ORDER — LACTATED RINGERS IV SOLN: INTRAVENOUS | Status: DC

## 2024-08-05 SURGICAL SUPPLY — 30 items
BLADE CLIPPER SURG (BLADE) ×1 IMPLANT
CANISTER SUCTION 3000ML PPV (SUCTIONS) ×2 IMPLANT
CHLORAPREP W/TINT 26 (MISCELLANEOUS) ×2 IMPLANT
COVER SURGICAL LIGHT HANDLE (MISCELLANEOUS) ×2 IMPLANT
DERMABOND ADVANCED .7 DNX12 (GAUZE/BANDAGES/DRESSINGS) ×1 IMPLANT
DRAPE WARM FLUID 44X44 (DRAPES) ×2 IMPLANT
ELECT CAUTERY BLADE 6.4 (BLADE) ×2 IMPLANT
ELECTRODE REM PT RTRN 9FT ADLT (ELECTROSURGICAL) ×2 IMPLANT
GLOVE BIO SURGEON STRL SZ7 (GLOVE) ×2 IMPLANT
GLOVE BIOGEL PI IND STRL 7.5 (GLOVE) ×2 IMPLANT
GOWN STRL REUS W/ TWL LRG LVL3 (GOWN DISPOSABLE) ×4 IMPLANT
GOWN STRL REUS W/ TWL XL LVL3 (GOWN DISPOSABLE) ×2 IMPLANT
IRRIGATION SUCT STRKRFLW 2 WTP (MISCELLANEOUS) ×1 IMPLANT
KIT BASIN OR (CUSTOM PROCEDURE TRAY) ×2 IMPLANT
KIT TURNOVER KIT B (KITS) ×2 IMPLANT
NDL INSUFFLATION 14GA 120MM (NEEDLE) ×1 IMPLANT
NEEDLE INSUFFLATION 14GA 120MM (NEEDLE) ×2 IMPLANT
NS IRRIG 1000ML POUR BTL (IV SOLUTION) ×3 IMPLANT
PACK COLON (CUSTOM PROCEDURE TRAY) ×1 IMPLANT
PAD ARMBOARD POSITIONER FOAM (MISCELLANEOUS) ×3 IMPLANT
PENCIL BUTTON HOLSTER BLD 10FT (ELECTRODE) ×1 IMPLANT
SET TUBE SMOKE EVAC HIGH FLOW (TUBING) ×2 IMPLANT
SLEEVE Z-THREAD 5X100MM (TROCAR) ×4 IMPLANT
SUT MNCRL AB 4-0 PS2 18 (SUTURE) ×3 IMPLANT
SUT VICRYL 0 UR6 27IN ABS (SUTURE) ×1 IMPLANT
TOWEL GREEN STERILE (TOWEL DISPOSABLE) ×2 IMPLANT
TRAY FOLEY MTR SLVR 16FR STAT (SET/KITS/TRAYS/PACK) ×1 IMPLANT
TROCAR BALLN 12MMX100 BLUNT (TROCAR) ×1 IMPLANT
TROCAR Z-THREAD OPTICAL 5X100M (TROCAR) ×2 IMPLANT
WARMER LAPAROSCOPE (MISCELLANEOUS) ×2 IMPLANT

## 2024-08-05 NOTE — Anesthesia Preprocedure Evaluation (Signed)
 Anesthesia Evaluation  Patient identified by MRN, date of birth, ID band Patient awake    Reviewed: Allergy & Precautions, H&P , NPO status , Patient's Chart, lab work & pertinent test results  Airway Mallampati: II  TM Distance: >3 FB Neck ROM: Full    Dental  (+) Dental Advisory Given   Pulmonary sleep apnea    Pulmonary exam normal breath sounds clear to auscultation       Cardiovascular hypertension, Pt. on medications negative cardio ROS Normal cardiovascular exam Rhythm:Regular Rate:Normal     Neuro/Psych negative neurological ROS  negative psych ROS   GI/Hepatic Neg liver ROS,GERD  ,,  Endo/Other  negative endocrine ROSdiabetes, Type 2    Renal/GU negative Renal ROS  negative genitourinary   Musculoskeletal negative musculoskeletal ROS (+)    Abdominal  (+) + obese  Peds negative pediatric ROS (+)  Hematology negative hematology ROS (+)   Anesthesia Other Findings Small Bowel Obstruction  Reproductive/Obstetrics negative OB ROS                              Anesthesia Physical Anesthesia Plan  ASA: 3  Anesthesia Plan: General   Post-op Pain Management:    Induction: Intravenous, Rapid sequence and Cricoid pressure planned  PONV Risk Score and Plan: 2 and Ondansetron , Midazolam and Treatment may vary due to age or medical condition  Airway Management Planned: Oral ETT  Additional Equipment:   Intra-op Plan:   Post-operative Plan: Extubation in OR  Informed Consent: I have reviewed the patients History and Physical, chart, labs and discussed the procedure including the risks, benefits and alternatives for the proposed anesthesia with the patient or authorized representative who has indicated his/her understanding and acceptance.     Dental advisory given  Plan Discussed with: CRNA  Anesthesia Plan Comments:         Anesthesia Quick Evaluation

## 2024-08-05 NOTE — Progress Notes (Signed)
 Patient called nurse into room to look at NG tube, patient stated I think this got pulled out some while I was sleeping. NG tube measured 33 cm, original placement was 55 cm. NG tube was advanced to 55 cm. Patient tolerated well, NG tube currently clamped, awaiting placement verification. Order placed for STAT ABD view to verify placement. Kathrin, MD and Polly, MD made aware.

## 2024-08-05 NOTE — Progress Notes (Signed)
 PROGRESS NOTE  Brett Stanley FMW:[id number] DOB: Aug 21, 1956   PCP: [name], MD  Patient is from: Home.  Lives with his wife.  Independently ambulates at baseline.  DOA: 07/31/2024 LOS: 5  Chief complaints Chief Complaint  Patient presents with   Diarrhea     Brief Narrative / Interim history: 68 year old M with PMH of OSA not on CPAP, DM-2, HTN and morbid obesity presenting with abdominal pain and distention for about 5 days, and admitted with small bowel obstruction and sigmoid diverticulitis.  Recently diagnosed with viral gastroenteritis after he presented there with abdominal discomfort, diarrhea and vomiting.  No prior abdominal surgery.  Colonoscopy in 2021 with two 8 to 10 mm polyps.  CT abdomen and pelvis showed SBO with transition point in right hemiabdomen and uncomplicated acute sigmoid diverticulitis without perforation.  Patient was started on IV antibiotics.  Surgery consulted and following.  Patient had a setback after initial improvement and required NG tube placement on 8/21.  After he failed conservative intervention, he underwent diagnostic laparoscopy on 8/22 that showed mildly dilated small bowel without a clear transition point but few flimsy adhesions in RLQ.  Subjective: No major events overnight of this morning.  He underwent diagnostic laparoscopy that showed mildly dilated small bowel with thick clear transition point but few flimsy adhesions in RLQ.  Objective: Vitals:   08/05/24 1015 08/05/24 1030 08/05/24 1034 08/05/24 1149  BP: 130/77 125/77  132/85  Pulse: 76 76 76 87  Resp: 15 17 17 17   Temp:   97.7 F (36.5 C)   TempSrc:      SpO2: 90% 95% 96% 92%  Weight:      Height:        Examination:  GENERAL: No apparent distress.  Nontoxic. HEENT: MMM.  Vision and hearing grossly intact.  NGT in place. NECK: Supple.  No apparent JVD.  RESP:  No IWOB.  Fair aeration bilaterally. CVS:  RRR. Heart sounds normal.  ABD/GI/GU: BS+. Abd soft and  appropriately tender.  Dressing DCI. MSK/EXT:  Moves extremities. No apparent deformity. No edema.  SKIN: no apparent skin lesion or wound NEURO: AA.  Oriented appropriately.  No apparent focal neuro deficit. PSYCH: Calm. Normal affect.   Consultants:  General surgery  Procedures: 8/22-diagnostic laparoscopy  Microbiology summarized: None  Assessment and plan: Small bowel obstruction: Presents with abdominal pain and distention preceded by nausea, vomiting and diarrhea earlier in the week.  CT showed SBO with transition point in right hemiabdomen.  Had setback after initial improvement requiring NG tube placement.  Failed conservative measures. -S/p diagnostic laparoscopy by Dr. Polly on 8/22 -Appreciate help by general surgery-n.p.o., NGT -Continue IV Protonix  40 mg twice daily for indigestion -Antiemetics as needed -Continue IV fluid  Acute sigmoid diverticulitis: Nontender.  Leukocytosis resolved. - Cipro  and Flagyl >> ceftriaxone  and Flagyl   NIDDM-2 with hyperglycemia: A1c 7.9% in 03/2024.  On metformin , glipizide  and Ozempic . Recent Labs  Lab 08/04/24 2033 08/05/24 0500 08/05/24 0649 08/05/24 0925 08/05/24 1151  GLUCAP 150* 110* 127* 127* 156*  -SSI-very sensitive every 6 hours  Essential hypertension: Normotensive -Continue holding home lisinopril   Hyponatremia: Mild - Monitor  Leukocytosis: Likely due to diverticulitis.  Resolved  History of OSA: Not on CPAP  Hypokalemia -Monitor replenish K and Mg as appropriate   Morbid obesity: Elevated BMI with diabetes and hypertension. Body mass index is 38.38 kg/m.          DVT prophylaxis:  enoxaparin  (LOVENOX ) injection 40 mg Start: 08/01/24 1000  Code Status: Full code Family Communication: None at bedside. Level of care: Med-Surg Status is: Inpatient Remains inpatient appropriate because: SBO    Final disposition: Home once medically stable   35 minutes with more than 50% spent in reviewing  records, counseling patient/family and coordinating care.   Sch Meds:  Scheduled Meds:  enoxaparin  (LOVENOX ) injection  40 mg Subcutaneous Q24H   insulin  aspart  0-6 Units Subcutaneous Q6H   pantoprazole  (PROTONIX ) IV  40 mg Intravenous Q12H   sodium chloride  flush  3 mL Intravenous Q12H   Continuous Infusions:  cefTRIAXone  (ROCEPHIN )  IV 2 g (08/04/24 1654)   metronidazole  500 mg (08/05/24 1501)   PRN Meds:.acetaminophen  **OR** acetaminophen , albuterol , hydrALAZINE , morphine  injection, ondansetron  **OR** ondansetron  (ZOFRAN ) IV, phenol  Antimicrobials: Anti-infectives (From admission, onward)    Start     Dose/Rate Route Frequency Ordered Stop   08/02/24 1600  cefTRIAXone  (ROCEPHIN ) 2 g in sodium chloride  0.9 % 100 mL IVPB        2 g 200 mL/hr over 30 Minutes Intravenous Every 24 hours 08/02/24 0957     08/01/24 0300  ciprofloxacin  (CIPRO ) IVPB 400 mg  Status:  Discontinued        400 mg 200 mL/hr over 60 Minutes Intravenous Every 12 hours 07/31/24 1545 08/02/24 0957   08/01/24 0300  metroNIDAZOLE  (FLAGYL ) IVPB 500 mg        500 mg 100 mL/hr over 60 Minutes Intravenous Every 12 hours 07/31/24 1545     07/31/24 1400  ciprofloxacin  (CIPRO ) IVPB 400 mg        400 mg 200 mL/hr over 60 Minutes Intravenous  Once 07/31/24 1347 07/31/24 1620   07/31/24 1400  metroNIDAZOLE  (FLAGYL ) IVPB 500 mg        500 mg 100 mL/hr over 60 Minutes Intravenous  Once 07/31/24 1347 07/31/24 1547        I have personally reviewed the following labs and images: CBC: Recent Labs  Lab 08/01/24 0448 08/02/24 1000 08/03/24 0306 08/04/24 0544 08/05/24 1213  WBC 9.9 8.7 9.9 9.6 12.4*  HGB 14.0 14.4 14.0 14.6 14.9  HCT 41.4 42.4 42.5 44.2 47.1  MCV 86.3 86.7 86.9 88.6 92.7  PLT 224 237 244 269 216   BMP &GFR Recent Labs  Lab 08/01/24 0448 08/02/24 1000 08/03/24 0306 08/04/24 0544 08/05/24 1213  NA 135 134* 134* 139 141  K 3.3* 4.1 4.7 4.4 4.4  CL 104 103 98 100 106  CO2 23 20* 26 22  19*  GLUCOSE 90 132* 163* 153* 174*  BUN 12 9 7* 11 12  CREATININE 0.84 0.91 0.95 0.93 0.99  CALCIUM  8.5* 8.7* 9.0 8.8* 8.5*  MG 1.8 1.8 1.8 1.8 1.8  PHOS  --  3.7 3.3 3.8 3.6   Estimated Creatinine Clearance: 93 mL/min (by C-G formula based on SCr of 0.99 mg/dL). Liver & Pancreas: Recent Labs  Lab 07/31/24 1059 08/01/24 0448 08/02/24 1000 08/03/24 0306 08/04/24 0544 08/05/24 1213  AST 20 14*  --   --   --   --   ALT 16 15  --   --   --   --   ALKPHOS 48 37*  --   --   --   --   BILITOT 1.3* 1.0  --   --   --   --   PROT 7.1 5.9*  --   --   --   --   ALBUMIN 3.3* 2.7* 3.1* 3.0* 3.1* 3.1*   Recent  Labs  Lab 07/31/24 1059  LIPASE 41   No results for input(s): AMMONIA in the last 168 hours. Diabetic: No results for input(s): HGBA1C in the last 72 hours. Recent Labs  Lab 08/04/24 2033 08/05/24 0500 08/05/24 0649 08/05/24 0925 08/05/24 1151  GLUCAP 150* 110* 127* 127* 156*   Cardiac Enzymes: No results for input(s): CKTOTAL, CKMB, CKMBINDEX, TROPONINI in the last 168 hours. No results for input(s): PROBNP in the last 8760 hours. Coagulation Profile: No results for input(s): INR, PROTIME in the last 168 hours. Thyroid Function Tests: No results for input(s): TSH, T4TOTAL, FREET4, T3FREE, THYROIDAB in the last 72 hours. Lipid Profile: No results for input(s): CHOL, HDL, LDLCALC, TRIG, CHOLHDL, LDLDIRECT in the last 72 hours. Anemia Panel: No results for input(s): VITAMINB12, FOLATE, FERRITIN, TIBC, IRON, RETICCTPCT in the last 72 hours. Urine analysis:    Component Value Date/Time   COLORURINE AMBER (A) 07/31/2024 1052   APPEARANCEUR HAZY (A) 07/31/2024 1052   LABSPEC 1.035 (H) 07/31/2024 1052   PHURINE 5.0 07/31/2024 1052   GLUCOSEU >=500 (A) 07/31/2024 1052   HGBUR NEGATIVE 07/31/2024 1052   BILIRUBINUR SMALL (A) 07/31/2024 1052   BILIRUBINUR moderate (A) 07/31/2024 0907   KETONESUR 5 (A) 07/31/2024 1052    KETONESUR trace (5) (A) 07/31/2024 0907   PROTEINUR 100 (A) 07/31/2024 1052   UROBILINOGEN 1.0 07/31/2024 0907   NITRITE NEGATIVE 07/31/2024 1052   NITRITE Negative 07/31/2024 0907   LEUKOCYTESUR NEGATIVE 07/31/2024 1052   LEUKOCYTESUR Negative 07/31/2024 0907   Sepsis Labs: Invalid input(s): PROCALCITONIN, LACTICIDVEN  Microbiology: Recent Results (from the past 240 hours)  Surgical pcr screen     Status: None   Collection Time: 08/05/24  4:35 AM   Specimen: Nasal Mucosa; Nasal Swab  Result Value Ref Range Status   MRSA, PCR NEGATIVE NEGATIVE Final   Staphylococcus aureus NEGATIVE NEGATIVE Final    Comment: (NOTE) The Xpert SA Assay (FDA approved for NASAL specimens in patients 59 years of age and older), is one component of a comprehensive surveillance program. It is not intended to diagnose infection nor to guide or monitor treatment. Performed at [facility], [address] [place], [place] [address]     Radiology Studies: X-ray abdomen AP Result Date: 08/05/2024 CLINICAL DATA:  [id number] Surgery follow-up [phone]. EXAM: ABDOMEN - 1 VIEW COMPARISON:  08/05/2024, 5:42 a.m. FINDINGS: The bowel gas pattern is non-obstructive. No evidence of pneumoperitoneum. No acute osseous abnormalities. There is small right pleural effusion, blunting the right lateral costophrenic angle. Surgical changes, devices, tubes and lines: There is a new enteric tube coursing below the left hemidiaphragm with its tip overlying the left upper quadrant, overlying the fundus of the stomach region. However, the side hole is at the level of left hemidiaphragm, overlying the GE junction region. Recommend further advancement by 2-3 inches to confidently put the side hole into the stomach. IMPRESSION: *There is a new enteric tube coursing below the left hemidiaphragm with its tip overlying the left upper quadrant, overlying the fundus of the stomach region. However, the side hole is at the level of left  hemidiaphragm, overlying the GE junction region. Recommend further advancement by 2-3 inches to confidently put the side hole into the stomach. Electronically Signed   By: Ree Molt M.D.   On: 08/05/2024 10:04   DG Abd Portable 1V-Small Bowel Obstruction Protocol-24 hr delay Result Date: 08/05/2024 CLINICAL DATA:  68 year old male with recent small-bowel obstruction, transition point in the right abdomen by CT. Oral  contrast only in small bowel yesterday. EXAM: PORTABLE ABDOMEN - 1 VIEW COMPARISON:  Portable exams yesterday. CT Abdomen and Pelvis 07/31/2024. FINDINGS: Portable AP supine view at 0542 hours. Enteric tube terminates in the proximal stomach, side hole is not identified. Small-bowel obstruction gas pattern persists. No improvement compared to the scout view on 07/31/2024. Small volume of large bowel gas, but no large bowel contrast identified. Stable visualized osseous structures. Negative left lung base. IMPRESSION: 1. SBO with no improvement in gas pattern. No contrast in the large bowel. 2. Enteric tube terminates in the proximal stomach, side hole not identified. Electronically Signed   By: VEAR Hurst M.D.   On: 08/05/2024 09:45   DG Abd Portable 1V-Small Bowel Obstruction Protocol-initial, 8 hr delay Result Date: 08/04/2024 CLINICAL DATA:  Small bowel obstruction.  8 hour delay. EXAM: PORTABLE ABDOMEN - 1 VIEW COMPARISON:  Radiographs earlier today.  CT 07/31/2020 FINDINGS: Enteric tube tip and side-port below the diaphragm in the stomach. Enteric contrast within dilated small bowel centrally. There is no contrast in the colon. A few air-filled dilated loop of small bowel persists in the central abdomen. IMPRESSION: Enteric contrast within dilated small bowel centrally. No contrast in the colon. Recommend 24 hour delayed film. Electronically Signed   By: Andrea Gasman M.D.   On: 08/04/2024 22:30       Shiquita Collignon T. Ranie Chinchilla [facility]  If 7PM-7AM, please contact  night-coverage [email or web address] 08/05/2024, 3:46 PM

## 2024-08-05 NOTE — Plan of Care (Signed)

## 2024-08-05 NOTE — Anesthesia Postprocedure Evaluation (Signed)
 Anesthesia Post Note  Patient: Brett Stanley  Procedure(s) Performed: LAPAROSCOPY, DIAGNOSTIC (Abdomen)     Patient location during evaluation: PACU Anesthesia Type: General Level of consciousness: awake and alert Pain management: pain level controlled Vital Signs Assessment: post-procedure vital signs reviewed and stable Respiratory status: spontaneous breathing, nonlabored ventilation and respiratory function stable Cardiovascular status: blood pressure returned to baseline and stable Postop Assessment: no apparent nausea or vomiting Anesthetic complications: no   No notable events documented.  Last Vitals:  Vitals:   08/05/24 1000 08/05/24 1015  BP: (!) 135/90 130/77  Pulse: 78 76  Resp: 18 15  Temp:    SpO2: 94% 90%    Last Pain:  Vitals:   08/05/24 1000  TempSrc:   PainSc: 10-Worst pain ever                 Butler Levander Pinal

## 2024-08-05 NOTE — Anesthesia Procedure Notes (Addendum)
 Procedure Name: Intubation Date/Time: 08/05/2024 7:51 AM  Performed by: Zelphia Norleen HERO, CRNAPre-anesthesia Checklist: Patient identified, Emergency Drugs available, Suction available and Patient being monitored Patient Re-evaluated:Patient Re-evaluated prior to induction Oxygen Delivery Method: Circle system utilized Preoxygenation: Pre-oxygenation with 100% oxygen Induction Type: IV induction, Cricoid Pressure applied and Rapid sequence Laryngoscope Size: Mac and 3 Grade View: Grade I Tube type: Oral Tube size: 7.0 mm Number of attempts: 1 Airway Equipment and Method: Stylet Placement Confirmation: ETT inserted through vocal cords under direct vision, positive ETCO2 and breath sounds checked- equal and bilateral Secured at: 22 cm Tube secured with: Tape Dental Injury: Teeth and Oropharynx as per pre-operative assessment

## 2024-08-05 NOTE — Plan of Care (Signed)
  Problem: Coping: Goal: Ability to adjust to condition or change in health will improve Outcome: Progressing   Problem: Fluid Volume: Goal: Ability to maintain a balanced intake and output will improve Outcome: Progressing   Problem: Health Behavior/Discharge Planning: Goal: Ability to identify and utilize available resources and services will improve Outcome: Progressing Goal: Ability to manage health-related needs will improve Outcome: Progressing   Problem: Nutritional: Goal: Maintenance of adequate nutrition will improve Outcome: Not Progressing

## 2024-08-05 NOTE — Progress Notes (Signed)
 MD Metzger verified NG tube is in a good place. Patient's NG tube is now connected on low intermittent suction.

## 2024-08-05 NOTE — Progress Notes (Signed)
 Pt went down to OR for procedure, gave report to Garrel Farr, CRNA

## 2024-08-05 NOTE — Progress Notes (Signed)
 * Day of Surgery *  Subjective: CC: No flatus or BM. AXR shows persistent SB dilation w/o contrast in the colon.    Objective: Vital signs in last 24 hours: Temp:  [97.5 F (36.4 C)-98.2 F (36.8 C)] 97.6 F (36.4 C) (08/22 0643) Pulse Rate:  [72-102] 82 (08/22 0643) Resp:  [17-20] 20 (08/22 0643) BP: (109-161)/(82-140) 129/82 (08/22 0643) SpO2:  [94 %-97 %] 95 % (08/22 0643) Weight:  [117.9 kg] 117.9 kg (08/22 0643) Last BM Date : 07/28/24  Intake/Output from previous day: 08/21 0701 - 08/22 0700 In: 0  Out: 2250 [Urine:750; Emesis/NG output:1500] Intake/Output this shift: No intake/output data recorded.  PE: Gen:  Alert, NAD, pleasant Abd: Soft, protuberant, moderate distension, central abdominal ttp without rigidity or guarding.   Lab Results:  Recent Labs    08/03/24 0306 08/04/24 0544  WBC 9.9 9.6  HGB 14.0 14.6  HCT 42.5 44.2  PLT 244 269   BMET Recent Labs    08/03/24 0306 08/04/24 0544  NA 134* 139  K 4.7 4.4  CL 98 100  CO2 26 22  GLUCOSE 163* 153*  BUN 7* 11  CREATININE 0.95 0.93  CALCIUM  9.0 8.8*   PT/INR No results for input(s): LABPROT, INR in the last 72 hours. CMP     Component Value Date/Time   NA 139 08/04/2024 0544   K 4.4 08/04/2024 0544   CL 100 08/04/2024 0544   CO2 22 08/04/2024 0544   GLUCOSE 153 (H) 08/04/2024 0544   BUN 11 08/04/2024 0544   CREATININE 0.93 08/04/2024 0544   CALCIUM  8.8 (L) 08/04/2024 0544   PROT 5.9 (L) 08/01/2024 0448   ALBUMIN 3.1 (L) 08/04/2024 0544   AST 14 (L) 08/01/2024 0448   ALT 15 08/01/2024 0448   ALKPHOS 37 (L) 08/01/2024 0448   BILITOT 1.0 08/01/2024 0448   GFRNONAA >60 08/04/2024 0544   GFRAA 114 02/13/2009 0935   Lipase     Component Value Date/Time   LIPASE 41 07/31/2024 1059    Studies/Results: DG Abd Portable 1V-Small Bowel Obstruction Protocol-initial, 8 hr delay Result Date: 08/04/2024 CLINICAL DATA:  Small bowel obstruction.  8 hour delay. EXAM: PORTABLE ABDOMEN  - 1 VIEW COMPARISON:  Radiographs earlier today.  CT 07/31/2020 FINDINGS: Enteric tube tip and side-port below the diaphragm in the stomach. Enteric contrast within dilated small bowel centrally. There is no contrast in the colon. A few air-filled dilated loop of small bowel persists in the central abdomen. IMPRESSION: Enteric contrast within dilated small bowel centrally. No contrast in the colon. Recommend 24 hour delayed film. Electronically Signed   By: Andrea Gasman M.D.   On: 08/04/2024 22:30   DG Abd 1 View Result Date: 08/04/2024 EXAM: 1 VIEW XRAY OF THE ABDOMEN 08/04/2024 05:52:11 AM COMPARISON: Abdominal series dated 08/04/2024. CLINICAL HISTORY: Encounter for nasogastric (NG) tube placement. FINDINGS: BOWEL: Nonobstructive bowel gas pattern. SOFT TISSUES: No opaque urinary calculi. BONES: No acute osseous abnormality. LINES AND TUBES: Since the previous study, a gastric tube has been placed and its tip is along the greater curvature of the mid body of the stomach. The side port of the catheter appears to be just beneath the gastroesophageal junction. IMPRESSION: 1. Gastric tube in appropriate position with tip along the greater curvature of the mid body of the stomach and side port just beneath the gastroesophageal junction. Electronically signed by: Evalene Coho MD 08/04/2024 08:47 AM EDT RP Workstation: [id number]   DG Abd Portable 1V-Small Bowel Obstruction  Protocol-initial, 8 hr delay Result Date: 08/04/2024 CLINICAL DATA:  Admitted for small bowel obstruction, evaluate 8 hours following oral contrast. EXAM: PORTABLE ABDOMEN - 1 VIEW COMPARISON:  Flat plate exam yesterday at 10:21 a.m. FINDINGS: 1:36 a.m. The stomach is moderately dilated and filled with dilute contrast. Contrast faintly opacifies dilated bowel segments in the mid to lower abdomen, little if any change noted with maximal small bowel caliber of 4.3 cm which is similar to the CT from 07/31/2024. No contrast is seen in the  colon. No colonic dilatation. There is scattered gas and stool in the colon at least as far as the mid descending segment. IMPRESSION: Contrast faintly opacifies dilated small bowel segments in the mid to lower abdomen, little if any change noted with maximal small bowel caliber of 4.3 cm which is similar to the CT from 07/31/2024. No contrast is seen in the colon. Electronically Signed   By: Francis Quam M.D.   On: 08/04/2024 02:18   DG Abd 1 View Result Date: 08/03/2024 CLINICAL DATA:  Vomiting. EXAM: ABDOMEN - 1 VIEW COMPARISON:  August 01, 2024. FINDINGS: Mild small bowel dilatation is noted concerning for ileus or distal small bowel obstruction. No colonic dilatation is noted. No radio-opaque calculi or other significant radiographic abnormality are seen. IMPRESSION: Mild small bowel dilatation is noted concerning for ileus or distal small bowel obstruction. Electronically Signed   By: Lynwood Landy Raddle M.D.   On: 08/03/2024 11:18    Anti-infectives: Anti-infectives (From admission, onward)    Start     Dose/Rate Route Frequency Ordered Stop   08/02/24 1600  [MAR Hold]  cefTRIAXone  (ROCEPHIN ) 2 g in sodium chloride  0.9 % 100 mL IVPB        (MAR Hold since Fri 08/05/2024 at 0640.Hold Reason: Transfer to a Procedural area)   2 g 200 mL/hr over 30 Minutes Intravenous Every 24 hours 08/02/24 0957     08/01/24 0300  ciprofloxacin  (CIPRO ) IVPB 400 mg  Status:  Discontinued        400 mg 200 mL/hr over 60 Minutes Intravenous Every 12 hours 07/31/24 1545 08/02/24 0957   08/01/24 0300  [MAR Hold]  metroNIDAZOLE  (FLAGYL ) IVPB 500 mg        (MAR Hold since Fri 08/05/2024 at 0640.Hold Reason: Transfer to a Procedural area)   500 mg 100 mL/hr over 60 Minutes Intravenous Every 12 hours 07/31/24 1545     07/31/24 1400  ciprofloxacin  (CIPRO ) IVPB 400 mg        400 mg 200 mL/hr over 60 Minutes Intravenous  Once 07/31/24 1347 07/31/24 1620   07/31/24 1400  metroNIDAZOLE  (FLAGYL ) IVPB 500 mg        500  mg 100 mL/hr over 60 Minutes Intravenous  Once 07/31/24 1347 07/31/24 1547        Assessment/Plan Brett Stanley is an 68 y.o. male with short segment simple sigmoid diverticulitis and a small bowel obstruction.  - Given that he has failed conservative management at this point I have recommended that we proceed to the OR for exploration. I will start lap but discussed the high chance of needing an open operation.  The operative course will be dictated based on intra-op findings and may include LOA, small bowel resection, or a partial colectomy. He is no prepped and would need a colostomy. We discussed the alternatives and potential risks of surgery, including but not limited to: bleeding, infection, damage to bowel or surrounding structures, damage to the ureter, ostomy complications,  anastomosis complications, and need for additional procedures. All questions were addressed and consent was obtained.      FEN - NPO, NGT to LIWS, IVF per primary. Keep K >=4, Phos >= 3, Mg >= 2 VTE - SCDs, Lovenox . ID - Rocephin /Flagyl    HTN HLD DM OSA  I reviewed nursing notes, hospitalist notes, last 24 h vitals and pain scores, last 48 h intake and output, last 24 h labs and trends, and last 24 h imaging results.    LOS: 5 days    Cordella DELENA Idler, MD [facility] Surgery 08/05/2024, 7:18 AM Please see Amion for pager number during day hours 7:00am-4:30pm

## 2024-08-05 NOTE — Progress Notes (Signed)
 Patient arrived to 6N09 from PACU with a 0/10 pain. IV fluids started, scheduled medication given, tolerated well. All needs met at this time, bed in lowest position and call light within reach.

## 2024-08-05 NOTE — Op Note (Signed)
 08/05/2024  9:14 AM  PATIENT:  Brett Stanley  68 y.o. male  Patient Care Team: [name], MD as PCP - General (Family Medicine) Joshua Rush, OD (Optometry) Neda Jennet LABOR, MD as Consulting Physician (Pulmonary Disease)  PRE-OPERATIVE DIAGNOSIS:  Small bowel obstruction  POST-OPERATIVE DIAGNOSIS:  Enteritis  PROCEDURE:  Diagnostic laparoscopy  SURGEON:  Cordella RONAL Idler, MD  ASSISTANT: None  ANESTHESIA:   general  COUNTS:  Sponge, needle and instrument counts were reported correct x2 at the conclusion of the operation.  EBL: Minimal  DRAINS: None  SPECIMEN: None  COMPLICATIONS: None  FINDINGS: Mildly dilated small bowel without a clear transition point. There were a few flimsy adhesions in the RLQ. Signs of uncomplicated diverticulitis  DISPOSITION: PACU in satisfactory condition  INDICATION: Mr. Blizzard is a 68 year old gentleman who has no surgical history and presented to the ED with abdominal pain.  He had a CT scan that was concerning for diverticulitis without complication as well as a small bowel obstruction.  He was admitted for observation and management of his diverticulitis as well as bowel obstruction.  His pain resolved on hospital day 1, and he had a bowel movement.  He was started on a clear liquid diet and advance to full liquids, however, he failed to have a bowel movement and ultimately developed recurrent nausea and emesis.  He was administered oral contrast which never made it past his small bowel.  Given his clinical picture I recommended that we proceed to the operating room for exploration.  All of his questions were addressed and written consent was obtained.  DESCRIPTION: The patient was identified in preop holding and taken to the OR where he was placed on the operating room table. SCDs were placed. General endotracheal anesthesia was induced without difficulty. His abdomen was then prepped and draped in the usual sterile fashion. A surgical  timeout was performed indicating the correct patient, procedure, positioning and need for preoperative antibiotics.   Began by placing a Veress needle left upper quadrant Palmer's point.  Following aspiration of air and positive saline drop test the insufflation tubing was connected and the abdomen was brought to to 15 mmHg.  He tolerated this well.  I placed a 5mm port in the left hemi-abdomen using an optiview technique.  A laparoscope was introduced and there was no evidence of injury from entry.  2 additional 5 mm ports were placed 1 in the left abdomen and 1 in the supraumbilical position.  There was a small volume of clear ascites that was suctioned out of the pelvis. I Identified the cecum and began by running the small bowel starting at the TI.  There were a few adhesions within the right lower quadrant that were lysed.  There was a few interloop adhesions in the right lower quadrant that were flimsy and easily broken apart.  There was no change in the caliber of the small bowel after these adhesions were lysed.  I ran the small bowel proximal to the LOT.  The entirety of the small bowel appeared slightly dilated and injected, however, there were no additional areas of pathology identified.  There is no signs of necrosis or perforation.  I ran the bowel once more from the LOT to the TI and again did not identify any source for his obstructive symptoms.  I then placed 2 additional 5 mm ports in the right hemiabdomen under direct visualization.  I identified the descending colon.  There was a very short segment  of sigmoid with some flimsy adhesions to the abdominal wall consistent with his recent diverticulitis.  The colon was not dilated and I could not palpate a mass or other pathology.  I chose not to perform a colonic resection at this time. Given that there was no obvious pathology identified, I chose to conclude the case. The trocars were removed. The port sites were injected with 0.25% marcaine  with  epinephrine  and closed with 4-0 monocryl. The wounds were covered with dermabond.

## 2024-08-05 NOTE — Transfer of Care (Signed)
 Immediate Anesthesia Transfer of Care Note  Patient: Brett Stanley  Procedure(s) Performed: LAPAROSCOPY, DIAGNOSTIC (Abdomen)  Patient Location: PACU  Anesthesia Type:General  Level of Consciousness: awake, alert , and oriented  Airway & Oxygen Therapy: Patient Spontanous Breathing  Post-op Assessment: Report given to RN and Post -op Vital signs reviewed and stable  Post vital signs: Reviewed and stable  Last Vitals:  Vitals Value Taken Time  BP 114/81 08/05/24 09:21  Temp 98   Pulse 82 08/05/24 09:27  Resp 18 08/05/24 09:27  SpO2 94 % 08/05/24 09:27  Vitals shown include unfiled device data.  Last Pain:  Vitals:   08/05/24 0727  TempSrc:   PainSc: 0-No pain         Complications: No notable events documented.

## 2024-08-06 DIAGNOSIS — K56609 Unspecified intestinal obstruction, unspecified as to partial versus complete obstruction: Secondary | ICD-10-CM | POA: Diagnosis not present

## 2024-08-06 DIAGNOSIS — E66812 Obesity, class 2: Secondary | ICD-10-CM | POA: Diagnosis not present

## 2024-08-06 DIAGNOSIS — D72825 Bandemia: Secondary | ICD-10-CM | POA: Diagnosis not present

## 2024-08-06 DIAGNOSIS — K5732 Diverticulitis of large intestine without perforation or abscess without bleeding: Secondary | ICD-10-CM | POA: Diagnosis not present

## 2024-08-06 LAB — RENAL FUNCTION PANEL
Albumin: 2.8 g/dL — ABNORMAL LOW (ref 3.5–5.0)
Anion gap: 12 (ref 5–15)
BUN: 12 mg/dL (ref 8–23)
CO2: 22 mmol/L (ref 22–32)
Calcium: 8.5 mg/dL — ABNORMAL LOW (ref 8.9–10.3)
Chloride: 102 mmol/L (ref 98–111)
Creatinine, Ser: 0.99 mg/dL (ref 0.61–1.24)
GFR, Estimated: >60 mL/min (ref >60)
Glucose, Bld: 124 mg/dL — ABNORMAL HIGH (ref 70–99)
Phosphorus: 2.9 mg/dL (ref 2.5–4.6)
Potassium: 4 mmol/L (ref 3.5–5.1)
Sodium: 136 mmol/L (ref 135–145)

## 2024-08-06 LAB — CBC
HCT: 43 % (ref 39.0–52.0)
Hemoglobin: 14.1 g/dL (ref 13.0–17.0)
MCH: 29 pg (ref 26.0–34.0)
MCHC: 32.8 g/dL (ref 30.0–36.0)
MCV: 88.3 fL (ref 80.0–100.0)
Platelets: 271 K/uL (ref 150–400)
RBC: 4.87 MIL/uL (ref 4.22–5.81)
RDW: 12.7 % (ref 11.5–15.5)
WBC: 11.6 K/uL — ABNORMAL HIGH (ref 4.0–10.5)
nRBC: 0 % (ref 0.0–0.2)

## 2024-08-06 LAB — MAGNESIUM: Magnesium: 1.9 mg/dL (ref 1.7–2.4)

## 2024-08-06 LAB — GLUCOSE, CAPILLARY
Glucose-Capillary: 122 mg/dL — ABNORMAL HIGH (ref 70–99)
Glucose-Capillary: 128 mg/dL — ABNORMAL HIGH (ref 70–99)
Glucose-Capillary: 129 mg/dL — ABNORMAL HIGH (ref 70–99)

## 2024-08-06 MED ORDER — LACTATED RINGERS IV SOLN: INTRAVENOUS | Status: DC

## 2024-08-06 MED ORDER — METHOCARBAMOL 1000 MG/10ML IJ SOLN: 500.0000 mg | Freq: Four times a day (QID) | INTRAMUSCULAR | Status: DC | PRN

## 2024-08-06 MED ORDER — ACETAMINOPHEN 10 MG/ML IV SOLN
1000.0000 mg | Freq: Four times a day (QID) | INTRAVENOUS | Status: AC
  Administered 2024-08-06 – 2024-08-07 (×3): 1000 mg via INTRAVENOUS
  Filled 2024-08-06 (×3): qty 100

## 2024-08-06 MED ORDER — OXYMETAZOLINE HCL 0.05 % NA SOLN
NASAL | Status: AC
  Filled 2024-08-06: qty 30

## 2024-08-06 NOTE — Plan of Care (Signed)
  Problem: Coping: Goal: Ability to adjust to condition or change in health will improve Outcome: Progressing   Problem: Health Behavior/Discharge Planning: Goal: Ability to manage health-related needs will improve Outcome: Progressing   Problem: Education: Goal: Knowledge of General Education information will improve Description: Including pain rating scale, medication(s)/side effects and non-pharmacologic comfort measures Outcome: Progressing   Problem: Pain Managment: Goal: General experience of comfort will improve and/or be controlled Outcome: Progressing   Problem: Fluid Volume: Goal: Ability to maintain a balanced intake and output will improve Outcome: Not Progressing

## 2024-08-06 NOTE — Progress Notes (Signed)
 PROGRESS NOTE  Brett Stanley FMW:[id number] DOB: [birth year]   PCP: [name], MD  Patient is from: Home.  Lives with his wife.  Independently ambulates at baseline.  DOA: 07/31/2024 LOS: 6  Chief complaints Chief Complaint  Patient presents with   Diarrhea     Brief Narrative / Interim history: 68 year old M with PMH of OSA not on CPAP, DM-2, HTN and morbid obesity presenting with abdominal pain and distention for about 5 days, and admitted with small bowel obstruction and sigmoid diverticulitis.  Recently diagnosed with viral gastroenteritis after he presented there with abdominal discomfort, diarrhea and vomiting.  No prior abdominal surgery.  Colonoscopy in 2021 with two 8 to 10 mm polyps.  CT abdomen and pelvis showed SBO with transition point in right hemiabdomen and uncomplicated acute sigmoid diverticulitis without perforation.  Patient was started on IV antibiotics.  Surgery consulted and following.  Patient had a setback after initial improvement and required NG tube placement on 8/21.  After he failed conservative intervention, he underwent diagnostic laparoscopy on 8/22 that showed mildly dilated small bowel without a clear transition point but few flimsy adhesions in RLQ.  Subjective: No major events overnight of this morning.  No major events overnight or this morning.  No lack with bowel movement or flatus yet.  About 1.1 L from NG tube in the last 24 hours.  Objective: Vitals:   08/05/24 1951 08/05/24 2329 08/06/24 0435 08/06/24 0835  BP: (!) 149/90 129/79 126/74 135/80  Pulse: 84 73 69 72  Resp: 18 18 17 17   Temp: 98.4 F (36.9 C) 97.7 F (36.5 C) 97.9 F (36.6 C) 98 F (36.7 C)  TempSrc: Oral Oral  Oral  SpO2: 95% 97% 99% 100%  Weight:      Height:        Examination:  GENERAL: No apparent distress.  Nontoxic. HEENT: MMM.  Vision and hearing grossly intact.  NGT in place. NECK: Supple.  No apparent JVD.  RESP:  No IWOB.  Fair aeration  bilaterally. CVS:  RRR. Heart sounds normal.  ABD/GI/GU: Diminished BS.  Abd slightly distended.  Nontender. MSK/EXT:  Moves extremities. No apparent deformity. No edema.  SKIN: no apparent skin lesion or wound NEURO: AA.  Oriented appropriately.  No apparent focal neuro deficit. PSYCH: Calm. Normal affect.   Consultants:  General surgery  Procedures: 8/22-diagnostic laparoscopy  Microbiology summarized: None  Assessment and plan: Small bowel obstruction: Presents with abdominal pain and distention preceded by nausea, vomiting and diarrhea earlier in the week.  CT showed SBO with transition point in right hemiabdomen.  Had setback after initial improvement requiring NG tube placement.  Failed conservative measures. -S/p diagnostic laparoscopy by Dr. Polly on 8/22 -Appreciate help by general surgery-n.p.o., NGT to LIWS -Continue IV fluid -Continue IV Protonix  40 mg twice daily -Antiemetics as needed  Acute sigmoid diverticulitis: Nontender. - Cipro  and Flagyl >> ceftriaxone  and Flagyl   NIDDM-2 with hyperglycemia: A1c 7.9% in 03/2024.  On metformin , glipizide  and Ozempic . Recent Labs  Lab 08/05/24 1151 08/05/24 1733 08/05/24 2329 08/06/24 0552 08/06/24 1217  GLUCAP 156* 169* 137* 122* 128*  -SSI-very sensitive every 6 hours  Essential hypertension: Normotensive -Continue holding home lisinopril   Hyponatremia: Resolved.  Leukocytosis: Likely due to diverticulitis.  Resolved  History of OSA: Not on CPAP  Hypokalemia -Monitor replenish K and Mg as appropriate   Morbid obesity: Elevated BMI with diabetes and hypertension. Body mass index is 38.38 kg/m.  DVT prophylaxis:  enoxaparin  (LOVENOX ) injection 40 mg Start: 08/01/24 1000  Code Status: Full code Family Communication: Updated patient's son at bedside and patient's wife over the phone Level of care: Med-Surg Status is: Inpatient Remains inpatient appropriate because: SBO    Final disposition:  Home once medically stable   35 minutes with more than 50% spent in reviewing records, counseling patient/family and coordinating care.   Sch Meds:  Scheduled Meds:  enoxaparin  (LOVENOX ) injection  40 mg Subcutaneous Q24H   insulin  aspart  0-6 Units Subcutaneous Q6H   pantoprazole  (PROTONIX ) IV  40 mg Intravenous Q12H   sodium chloride  flush  3 mL Intravenous Q12H   Continuous Infusions:  acetaminophen  1,000 mg (08/06/24 1217)   cefTRIAXone  (ROCEPHIN )  IV 2 g (08/05/24 1736)   lactated ringers  125 mL/hr at 08/06/24 1214   metronidazole  500 mg (08/06/24 1512)   PRN Meds:.albuterol , hydrALAZINE , methocarbamol  (ROBAXIN ) injection, morphine  injection, ondansetron  **OR** ondansetron  (ZOFRAN ) IV, phenol  Antimicrobials: Anti-infectives (From admission, onward)    Start     Dose/Rate Route Frequency Ordered Stop   08/02/24 1600  cefTRIAXone  (ROCEPHIN ) 2 g in sodium chloride  0.9 % 100 mL IVPB        2 g 200 mL/hr over 30 Minutes Intravenous Every 24 hours 08/02/24 0957     08/01/24 0300  ciprofloxacin  (CIPRO ) IVPB 400 mg  Status:  Discontinued        400 mg 200 mL/hr over 60 Minutes Intravenous Every 12 hours 07/31/24 1545 08/02/24 0957   08/01/24 0300  metroNIDAZOLE  (FLAGYL ) IVPB 500 mg        500 mg 100 mL/hr over 60 Minutes Intravenous Every 12 hours 07/31/24 1545     07/31/24 1400  ciprofloxacin  (CIPRO ) IVPB 400 mg        400 mg 200 mL/hr over 60 Minutes Intravenous  Once 07/31/24 1347 07/31/24 1620   07/31/24 1400  metroNIDAZOLE  (FLAGYL ) IVPB 500 mg        500 mg 100 mL/hr over 60 Minutes Intravenous  Once 07/31/24 1347 07/31/24 1547        I have personally reviewed the following labs and images: CBC: Recent Labs  Lab 08/02/24 1000 08/03/24 0306 08/04/24 0544 08/05/24 1213 08/06/24 0902  WBC 8.7 9.9 9.6 12.4* 11.6*  HGB 14.4 14.0 14.6 14.9 14.1  HCT 42.4 42.5 44.2 47.1 43.0  MCV 86.7 86.9 88.6 92.7 88.3  PLT 237 244 269 216 271   BMP &GFR Recent Labs  Lab  08/02/24 1000 08/03/24 0306 08/04/24 0544 08/05/24 1213 08/06/24 0902  NA 134* 134* 139 141 136  K 4.1 4.7 4.4 4.4 4.0  CL 103 98 100 106 102  CO2 20* 26 22 19* 22  GLUCOSE 132* 163* 153* 174* 124*  BUN 9 7* 11 12 12   CREATININE 0.91 0.95 0.93 0.99 0.99  CALCIUM  8.7* 9.0 8.8* 8.5* 8.5*  MG 1.8 1.8 1.8 1.8 1.9  PHOS 3.7 3.3 3.8 3.6 2.9   Estimated Creatinine Clearance: 93 mL/min (by C-G formula based on SCr of 0.99 mg/dL). Liver & Pancreas: Recent Labs  Lab 07/31/24 1059 08/01/24 0448 08/02/24 1000 08/03/24 0306 08/04/24 0544 08/05/24 1213 08/06/24 0902  AST 20 14*  --   --   --   --   --   ALT 16 15  --   --   --   --   --   ALKPHOS 48 37*  --   --   --   --   --  BILITOT 1.3* 1.0  --   --   --   --   --   PROT 7.1 5.9*  --   --   --   --   --   ALBUMIN 3.3* 2.7* 3.1* 3.0* 3.1* 3.1* 2.8*   Recent Labs  Lab 07/31/24 1059  LIPASE 41   No results for input(s): AMMONIA in the last 168 hours. Diabetic: No results for input(s): HGBA1C in the last 72 hours. Recent Labs  Lab 08/05/24 1151 08/05/24 1733 08/05/24 2329 08/06/24 0552 08/06/24 1217  GLUCAP 156* 169* 137* 122* 128*   Cardiac Enzymes: No results for input(s): CKTOTAL, CKMB, CKMBINDEX, TROPONINI in the last 168 hours. No results for input(s): PROBNP in the last 8760 hours. Coagulation Profile: No results for input(s): INR, PROTIME in the last 168 hours. Thyroid Function Tests: No results for input(s): TSH, T4TOTAL, FREET4, T3FREE, THYROIDAB in the last 72 hours. Lipid Profile: No results for input(s): CHOL, HDL, LDLCALC, TRIG, CHOLHDL, LDLDIRECT in the last 72 hours. Anemia Panel: No results for input(s): VITAMINB12, FOLATE, FERRITIN, TIBC, IRON, RETICCTPCT in the last 72 hours. Urine analysis:    Component Value Date/Time   COLORURINE AMBER (A) 07/31/2024 1052   APPEARANCEUR HAZY (A) 07/31/2024 1052   LABSPEC 1.035 (H) 07/31/2024 1052   PHURINE  5.0 07/31/2024 1052   GLUCOSEU >=500 (A) 07/31/2024 1052   HGBUR NEGATIVE 07/31/2024 1052   BILIRUBINUR SMALL (A) 07/31/2024 1052   BILIRUBINUR moderate (A) 07/31/2024 0907   KETONESUR 5 (A) 07/31/2024 1052   KETONESUR trace (5) (A) 07/31/2024 0907   PROTEINUR 100 (A) 07/31/2024 1052   UROBILINOGEN 1.0 07/31/2024 0907   NITRITE NEGATIVE 07/31/2024 1052   NITRITE Negative 07/31/2024 0907   LEUKOCYTESUR NEGATIVE 07/31/2024 1052   LEUKOCYTESUR Negative 07/31/2024 0907   Sepsis Labs: Invalid input(s): PROCALCITONIN, LACTICIDVEN  Microbiology: Recent Results (from the past 240 hours)  Surgical pcr screen     Status: None   Collection Time: 08/05/24  4:35 AM   Specimen: Nasal Mucosa; Nasal Swab  Result Value Ref Range Status   MRSA, PCR NEGATIVE NEGATIVE Final   Staphylococcus aureus NEGATIVE NEGATIVE Final    Comment: (NOTE) The Xpert SA Assay (FDA approved for NASAL specimens in patients 87 years of age and older), is one component of a comprehensive surveillance program. It is not intended to diagnose infection nor to guide or monitor treatment. Performed at [facility], [address] [place], [place] [address]     Radiology Studies: DG Abd 1 View Result Date: 08/05/2024 CLINICAL DATA:  Nasogastric tube placement. EXAM: ABDOMEN - 1 VIEW COMPARISON:  Earlier today FINDINGS: Nasogastric tube remains in place with tip again seen overlying the proximal gastric body, with side port at the GE junction. No No evidence of dilated bowel loops. IMPRESSION: Nasogastric tube tip overlies the proximal gastric body, with side port at the GE junction. Electronically Signed   By: Norleen DELENA Kil M.D.   On: 08/05/2024 16:35       Winna Golla T. Kedron Un[facility]  If 7PM-7AM, please contact night-coverage [email or web address] 08/06/2024, 3:45 PM

## 2024-08-06 NOTE — Progress Notes (Signed)
 1 Day Post-Op  Subjective: CC: Reports soreness around his incisions. No nausea. NGT w/ 1.1L/24 hours. No flatus or BM. Voiding. Mobilizing.   Afebrile. No tachycardia or hypotension. No labs done today.   Objective: Vital signs in last 24 hours: Temp:  [97.7 F (36.5 C)-98.4 F (36.9 C)] 97.9 F (36.6 C) (08/23 0435) Pulse Rate:  [69-87] 69 (08/23 0435) Resp:  [15-19] 17 (08/23 0435) BP: (106-149)/(62-90) 126/74 (08/23 0435) SpO2:  [90 %-99 %] 99 % (08/23 0435) Last BM Date : 07/28/24  Intake/Output from previous day: 08/22 0701 - 08/23 0700 In: 1653 [I.V.:1653] Out: 2010 [Urine:910; Emesis/NG output:1100] Intake/Output this shift: No intake/output data recorded.  PE: Gen:  Alert, NAD, pleasant Abd: Soft, mild distension, appropriately tender around laparoscopic incisions, no rigidity or guarding and otherwise NT, +BS. Incisions with glue intact appears well and are without drainage, bleeding, or signs of infection. NGT in place on LIWS  Lab Results:  Recent Labs    08/04/24 0544 08/05/24 1213  WBC 9.6 12.4*  HGB 14.6 14.9  HCT 44.2 47.1  PLT 269 216   BMET Recent Labs    08/04/24 0544 08/05/24 1213  NA 139 141  K 4.4 4.4  CL 100 106  CO2 22 19*  GLUCOSE 153* 174*  BUN 11 12  CREATININE 0.93 0.99  CALCIUM  8.8* 8.5*   PT/INR No results for input(s): LABPROT, INR in the last 72 hours. CMP     Component Value Date/Time   NA 141 08/05/2024 1213   K 4.4 08/05/2024 1213   CL 106 08/05/2024 1213   CO2 19 (L) 08/05/2024 1213   GLUCOSE 174 (H) 08/05/2024 1213   BUN 12 08/05/2024 1213   CREATININE 0.99 08/05/2024 1213   CALCIUM  8.5 (L) 08/05/2024 1213   PROT 5.9 (L) 08/01/2024 0448   ALBUMIN 3.1 (L) 08/05/2024 1213   AST 14 (L) 08/01/2024 0448   ALT 15 08/01/2024 0448   ALKPHOS 37 (L) 08/01/2024 0448   BILITOT 1.0 08/01/2024 0448   GFRNONAA >60 08/05/2024 1213   GFRAA 114 02/13/2009 0935   Lipase     Component Value Date/Time   LIPASE 41  07/31/2024 1059    Studies/Results: DG Abd 1 View Result Date: 08/05/2024 CLINICAL DATA:  Nasogastric tube placement. EXAM: ABDOMEN - 1 VIEW COMPARISON:  Earlier today FINDINGS: Nasogastric tube remains in place with tip again seen overlying the proximal gastric body, with side port at the GE junction. No No evidence of dilated bowel loops. IMPRESSION: Nasogastric tube tip overlies the proximal gastric body, with side port at the GE junction. Electronically Signed   By: Norleen DELENA Kil M.D.   On: 08/05/2024 16:35   X-ray abdomen AP Result Date: 08/05/2024 CLINICAL DATA:  450301 Surgery follow-up 450301. EXAM: ABDOMEN - 1 VIEW COMPARISON:  08/05/2024, 5:42 a.m. FINDINGS: The bowel gas pattern is non-obstructive. No evidence of pneumoperitoneum. No acute osseous abnormalities. There is small right pleural effusion, blunting the right lateral costophrenic angle. Surgical changes, devices, tubes and lines: There is a new enteric tube coursing below the left hemidiaphragm with its tip overlying the left upper quadrant, overlying the fundus of the stomach region. However, the side hole is at the level of left hemidiaphragm, overlying the GE junction region. Recommend further advancement by 2-3 inches to confidently put the side hole into the stomach. IMPRESSION: *There is a new enteric tube coursing below the left hemidiaphragm with its tip overlying the left upper quadrant, overlying the fundus of  the stomach region. However, the side hole is at the level of left hemidiaphragm, overlying the GE junction region. Recommend further advancement by 2-3 inches to confidently put the side hole into the stomach. Electronically Signed   By: Ree Molt M.D.   On: 08/05/2024 10:04   DG Abd Portable 1V-Small Bowel Obstruction Protocol-24 hr delay Result Date: 08/05/2024 CLINICAL DATA:  68 year old male with recent small-bowel obstruction, transition point in the right abdomen by CT. Oral contrast only in small bowel  yesterday. EXAM: PORTABLE ABDOMEN - 1 VIEW COMPARISON:  Portable exams yesterday. CT Abdomen and Pelvis 07/31/2024. FINDINGS: Portable AP supine view at 0542 hours. Enteric tube terminates in the proximal stomach, side hole is not identified. Small-bowel obstruction gas pattern persists. No improvement compared to the scout view on 07/31/2024. Small volume of large bowel gas, but no large bowel contrast identified. Stable visualized osseous structures. Negative left lung base. IMPRESSION: 1. SBO with no improvement in gas pattern. No contrast in the large bowel. 2. Enteric tube terminates in the proximal stomach, side hole not identified. Electronically Signed   By: VEAR Hurst M.D.   On: 08/05/2024 09:45   DG Abd Portable 1V-Small Bowel Obstruction Protocol-initial, 8 hr delay Result Date: 08/04/2024 CLINICAL DATA:  Small bowel obstruction.  8 hour delay. EXAM: PORTABLE ABDOMEN - 1 VIEW COMPARISON:  Radiographs earlier today.  CT 07/31/2020 FINDINGS: Enteric tube tip and side-port below the diaphragm in the stomach. Enteric contrast within dilated small bowel centrally. There is no contrast in the colon. A few air-filled dilated loop of small bowel persists in the central abdomen. IMPRESSION: Enteric contrast within dilated small bowel centrally. No contrast in the colon. Recommend 24 hour delayed film. Electronically Signed   By: Andrea Gasman M.D.   On: 08/04/2024 22:30    Anti-infectives: Anti-infectives (From admission, onward)    Start     Dose/Rate Route Frequency Ordered Stop   08/02/24 1600  cefTRIAXone  (ROCEPHIN ) 2 g in sodium chloride  0.9 % 100 mL IVPB        2 g 200 mL/hr over 30 Minutes Intravenous Every 24 hours 08/02/24 0957     08/01/24 0300  ciprofloxacin  (CIPRO ) IVPB 400 mg  Status:  Discontinued        400 mg 200 mL/hr over 60 Minutes Intravenous Every 12 hours 07/31/24 1545 08/02/24 0957   08/01/24 0300  metroNIDAZOLE  (FLAGYL ) IVPB 500 mg        500 mg 100 mL/hr over 60 Minutes  Intravenous Every 12 hours 07/31/24 1545     07/31/24 1400  ciprofloxacin  (CIPRO ) IVPB 400 mg        400 mg 200 mL/hr over 60 Minutes Intravenous  Once 07/31/24 1347 07/31/24 1620   07/31/24 1400  metroNIDAZOLE  (FLAGYL ) IVPB 500 mg        500 mg 100 mL/hr over 60 Minutes Intravenous  Once 07/31/24 1347 07/31/24 1547        Assessment/Plan POD 1 s/p diagnostic laparoscopy on 8/22 by Dr. Polly - Patient presented with short segment simple sigmoid diverticulitis and a small bowel obstruction in the setting of no prior abdominal surgeries. Patient failed diet advancement with n/v requiring NGT and was taken to the OR on 8/22 for diagnostic laparoscopy - Intra-op findings: Mildly dilated small bowel without a clear transition point. There were a few flimsy adhesions in the RLQ. Signs of uncomplicated diverticulitis  - Cont NGT, AROBF. Consider TPN in coming days if no ROBF - Cont abx for  diverticulitis. Recommend colonoscopy in 6 weeks after discharge.  - Mobilize, pulm toilet  FEN - NPO, NGT to LIWS, IVF per [facility] VTE - SCDs, Lovenox  ID - Rocephin /Flagyl  Foley - None, spont void    LOS: 6 days    Ozell CHRISTELLA Shaper, [facility] Surgery 08/06/2024, 8:34 AM Please see Amion for pager number during day hours 7:00am-4:30pm

## 2024-08-07 DIAGNOSIS — D72825 Bandemia: Secondary | ICD-10-CM | POA: Diagnosis not present

## 2024-08-07 DIAGNOSIS — K5732 Diverticulitis of large intestine without perforation or abscess without bleeding: Secondary | ICD-10-CM | POA: Diagnosis not present

## 2024-08-07 DIAGNOSIS — K56609 Unspecified intestinal obstruction, unspecified as to partial versus complete obstruction: Secondary | ICD-10-CM | POA: Diagnosis not present

## 2024-08-07 DIAGNOSIS — E66812 Obesity, class 2: Secondary | ICD-10-CM | POA: Diagnosis not present

## 2024-08-07 LAB — RENAL FUNCTION PANEL
Albumin: 2.6 g/dL — ABNORMAL LOW (ref 3.5–5.0)
Anion gap: 10 (ref 5–15)
BUN: 8 mg/dL (ref 8–23)
CO2: 21 mmol/L — ABNORMAL LOW (ref 22–32)
Calcium: 8.1 mg/dL — ABNORMAL LOW (ref 8.9–10.3)
Chloride: 105 mmol/L (ref 98–111)
Creatinine, Ser: 0.84 mg/dL (ref 0.61–1.24)
GFR, Estimated: >60 mL/min (ref >60)
Glucose, Bld: 96 mg/dL (ref 70–99)
Phosphorus: 2.9 mg/dL (ref 2.5–4.6)
Potassium: 3.8 mmol/L (ref 3.5–5.1)
Sodium: 136 mmol/L (ref 135–145)

## 2024-08-07 LAB — CBC
HCT: 40.4 % (ref 39.0–52.0)
Hemoglobin: 13.4 g/dL (ref 13.0–17.0)
MCH: 29.2 pg (ref 26.0–34.0)
MCHC: 33.2 g/dL (ref 30.0–36.0)
MCV: 88 fL (ref 80.0–100.0)
Platelets: 229 K/uL (ref 150–400)
RBC: 4.59 MIL/uL (ref 4.22–5.81)
RDW: 12.7 % (ref 11.5–15.5)
WBC: 9.2 K/uL (ref 4.0–10.5)
nRBC: 0 % (ref 0.0–0.2)

## 2024-08-07 LAB — GLUCOSE, CAPILLARY
Glucose-Capillary: 106 mg/dL — ABNORMAL HIGH (ref 70–99)
Glucose-Capillary: 107 mg/dL — ABNORMAL HIGH (ref 70–99)
Glucose-Capillary: 111 mg/dL — ABNORMAL HIGH (ref 70–99)
Glucose-Capillary: 116 mg/dL — ABNORMAL HIGH (ref 70–99)

## 2024-08-07 LAB — MAGNESIUM: Magnesium: 1.7 mg/dL (ref 1.7–2.4)

## 2024-08-07 MED ORDER — LACTATED RINGERS IV SOLN: INTRAVENOUS | Status: AC

## 2024-08-07 MED ORDER — METOCLOPRAMIDE HCL 5 MG/ML IJ SOLN
10.0000 mg | Freq: Once | INTRAMUSCULAR | Status: AC
  Administered 2024-08-07: 10 mg via INTRAVENOUS
  Filled 2024-08-07: qty 2

## 2024-08-07 MED ORDER — METOCLOPRAMIDE HCL 5 MG/ML IJ SOLN: 5.0000 mg | Freq: Four times a day (QID) | INTRAMUSCULAR | Status: DC | PRN

## 2024-08-07 NOTE — Progress Notes (Signed)
 2 Days Post-Op  Subjective: CC: Reports soreness around his incisions. Some nausea. NGT w/ 375cc/24 hours. No flatus or BM. Voiding. Mobilizing.   Objective: Vital signs in last 24 hours: Temp:  [97.5 F (36.4 C)-98.1 F (36.7 C)] 97.9 F (36.6 C) (08/24 0751) Pulse Rate:  [68-108] 68 (08/24 0751) Resp:  [16-18] 18 (08/24 0751) BP: (133-145)/(77-91) 136/91 (08/24 0751) SpO2:  [96 %-99 %] 97 % (08/24 0751) Last BM Date : 07/28/24  Intake/Output from previous day: 08/23 0701 - 08/24 0700 In: 120 [P.O.:120] Out: 675 [Urine:300; Emesis/NG output:375] Intake/Output this shift: No intake/output data recorded.  PE: Gen:  Alert, NAD, pleasant Abd: Soft, mild distension, appropriately tender around laparoscopic incisions, no rigidity or guarding and otherwise NT, +BS. Incisions with glue intact appears well and are without drainage, bleeding, or signs of infection. NGT in place on LIWS  Lab Results:  Recent Labs    08/06/24 0902 08/07/24 0614  WBC 11.6* 9.2  HGB 14.1 13.4  HCT 43.0 40.4  PLT 271 229   BMET Recent Labs    08/06/24 0902 08/07/24 0614  NA 136 136  K 4.0 3.8  CL 102 105  CO2 22 21*  GLUCOSE 124* 96  BUN 12 8  CREATININE 0.99 0.84  CALCIUM  8.5* 8.1*   PT/INR No results for input(s): LABPROT, INR in the last 72 hours. CMP     Component Value Date/Time   NA 136 08/07/2024 0614   K 3.8 08/07/2024 0614   CL 105 08/07/2024 0614   CO2 21 (L) 08/07/2024 0614   GLUCOSE 96 08/07/2024 0614   BUN 8 08/07/2024 0614   CREATININE 0.84 08/07/2024 0614   CALCIUM  8.1 (L) 08/07/2024 0614   PROT 5.9 (L) 08/01/2024 0448   ALBUMIN 2.6 (L) 08/07/2024 0614   AST 14 (L) 08/01/2024 0448   ALT 15 08/01/2024 0448   ALKPHOS 37 (L) 08/01/2024 0448   BILITOT 1.0 08/01/2024 0448   GFRNONAA >60 08/07/2024 0614   GFRAA 114 02/13/2009 0935   Lipase     Component Value Date/Time   LIPASE 41 07/31/2024 1059    Studies/Results: DG Abd 1 View Result Date:  08/05/2024 CLINICAL DATA:  Nasogastric tube placement. EXAM: ABDOMEN - 1 VIEW COMPARISON:  Earlier today FINDINGS: Nasogastric tube remains in place with tip again seen overlying the proximal gastric body, with side port at the GE junction. No No evidence of dilated bowel loops. IMPRESSION: Nasogastric tube tip overlies the proximal gastric body, with side port at the GE junction. Electronically Signed   By: Norleen DELENA Kil M.D.   On: 08/05/2024 16:35   X-ray abdomen AP Result Date: 08/05/2024 CLINICAL DATA:  450301 Surgery follow-up 450301. EXAM: ABDOMEN - 1 VIEW COMPARISON:  08/05/2024, 5:42 a.m. FINDINGS: The bowel gas pattern is non-obstructive. No evidence of pneumoperitoneum. No acute osseous abnormalities. There is small right pleural effusion, blunting the right lateral costophrenic angle. Surgical changes, devices, tubes and lines: There is a new enteric tube coursing below the left hemidiaphragm with its tip overlying the left upper quadrant, overlying the fundus of the stomach region. However, the side hole is at the level of left hemidiaphragm, overlying the GE junction region. Recommend further advancement by 2-3 inches to confidently put the side hole into the stomach. IMPRESSION: *There is a new enteric tube coursing below the left hemidiaphragm with its tip overlying the left upper quadrant, overlying the fundus of the stomach region. However, the side hole is at the level of  left hemidiaphragm, overlying the GE junction region. Recommend further advancement by 2-3 inches to confidently put the side hole into the stomach. Electronically Signed   By: Ree Molt M.D.   On: 08/05/2024 10:04    Anti-infectives: Anti-infectives (From admission, onward)    Start     Dose/Rate Route Frequency Ordered Stop   08/02/24 1600  cefTRIAXone  (ROCEPHIN ) 2 g in sodium chloride  0.9 % 100 mL IVPB        2 g 200 mL/hr over 30 Minutes Intravenous Every 24 hours 08/02/24 0957     08/01/24 0300  ciprofloxacin   (CIPRO ) IVPB 400 mg  Status:  Discontinued        400 mg 200 mL/hr over 60 Minutes Intravenous Every 12 hours 07/31/24 1545 08/02/24 0957   08/01/24 0300  metroNIDAZOLE  (FLAGYL ) IVPB 500 mg        500 mg 100 mL/hr over 60 Minutes Intravenous Every 12 hours 07/31/24 1545     07/31/24 1400  ciprofloxacin  (CIPRO ) IVPB 400 mg        400 mg 200 mL/hr over 60 Minutes Intravenous  Once 07/31/24 1347 07/31/24 1620   07/31/24 1400  metroNIDAZOLE  (FLAGYL ) IVPB 500 mg        500 mg 100 mL/hr over 60 Minutes Intravenous  Once 07/31/24 1347 07/31/24 1547        Assessment/Plan POD 2 s/p diagnostic laparoscopy on 8/22 by Dr. Polly - Patient presented with short segment simple sigmoid diverticulitis and a small bowel obstruction in the setting of no prior abdominal surgeries. Patient failed diet advancement with n/v requiring NGT and was taken to the OR on 8/22 for diagnostic laparoscopy - Intra-op findings: Mildly dilated small bowel without a clear transition point. There were a few flimsy adhesions in the RLQ. Signs of uncomplicated diverticulitis  - Cont NGT, AROBF. Consider TPN in coming days if no ROBF - Cont abx for diverticulitis. Recommend colonoscopy in 6 weeks after discharge.  - Mobilize, pulm toilet  FEN - NPO, NGT to LIWS, IVF per TRH VTE - SCDs, Lovenox  ID - Rocephin /Flagyl  Foley - None, spont void    LOS: 7 days    Brett Stanley, [facility] Surgery 08/07/2024, 8:41 AM Please see Amion for pager number during day hours 7:00am-4:30pm

## 2024-08-07 NOTE — Progress Notes (Signed)
 PROGRESS NOTE  Brett Stanley FMW:[id number] DOB: [birth year]   PCP: [name], MD  Patient is from: Home.  Lives with his wife.  Independently ambulates at baseline.  DOA: 07/31/2024 LOS: 7  Chief complaints Chief Complaint  Patient presents with   Diarrhea     Brief Narrative / Interim history: 68 year old M with PMH of OSA not on CPAP, DM-2, HTN and morbid obesity presenting with abdominal pain and distention for about 5 days, and admitted with small bowel obstruction and sigmoid diverticulitis.  Recently diagnosed with viral gastroenteritis after he presented there with abdominal discomfort, diarrhea and vomiting.  No prior abdominal surgery.  Colonoscopy in 2021 with two 8 to 10 mm polyps.  CT abdomen and pelvis showed SBO with transition point in right hemiabdomen and uncomplicated acute sigmoid diverticulitis without perforation.  Patient was started on IV antibiotics.  Surgery consulted and following.  Patient had a setback after initial improvement and required NG tube placement on 8/21.  After he failed conservative intervention, he underwent diagnostic laparoscopy on 8/22 that showed mildly dilated small bowel without a clear transition point but few flimsy adhesions in RLQ.  Subjective: No major events overnight of this morning.  No major events overnight or this morning.  Reports passing small flatus this morning.  No bowel movements.  Felt queasy when he got up this morning.  Ambulating in the hallway.  Objective: Vitals:   08/06/24 1939 08/06/24 1941 08/07/24 0525 08/07/24 0751  BP: (!) 145/84 133/89 (!) 142/86 (!) 136/91  Pulse: 70 (!) 108 70 68  Resp: 16 16 16 18   Temp: (!) 97.5 F (36.4 C) 98 F (36.7 C) 97.6 F (36.4 C) 97.9 F (36.6 C)  TempSrc: Oral  Oral Oral  SpO2: 98% 96% 97% 97%  Weight:      Height:        Examination:  GENERAL: No apparent distress.  Nontoxic. HEENT: MMM.  Vision and hearing grossly intact.  NG tube clamped NECK: Supple.   No apparent JVD.  RESP:  No IWOB.  Fair aeration bilaterally. CVS:  RRR. Heart sounds normal.  ABD/GI/GU: BS+. Abd soft lightly distended.  Nontender. MSK/EXT:   No apparent deformity. Moves extremities. No edema.  SKIN: no apparent skin lesion or wound NEURO: Awake and alert. Oriented appropriately.  No apparent focal neuro deficit. PSYCH: Calm. Normal affect.   Consultants:  General surgery  Procedures: 8/22-diagnostic laparoscopy  Microbiology summarized: None  Assessment and plan: Ileus versus small bowel obstruction: Presents with abdominal pain and distention preceded by nausea, vomiting and diarrhea earlier in the week.  CT showed SBO with transition point in right hemiabdomen.  Had setback after initial improvement requiring NG tube placement.  Failed conservative measures. -S/p diagnostic laparoscopy by Dr. Polly on 8/22 -Appreciate help by general surgery-n.p.o., NGT and ambulate -Continue IV fluid -Continue IV Protonix  40 mg twice daily -Change Zofran  to Reglan   Acute sigmoid diverticulitis: Nontender. - Cipro  and Flagyl >> ceftriaxone  and Flagyl   NIDDM-2 with hyperglycemia: A1c 7.9% in 03/2024.  On metformin , glipizide  and Ozempic . Recent Labs  Lab 08/06/24 0552 08/06/24 1217 08/06/24 1733 08/07/24 0005 08/07/24 0601  GLUCAP 122* 128* 129* 111* 107*  -SSI-very sensitive every 6 hours  Essential hypertension: Normotensive -Continue holding home lisinopril   Hyponatremia: Resolved.  Leukocytosis: Likely due to diverticulitis.  Resolved  History of OSA: Not on CPAP  Hypokalemia -Monitor replenish K and Mg as appropriate   Morbid obesity: Elevated BMI with diabetes and hypertension. Body mass index  is 38.38 kg/m.          DVT prophylaxis:  enoxaparin  (LOVENOX ) injection 40 mg Start: 08/01/24 1000  Code Status: Full code Family Communication: None at bedside today. Level of care: Med-Surg Status is: Inpatient Remains inpatient appropriate  because: Ileus versus SBO   Final disposition: Home once medically stable   35 minutes with more than 50% spent in reviewing records, counseling patient/family and coordinating care.   Sch Meds:  Scheduled Meds:  enoxaparin  (LOVENOX ) injection  40 mg Subcutaneous Q24H   insulin  aspart  0-6 Units Subcutaneous Q6H   metoCLOPramide  (REGLAN ) injection  10 mg Intravenous Once   pantoprazole  (PROTONIX ) IV  40 mg Intravenous Q12H   sodium chloride  flush  3 mL Intravenous Q12H   Continuous Infusions:  acetaminophen  1,000 mg (08/07/24 9392)   cefTRIAXone  (ROCEPHIN )  IV 2 g (08/06/24 1741)   lactated ringers  125 mL/hr at 08/07/24 0926   metronidazole  500 mg (08/07/24 0312)   PRN Meds:.albuterol , hydrALAZINE , methocarbamol  (ROBAXIN ) injection, metoCLOPramide  (REGLAN ) injection **FOLLOWED BY** metoCLOPramide  (REGLAN ) injection, morphine  injection, phenol  Antimicrobials: Anti-infectives (From admission, onward)    Start     Dose/Rate Route Frequency Ordered Stop   08/02/24 1600  cefTRIAXone  (ROCEPHIN ) 2 g in sodium chloride  0.9 % 100 mL IVPB        2 g 200 mL/hr over 30 Minutes Intravenous Every 24 hours 08/02/24 0957     08/01/24 0300  ciprofloxacin  (CIPRO ) IVPB 400 mg  Status:  Discontinued        400 mg 200 mL/hr over 60 Minutes Intravenous Every 12 hours 07/31/24 1545 08/02/24 0957   08/01/24 0300  metroNIDAZOLE  (FLAGYL ) IVPB 500 mg        500 mg 100 mL/hr over 60 Minutes Intravenous Every 12 hours 07/31/24 1545     07/31/24 1400  ciprofloxacin  (CIPRO ) IVPB 400 mg        400 mg 200 mL/hr over 60 Minutes Intravenous  Once 07/31/24 1347 07/31/24 1620   07/31/24 1400  metroNIDAZOLE  (FLAGYL ) IVPB 500 mg        500 mg 100 mL/hr over 60 Minutes Intravenous  Once 07/31/24 1347 07/31/24 1547        I have personally reviewed the following labs and images: CBC: Recent Labs  Lab 08/03/24 0306 08/04/24 0544 08/05/24 1213 08/06/24 0902 08/07/24 0614  WBC 9.9 9.6 12.4* 11.6* 9.2   HGB 14.0 14.6 14.9 14.1 13.4  HCT 42.5 44.2 47.1 43.0 40.4  MCV 86.9 88.6 92.7 88.3 88.0  PLT 244 269 216 271 229   BMP &GFR Recent Labs  Lab 08/03/24 0306 08/04/24 0544 08/05/24 1213 08/06/24 0902 08/07/24 0614  NA 134* 139 141 136 136  K 4.7 4.4 4.4 4.0 3.8  CL 98 100 106 102 105  CO2 26 22 19* 22 21*  GLUCOSE 163* 153* 174* 124* 96  BUN 7* 11 12 12 8   CREATININE 0.95 0.93 0.99 0.99 0.84  CALCIUM  9.0 8.8* 8.5* 8.5* 8.1*  MG 1.8 1.8 1.8 1.9 1.7  PHOS 3.3 3.8 3.6 2.9 2.9   Estimated Creatinine Clearance: 109.6 mL/min (by C-G formula based on SCr of 0.84 mg/dL). Liver & Pancreas: Recent Labs  Lab 07/31/24 1059 08/01/24 0448 08/02/24 1000 08/03/24 0306 08/04/24 0544 08/05/24 1213 08/06/24 0902 08/07/24 0614  AST 20 14*  --   --   --   --   --   --   ALT 16 15  --   --   --   --   --   --  ALKPHOS 48 37*  --   --   --   --   --   --   BILITOT 1.3* 1.0  --   --   --   --   --   --   PROT 7.1 5.9*  --   --   --   --   --   --   ALBUMIN 3.3* 2.7*   < > 3.0* 3.1* 3.1* 2.8* 2.6*   < > = values in this interval not displayed.   Recent Labs  Lab 07/31/24 1059  LIPASE 41   No results for input(s): AMMONIA in the last 168 hours. Diabetic: No results for input(s): HGBA1C in the last 72 hours. Recent Labs  Lab 08/06/24 0552 08/06/24 1217 08/06/24 1733 08/07/24 0005 08/07/24 0601  GLUCAP 122* 128* 129* 111* 107*   Cardiac Enzymes: No results for input(s): CKTOTAL, CKMB, CKMBINDEX, TROPONINI in the last 168 hours. No results for input(s): PROBNP in the last 8760 hours. Coagulation Profile: No results for input(s): INR, PROTIME in the last 168 hours. Thyroid Function Tests: No results for input(s): TSH, T4TOTAL, FREET4, T3FREE, THYROIDAB in the last 72 hours. Lipid Profile: No results for input(s): CHOL, HDL, LDLCALC, TRIG, CHOLHDL, LDLDIRECT in the last 72 hours. Anemia Panel: No results for input(s): VITAMINB12,  FOLATE, FERRITIN, TIBC, IRON, RETICCTPCT in the last 72 hours. Urine analysis:    Component Value Date/Time   COLORURINE AMBER (A) 07/31/2024 1052   APPEARANCEUR HAZY (A) 07/31/2024 1052   LABSPEC 1.035 (H) 07/31/2024 1052   PHURINE 5.0 07/31/2024 1052   GLUCOSEU >=500 (A) 07/31/2024 1052   HGBUR NEGATIVE 07/31/2024 1052   BILIRUBINUR SMALL (A) 07/31/2024 1052   BILIRUBINUR moderate (A) 07/31/2024 0907   KETONESUR 5 (A) 07/31/2024 1052   KETONESUR trace (5) (A) 07/31/2024 0907   PROTEINUR 100 (A) 07/31/2024 1052   UROBILINOGEN 1.0 07/31/2024 0907   NITRITE NEGATIVE 07/31/2024 1052   NITRITE Negative 07/31/2024 0907   LEUKOCYTESUR NEGATIVE 07/31/2024 1052   LEUKOCYTESUR Negative 07/31/2024 0907   Sepsis Labs: Invalid input(s): PROCALCITONIN, LACTICIDVEN  Microbiology: Recent Results (from the past 240 hours)  Surgical pcr screen     Status: None   Collection Time: 08/05/24  4:35 AM   Specimen: Nasal Mucosa; Nasal Swab  Result Value Ref Range Status   MRSA, PCR NEGATIVE NEGATIVE Final   Staphylococcus aureus NEGATIVE NEGATIVE Final    Comment: (NOTE) The Xpert SA Assay (FDA approved for NASAL specimens in patients 68 years of age and older), is one component of a comprehensive surveillance program. It is not intended to diagnose infection nor to guide or monitor treatment. Performed at [facility], [address], [place], [place] [address]     Radiology Studies: No results found.      Rosezella Kronick T. Chandelle Harkey [facility]  If 7PM-7AM, please contact night-coverage [email or web address] 08/07/2024, 9:49 AM

## 2024-08-08 ENCOUNTER — Ambulatory Visit: Admitting: Family

## 2024-08-08 ENCOUNTER — Encounter (HOSPITAL_COMMUNITY): Payer: Self-pay | Admitting: General Surgery

## 2024-08-08 DIAGNOSIS — D72825 Bandemia: Secondary | ICD-10-CM | POA: Diagnosis not present

## 2024-08-08 DIAGNOSIS — E66812 Obesity, class 2: Secondary | ICD-10-CM | POA: Diagnosis not present

## 2024-08-08 DIAGNOSIS — K5732 Diverticulitis of large intestine without perforation or abscess without bleeding: Secondary | ICD-10-CM | POA: Diagnosis not present

## 2024-08-08 DIAGNOSIS — K56609 Unspecified intestinal obstruction, unspecified as to partial versus complete obstruction: Secondary | ICD-10-CM | POA: Diagnosis not present

## 2024-08-08 LAB — RENAL FUNCTION PANEL
Albumin: 2.5 g/dL — ABNORMAL LOW (ref 3.5–5.0)
Anion gap: 10 (ref 5–15)
BUN: 7 mg/dL — ABNORMAL LOW (ref 8–23)
CO2: 21 mmol/L — ABNORMAL LOW (ref 22–32)
Calcium: 8.1 mg/dL — ABNORMAL LOW (ref 8.9–10.3)
Chloride: 103 mmol/L (ref 98–111)
Creatinine, Ser: 0.87 mg/dL (ref 0.61–1.24)
GFR, Estimated: >60 mL/min (ref >60)
Glucose, Bld: 88 mg/dL (ref 70–99)
Phosphorus: 3.1 mg/dL (ref 2.5–4.6)
Potassium: 3.9 mmol/L (ref 3.5–5.1)
Sodium: 134 mmol/L — ABNORMAL LOW (ref 135–145)

## 2024-08-08 LAB — CBC
HCT: 40.7 % (ref 39.0–52.0)
Hemoglobin: 13.4 g/dL (ref 13.0–17.0)
MCH: 28.7 pg (ref 26.0–34.0)
MCHC: 32.9 g/dL (ref 30.0–36.0)
MCV: 87.2 fL (ref 80.0–100.0)
Platelets: 241 K/uL (ref 150–400)
RBC: 4.67 MIL/uL (ref 4.22–5.81)
RDW: 12.7 % (ref 11.5–15.5)
WBC: 9.8 K/uL (ref 4.0–10.5)
nRBC: 0 % (ref 0.0–0.2)

## 2024-08-08 LAB — GLUCOSE, CAPILLARY
Glucose-Capillary: 125 mg/dL — ABNORMAL HIGH (ref 70–99)
Glucose-Capillary: 172 mg/dL — ABNORMAL HIGH (ref 70–99)
Glucose-Capillary: 89 mg/dL (ref 70–99)
Glucose-Capillary: 90 mg/dL (ref 70–99)

## 2024-08-08 LAB — MAGNESIUM: Magnesium: 1.7 mg/dL (ref 1.7–2.4)

## 2024-08-08 MED ORDER — BISACODYL 10 MG RE SUPP: 10.0000 mg | Freq: Every day | RECTAL | Status: DC | PRN

## 2024-08-08 MED ORDER — ENSURE SURGERY PO LIQD
237.0000 mL | Freq: Two times a day (BID) | ORAL | Status: DC
  Administered 2024-08-08 – 2024-08-09 (×2): 237 mL via ORAL
  Filled 2024-08-08 (×3): qty 237

## 2024-08-08 NOTE — Progress Notes (Signed)
 PROGRESS NOTE  Brett Stanley FMW:[id number] DOB: 1956-03-18   PCP: [name], MD  Patient is from: Home.  Lives with his wife.  Independently ambulates at baseline.  DOA: 07/31/2024 LOS: 8  Chief complaints Chief Complaint  Patient presents with   Diarrhea     Brief Narrative / Interim history: 68 year old M with PMH of OSA not on CPAP, DM-2, HTN and morbid obesity presenting with abdominal pain and distention for about 5 days, and admitted with small bowel obstruction and sigmoid diverticulitis.  Recently diagnosed with viral gastroenteritis after he presented there with abdominal discomfort, diarrhea and vomiting.  No prior abdominal surgery.  Colonoscopy in 2021 with two 8 to 10 mm polyps.  CT abdomen and pelvis showed SBO with transition point in right hemiabdomen and uncomplicated acute sigmoid diverticulitis without perforation.  Patient was started on IV antibiotics.  Surgery consulted and following.  Patient had a setback after initial improvement and required NG tube placement on 8/21.  After he failed conservative intervention, he underwent diagnostic laparoscopy on 8/22 that showed mildly dilated small bowel without a clear transition point but few flimsy adhesions in RLQ.  Subjective: No major events overnight of this morning.  No major events overnight or this morning.  No major events overnight or this morning.  He looks happy that he is passing gas and having bowel movements now.  Objective: Vitals:   08/07/24 1816 08/07/24 2127 08/08/24 0616 08/08/24 0736  BP: 126/83 135/84 (!) 141/82 137/75  Pulse: 86 66 67 66  Resp: 18 20 18 16   Temp: 98.1 F (36.7 C) 97.6 F (36.4 C) (!) 97.5 F (36.4 C) 98.1 F (36.7 C)  TempSrc:  Oral Oral Oral  SpO2: 98% 97% 98% 96%  Weight:      Height:        Examination:  GENERAL: No apparent distress.  Nontoxic. HEENT: MMM.  Vision and hearing grossly intact.  NG tube clamped NECK: Supple.  No apparent JVD.  RESP:  No  IWOB.  Fair aeration bilaterally. CVS:  RRR. Heart sounds normal.  ABD/GI/GU: BS+. Abd soft.  Nontender. MSK/EXT:   No apparent deformity. Moves extremities. No edema.  SKIN: no apparent skin lesion or wound NEURO: Awake and alert. Oriented appropriately.  No apparent focal neuro deficit. PSYCH: Calm. Normal affect.   Consultants:  General surgery  Procedures: 8/22-diagnostic laparoscopy  Microbiology summarized: None  Assessment and plan: Ileus versus small bowel obstruction: Presents with abdominal pain and distention preceded by nausea, vomiting and diarrhea earlier in the week.  CT showed SBO with transition point in right hemiabdomen.  Had setback after initial improvement requiring NG tube placement.  Failed conservative measures. -S/p diagnostic laparoscopy by Dr. Polly on 8/22.  No clear obstruction noted. -Had a bowel movement and passing gas this morning. -Appreciate help by general surgery-CLD and NGT clamping trial. -Continue IV Protonix  40 mg twice daily -Continue Reglan   Acute sigmoid diverticulitis: Nontender. - Cipro  and Flagyl >> ceftriaxone  and Flagyl   NIDDM-2 with hyperglycemia: A1c 7.9% in 03/2024.  On metformin , glipizide  and Ozempic . Recent Labs  Lab 08/07/24 1150 08/07/24 1815 08/08/24 0004 08/08/24 0617 08/08/24 1206  GLUCAP 116* 106* 90 89 125*  -SSI-very sensitive every 6 hours  Essential hypertension: Normotensive -Continue holding home lisinopril   Hyponatremia: Mild - Monitor  Leukocytosis: Likely due to diverticulitis.  Resolved  History of OSA: Not on CPAP  Hypokalemia -Monitor replenish K and Mg as appropriate   Morbid obesity: Elevated BMI with diabetes and  hypertension. Body mass index is 38.38 kg/m.          DVT prophylaxis:  enoxaparin  (LOVENOX ) injection 40 mg Start: 08/01/24 1000  Code Status: Full code Family Communication: None at bedside today. Level of care: Med-Surg Status is: Inpatient Remains inpatient  appropriate because: Ileus versus SBO   Final disposition: Home once medically stable   35 minutes with more than 50% spent in reviewing records, counseling patient/family and coordinating care.   Sch Meds:  Scheduled Meds:  enoxaparin  (LOVENOX ) injection  40 mg Subcutaneous Q24H   insulin  aspart  0-6 Units Subcutaneous Q6H   pantoprazole  (PROTONIX ) IV  40 mg Intravenous Q12H   sodium chloride  flush  3 mL Intravenous Q12H   Continuous Infusions:  cefTRIAXone  (ROCEPHIN )  IV 2 g (08/07/24 1655)   metronidazole  500 mg (08/08/24 0316)   PRN Meds:.albuterol , hydrALAZINE , methocarbamol  (ROBAXIN ) injection, [COMPLETED] metoCLOPramide  (REGLAN ) injection **FOLLOWED BY** metoCLOPramide  (REGLAN ) injection, morphine  injection, phenol  Antimicrobials: Anti-infectives (From admission, onward)    Start     Dose/Rate Route Frequency Ordered Stop   08/02/24 1600  cefTRIAXone  (ROCEPHIN ) 2 g in sodium chloride  0.9 % 100 mL IVPB        2 g 200 mL/hr over 30 Minutes Intravenous Every 24 hours 08/02/24 0957     08/01/24 0300  ciprofloxacin  (CIPRO ) IVPB 400 mg  Status:  Discontinued        400 mg 200 mL/hr over 60 Minutes Intravenous Every 12 hours 07/31/24 1545 08/02/24 0957   08/01/24 0300  metroNIDAZOLE  (FLAGYL ) IVPB 500 mg        500 mg 100 mL/hr over 60 Minutes Intravenous Every 12 hours 07/31/24 1545     07/31/24 1400  ciprofloxacin  (CIPRO ) IVPB 400 mg        400 mg 200 mL/hr over 60 Minutes Intravenous  Once 07/31/24 1347 07/31/24 1620   07/31/24 1400  metroNIDAZOLE  (FLAGYL ) IVPB 500 mg        500 mg 100 mL/hr over 60 Minutes Intravenous  Once 07/31/24 1347 07/31/24 1547        I have personally reviewed the following labs and images: CBC: Recent Labs  Lab 08/04/24 0544 08/05/24 1213 08/06/24 0902 08/07/24 0614 08/08/24 0655  WBC 9.6 12.4* 11.6* 9.2 9.8  HGB 14.6 14.9 14.1 13.4 13.4  HCT 44.2 47.1 43.0 40.4 40.7  MCV 88.6 92.7 88.3 88.0 87.2  PLT 269 216 271 229 241   BMP  &GFR Recent Labs  Lab 08/04/24 0544 08/05/24 1213 08/06/24 0902 08/07/24 0614 08/08/24 0655  NA 139 141 136 136 134*  K 4.4 4.4 4.0 3.8 3.9  CL 100 106 102 105 103  CO2 22 19* 22 21* 21*  GLUCOSE 153* 174* 124* 96 88  BUN 11 12 12 8  7*  CREATININE 0.93 0.99 0.99 0.84 0.87  CALCIUM  8.8* 8.5* 8.5* 8.1* 8.1*  MG 1.8 1.8 1.9 1.7 1.7  PHOS 3.8 3.6 2.9 2.9 3.1   Estimated Creatinine Clearance: 105.8 mL/min (by C-G formula based on SCr of 0.87 mg/dL). Liver & Pancreas: Recent Labs  Lab 08/04/24 0544 08/05/24 1213 08/06/24 0902 08/07/24 0614 08/08/24 0655  ALBUMIN 3.1* 3.1* 2.8* 2.6* 2.5*   No results for input(s): LIPASE, AMYLASE in the last 168 hours.  No results for input(s): AMMONIA in the last 168 hours. Diabetic: No results for input(s): HGBA1C in the last 72 hours. Recent Labs  Lab 08/07/24 1150 08/07/24 1815 08/08/24 0004 08/08/24 0617 08/08/24 1206  GLUCAP 116* 106* 90  89 125*   Cardiac Enzymes: No results for input(s): CKTOTAL, CKMB, CKMBINDEX, TROPONINI in the last 168 hours. No results for input(s): PROBNP in the last 8760 hours. Coagulation Profile: No results for input(s): INR, PROTIME in the last 168 hours. Thyroid Function Tests: No results for input(s): TSH, T4TOTAL, FREET4, T3FREE, THYROIDAB in the last 72 hours. Lipid Profile: No results for input(s): CHOL, HDL, LDLCALC, TRIG, CHOLHDL, LDLDIRECT in the last 72 hours. Anemia Panel: No results for input(s): VITAMINB12, FOLATE, FERRITIN, TIBC, IRON, RETICCTPCT in the last 72 hours. Urine analysis:    Component Value Date/Time   COLORURINE AMBER (A) 07/31/2024 1052   APPEARANCEUR HAZY (A) 07/31/2024 1052   LABSPEC 1.035 (H) 07/31/2024 1052   PHURINE 5.0 07/31/2024 1052   GLUCOSEU >=500 (A) 07/31/2024 1052   HGBUR NEGATIVE 07/31/2024 1052   BILIRUBINUR SMALL (A) 07/31/2024 1052   BILIRUBINUR moderate (A) 07/31/2024 0907   KETONESUR 5 (A)  07/31/2024 1052   KETONESUR trace (5) (A) 07/31/2024 0907   PROTEINUR 100 (A) 07/31/2024 1052   UROBILINOGEN 1.0 07/31/2024 0907   NITRITE NEGATIVE 07/31/2024 1052   NITRITE Negative 07/31/2024 0907   LEUKOCYTESUR NEGATIVE 07/31/2024 1052   LEUKOCYTESUR Negative 07/31/2024 0907   Sepsis Labs: Invalid input(s): PROCALCITONIN, LACTICIDVEN  Microbiology: Recent Results (from the past 240 hours)  Surgical pcr screen     Status: None   Collection Time: 08/05/24  4:35 AM   Specimen: Nasal Mucosa; Nasal Swab  Result Value Ref Range Status   MRSA, PCR NEGATIVE NEGATIVE Final   Staphylococcus aureus NEGATIVE NEGATIVE Final    Comment: (NOTE) The Xpert SA Assay (FDA approved for NASAL specimens in patients 12 years of age and older), is one component of a comprehensive surveillance program. It is not intended to diagnose infection nor to guide or monitor treatment. Performed at [facility], [address] [place], [place] [address]     Radiology Studies: No results found.      Chevez Sambrano T. Jalien Weakland [facility]  If 7PM-7AM, please contact night-coverage [email or web address] 08/08/2024, 12:09 PM

## 2024-08-08 NOTE — Plan of Care (Signed)

## 2024-08-08 NOTE — Progress Notes (Signed)
 3 Days Post-Op  Subjective: CC: Nausea resolved. He reports sensation of lower abdominal that he needs to have a BM but otherwise no abdominal pain. Distension improved. He is now passing flatus. No BM. Mobilizing. Voiding. NGT w/ 975cc/24 hours.   Objective: Vital signs in last 24 hours: Temp:  [97.5 F (36.4 C)-98.1 F (36.7 C)] 98.1 F (36.7 C) (08/25 0736) Pulse Rate:  [66-86] 66 (08/25 0736) Resp:  [16-20] 16 (08/25 0736) BP: (126-141)/(75-84) 137/75 (08/25 0736) SpO2:  [96 %-98 %] 96 % (08/25 0736) Last BM Date : 07/28/24  Intake/Output from previous day: 08/24 0701 - 08/25 0700 In: 1730 [P.O.:130; I.V.:1500; IV Piggyback:100] Out: 1525 [Urine:550; Emesis/NG output:975] Intake/Output this shift: No intake/output data recorded.  PE: Gen:  Alert, NAD, pleasant Abd: Soft, improved mild distension, NT today, +BS. Incisions with glue intact appears well and are without drainage, bleeding, or signs of infection. NGT in place on LIWS  Lab Results:  Recent Labs    08/07/24 0614 08/08/24 0655  WBC 9.2 9.8  HGB 13.4 13.4  HCT 40.4 40.7  PLT 229 241   BMET Recent Labs    08/07/24 0614 08/08/24 0655  NA 136 134*  K 3.8 3.9  CL 105 103  CO2 21* 21*  GLUCOSE 96 88  BUN 8 7*  CREATININE 0.84 0.87  CALCIUM  8.1* 8.1*   PT/INR No results for input(s): LABPROT, INR in the last 72 hours. CMP     Component Value Date/Time   NA 134 (L) 08/08/2024 0655   K 3.9 08/08/2024 0655   CL 103 08/08/2024 0655   CO2 21 (L) 08/08/2024 0655   GLUCOSE 88 08/08/2024 0655   BUN 7 (L) 08/08/2024 0655   CREATININE 0.87 08/08/2024 0655   CALCIUM  8.1 (L) 08/08/2024 0655   PROT 5.9 (L) 08/01/2024 0448   ALBUMIN 2.5 (L) 08/08/2024 0655   AST 14 (L) 08/01/2024 0448   ALT 15 08/01/2024 0448   ALKPHOS 37 (L) 08/01/2024 0448   BILITOT 1.0 08/01/2024 0448   GFRNONAA >60 08/08/2024 0655   GFRAA 114 02/13/2009 0935   Lipase     Component Value Date/Time   LIPASE 41  07/31/2024 1059    Studies/Results: No results found.   Anti-infectives: Anti-infectives (From admission, onward)    Start     Dose/Rate Route Frequency Ordered Stop   08/02/24 1600  cefTRIAXone  (ROCEPHIN ) 2 g in sodium chloride  0.9 % 100 mL IVPB        2 g 200 mL/hr over 30 Minutes Intravenous Every 24 hours 08/02/24 0957     08/01/24 0300  ciprofloxacin  (CIPRO ) IVPB 400 mg  Status:  Discontinued        400 mg 200 mL/hr over 60 Minutes Intravenous Every 12 hours 07/31/24 1545 08/02/24 0957   08/01/24 0300  metroNIDAZOLE  (FLAGYL ) IVPB 500 mg        500 mg 100 mL/hr over 60 Minutes Intravenous Every 12 hours 07/31/24 1545     07/31/24 1400  ciprofloxacin  (CIPRO ) IVPB 400 mg        400 mg 200 mL/hr over 60 Minutes Intravenous  Once 07/31/24 1347 07/31/24 1620   07/31/24 1400  metroNIDAZOLE  (FLAGYL ) IVPB 500 mg        500 mg 100 mL/hr over 60 Minutes Intravenous  Once 07/31/24 1347 07/31/24 1547        Assessment/Plan POD 3 s/p diagnostic laparoscopy on 8/22 by Dr. Polly - Patient presented with short segment simple sigmoid  diverticulitis and a small bowel obstruction in the setting of no prior abdominal surgeries. Patient failed diet advancement with n/v requiring NGT and was taken to the OR on 8/22 for diagnostic laparoscopy - Intra-op findings: Mildly dilated small bowel without a clear transition point. There were a few flimsy adhesions in the RLQ. Signs of uncomplicated diverticulitis  - Clamp NGT, CLD. Possible removal of NGT later today if doing well. Discussed with RN. If does not tolerated NGT clamping trial and needs to go back to LIWS, would recommend starting TPN.  - Likely can stop abx for diverticulitis. Will review with attending. Recommend colonoscopy in 6 weeks after discharge.  - Mobilize, pulm toilet  FEN - NPO, NGT to LIWS, IVF per [facility] VTE - SCDs, Lovenox  ID - Rocephin /Flagyl  Foley - None, spont void    LOS: 8 days    Brett Stanley, [facility] Surgery 08/08/2024, 8:38 AM Please see Amion for pager number during day hours 7:00am-4:30pm

## 2024-08-09 DIAGNOSIS — K56609 Unspecified intestinal obstruction, unspecified as to partial versus complete obstruction: Secondary | ICD-10-CM | POA: Diagnosis not present

## 2024-08-09 DIAGNOSIS — E66812 Obesity, class 2: Secondary | ICD-10-CM | POA: Diagnosis not present

## 2024-08-09 DIAGNOSIS — D72825 Bandemia: Secondary | ICD-10-CM | POA: Diagnosis not present

## 2024-08-09 DIAGNOSIS — K5732 Diverticulitis of large intestine without perforation or abscess without bleeding: Secondary | ICD-10-CM | POA: Diagnosis not present

## 2024-08-09 LAB — RENAL FUNCTION PANEL
Albumin: 2.5 g/dL — ABNORMAL LOW (ref 3.5–5.0)
Anion gap: 12 (ref 5–15)
BUN: 6 mg/dL — ABNORMAL LOW (ref 8–23)
CO2: 24 mmol/L (ref 22–32)
Calcium: 8.5 mg/dL — ABNORMAL LOW (ref 8.9–10.3)
Chloride: 99 mmol/L (ref 98–111)
Creatinine, Ser: 0.88 mg/dL (ref 0.61–1.24)
GFR, Estimated: >60 mL/min (ref >60)
Glucose, Bld: 124 mg/dL — ABNORMAL HIGH (ref 70–99)
Phosphorus: 3.7 mg/dL (ref 2.5–4.6)
Potassium: 4.4 mmol/L (ref 3.5–5.1)
Sodium: 135 mmol/L (ref 135–145)

## 2024-08-09 LAB — CBC
HCT: 40.4 % (ref 39.0–52.0)
Hemoglobin: 13.2 g/dL (ref 13.0–17.0)
MCH: 28.4 pg (ref 26.0–34.0)
MCHC: 32.7 g/dL (ref 30.0–36.0)
MCV: 87.1 fL (ref 80.0–100.0)
Platelets: 230 K/uL (ref 150–400)
RBC: 4.64 MIL/uL (ref 4.22–5.81)
RDW: 12.6 % (ref 11.5–15.5)
WBC: 8.9 K/uL (ref 4.0–10.5)
nRBC: 0 % (ref 0.0–0.2)

## 2024-08-09 LAB — GLUCOSE, CAPILLARY
Glucose-Capillary: 127 mg/dL — ABNORMAL HIGH (ref 70–99)
Glucose-Capillary: 136 mg/dL — ABNORMAL HIGH (ref 70–99)

## 2024-08-09 LAB — MAGNESIUM: Magnesium: 1.8 mg/dL (ref 1.7–2.4)

## 2024-08-09 MED ORDER — POLYETHYLENE GLYCOL 3350 17 GM/SCOOP PO POWD: 17.0000 g | Freq: Two times a day (BID) | ORAL | 2 refills | Status: DC | PRN

## 2024-08-09 MED ORDER — METHOCARBAMOL 500 MG PO TABS: 500.0000 mg | ORAL_TABLET | Freq: Four times a day (QID) | ORAL | Status: DC | PRN

## 2024-08-09 MED ORDER — ACETAMINOPHEN 500 MG PO TABS: 1000.0000 mg | ORAL_TABLET | Freq: Four times a day (QID) | ORAL | Status: DC | PRN

## 2024-08-09 MED ORDER — MORPHINE SULFATE (PF) 2 MG/ML IV SOLN: 2.0000 mg | INTRAVENOUS | Status: DC | PRN

## 2024-08-09 NOTE — Discharge Instructions (Signed)

## 2024-08-09 NOTE — Discharge Summary (Signed)
 Physician Discharge Summary  Brett Stanley FMW:[id number] DOB: 03/12/1956 DOA: 07/31/2024  PCP: [name], MD  Admit date: 07/31/2024 Discharge date: 08/09/24  Admitted From: Home Disposition: Home Recommendations for Outpatient Follow-up:  Outpatient follow-up with PCP, general surgery and GI as below Check CMP and CBC at follow-up Needs colonoscopy in about 4 to 6 weeks Please follow up on the following pending results: None  Home Health: No need identified Equipment/Devices: No need identified  Discharge Condition: Stable CODE STATUS: Full code Diet Orders (From admission, onward)     Start     Ordered   08/09/24 0905  DIET SOFT Fluid consistency: Thin  Diet effective now       Question:  Fluid consistency:  Answer:  Thin   08/09/24 9094             Follow-up Information     [name], MD Follow up.   Specialty: Family Medicine Contact information: [address] [place] [address] [phone]         [facility] Follow up.   Specialty: Gastroenterology Why: Please call to arrange follow up for a colonoscopy Contact information: [address] [place]  [address] [phone]        Tari Tonja Barban, [place] Follow up on 09/01/2024.   Specialty: General Surgery Why: 9/18 at 1:45. Please bring a copy of your photo ID, insurance card and arrive 30 minutes prior to your appointment for paperwork. Contact information: [address] [place] [address] [phone]                 Hospital course 68 year old M with PMH of OSA not on CPAP, DM-2, HTN and morbid obesity presenting with abdominal pain and distention for about 5 days, and admitted with small bowel obstruction and sigmoid diverticulitis.  Recently diagnosed with viral gastroenteritis after he presented there with abdominal discomfort, diarrhea and vomiting.  No prior abdominal surgery.  Colonoscopy in  2021 with two 8 to 10 mm polyps.  CT abdomen and pelvis showed SBO with transition point in right hemiabdomen and uncomplicated acute sigmoid diverticulitis without perforation.  Patient was started on IV antibiotics.  Surgery consulted and following.  Patient had a setback after initial improvement and required NG tube placement on 8/21.   After he failed conservative intervention, he underwent diagnostic laparoscopy on 8/22 that showed mildly dilated small bowel without a clear transition point but few flimsy adhesions in RLQ suggesting ileus.  Patient was treated with NG tube decompression and eventually improved.  On the day of discharge, he tolerated soft diet and cleared for discharge by general surgery.  Completed 10 days of antibiotic course in-house for acute uncomplicated diverticulitis.  Outpatient follow-ups as above.  Advised to hold Ozempic  until he talks to his prescriber.   See individual problem list below for more.   Problems addressed during this hospitalization Ileus versus small bowel obstruction: Presents with abdominal pain and distention preceded by nausea, vomiting and diarrhea earlier in the week.  CT showed SBO with transition point in right hemiabdomen.  Had setback after initial improvement requiring NG tube placement.  Failed conservative measures and underwent diagnostic laparoscopy on 8/22 that did not show clear SBO.  Eventually improved and tolerated soft diet.  He was cleared for discharge by general surgery for outpatient follow-up.  Patient to continue soft low fiber residue diet for now and increase fiber intake slowly.  Needs colonoscopy  in about 6 weeks.  He has upcoming GI appointment.  Acute sigmoid diverticulitis: Nontender.  Tolerated soft diet. - Completed antibiotic course for 10 days in house.   NIDDM-2 with hyperglycemia: A1c 7.9% in 03/2024.  On metformin , glipizide  and Ozempic . -Continue home metformin  and glipizide . -Advised to hold his Ozempic  in  the setting of ileus until he talks to his PCP   Essential hypertension: Normotensive -Continue holding home lisinopril    Hyponatremia: Resolved.   Leukocytosis: Likely due to diverticulitis.  Resolved   History of OSA: Not on CPAP   Hypokalemia: Resolved.   Morbid obesity: Elevated BMI with diabetes and hypertension. Body mass index is 38.38 kg/m.           Consultations: General Surgery  Time spent 35  minutes  Vital signs Vitals:   08/08/24 1535 08/08/24 2055 08/09/24 0513 08/09/24 0807  BP: 135/85 (!) 143/88 128/82 130/82  Pulse: 65 73 71 64  Temp: 97.9 F (36.6 C) 97.9 F (36.6 C) 97.7 F (36.5 C) 98.1 F (36.7 C)  Resp: 16   18  Height:      Weight:      SpO2: 96% 98% 96% 97%  TempSrc: Oral Oral Oral Oral  BMI (Calculated):         Discharge exam  GENERAL: No apparent distress.  Nontoxic. HEENT: MMM.  Vision and hearing grossly intact.  NECK: Supple.  No apparent JVD.  RESP:  No IWOB.  Fair aeration bilaterally. CVS:  RRR. Heart sounds normal.  ABD/GI/GU: BS+. Abd soft, NTND.  MSK/EXT:  Moves extremities. No apparent deformity. No edema.  SKIN: no apparent skin lesion or wound NEURO: Awake and alert. Oriented appropriately.  No apparent focal neuro deficit. PSYCH: Calm. Normal affect.  Discharge Instructions Discharge Instructions     Discharge instructions   Complete by: As directed    It has been a pleasure taking care of you!  You were hospitalized with acute diverticulitis (colon infection) and possible bowel obstruction/ileus (decreased motility of your bowel muscles).  You completed antibiotic course for infection.  Your symptoms improved.  Continue soft low fiber residue diet for the next 1 to 2 weeks and slowly increase fiber content in your food.  Follow-up with your primary care doctor in 1 to 2 weeks or sooner if needed.  Follow-up with your gastroenterologist.  You will need colonoscopy in about 6 weeks   Take care,   Increase  activity slowly   Complete by: As directed    No wound care   Complete by: As directed       Allergies as of 08/09/2024       Reactions   Atorvastatin  Other (See Comments)   Joint aches   Erythromycin Nausea Only   Metformin  And Related Other (See Comments)   Bobby tolerated dose is 500mg  a day.    Oxycodone Other (See Comments)   Nausea but he can tolerate hydrocodone         Medication List     PAUSE taking these medications    Semaglutide  (1 MG/DOSE) 4 MG/3ML Sopn Wait to take this until: August 16, 2024 Inject 1 mg as directed once a week.       TAKE these medications    fexofenadine 180 MG tablet Commonly known as: ALLEGRA Take 180 mg by mouth daily.   fluticasone 50 MCG/ACT nasal spray Commonly known as: FLONASE Place 1 spray into both nostrils daily.   glipiZIDE  5 MG tablet Commonly known as: GLUCOTROL  TAKE 2  TABLETS BY MOUTH DAILY BEFORE BREAKFAST   lisinopril  5 MG tablet Commonly known as: ZESTRIL  TAKE 1 TABLET BY MOUTH DAILY   metFORMIN  500 MG tablet Commonly known as: GLUCOPHAGE  TAKE 1 TABLET BY MOUTH DAILY WITH BREAKFAST   OneTouch Verio test strip Generic drug: glucose blood Check sugar 1 time daily. E11.9   polyethylene glycol powder 17 GM/SCOOP powder Commonly known as: MiraLax  Take 17 g by mouth 2 (two) times daily as needed for moderate constipation or mild constipation.   sildenafil  20 MG tablet Commonly known as: REVATIO  TAKE 5 TABLETS BY MOUTH DAILY AS NEEDED   triamcinolone  cream 0.5 % Commonly known as: KENALOG  Apply 1 Application topically 2 (two) times daily as needed.         Procedures/Studies: 8/22-diagnostic laparoscopy    DG Abd 1 View Result Date: 08/05/2024 CLINICAL DATA:  Nasogastric tube placement. EXAM: ABDOMEN - 1 VIEW COMPARISON:  Earlier today FINDINGS: Nasogastric tube remains in place with tip again seen overlying the proximal gastric body, with side port at the GE junction. No No evidence of dilated  bowel loops. IMPRESSION: Nasogastric tube tip overlies the proximal gastric body, with side port at the GE junction. Electronically Signed   By: Norleen DELENA Kil M.D.   On: 08/05/2024 16:35   X-ray abdomen AP Result Date: 08/05/2024 CLINICAL DATA:  450301 Surgery follow-up 450301. EXAM: ABDOMEN - 1 VIEW COMPARISON:  08/05/2024, 5:42 a.m. FINDINGS: The bowel gas pattern is non-obstructive. No evidence of pneumoperitoneum. No acute osseous abnormalities. There is small right pleural effusion, blunting the right lateral costophrenic angle. Surgical changes, devices, tubes and lines: There is a new enteric tube coursing below the left hemidiaphragm with its tip overlying the left upper quadrant, overlying the fundus of the stomach region. However, the side hole is at the level of left hemidiaphragm, overlying the GE junction region. Recommend further advancement by 2-3 inches to confidently put the side hole into the stomach. IMPRESSION: *There is a new enteric tube coursing below the left hemidiaphragm with its tip overlying the left upper quadrant, overlying the fundus of the stomach region. However, the side hole is at the level of left hemidiaphragm, overlying the GE junction region. Recommend further advancement by 2-3 inches to confidently put the side hole into the stomach. Electronically Signed   By: Ree Molt M.D.   On: 08/05/2024 10:04   DG Abd Portable 1V-Small Bowel Obstruction Protocol-24 hr delay Result Date: 08/05/2024 CLINICAL DATA:  68 year old male with recent small-bowel obstruction, transition point in the right abdomen by CT. Oral contrast only in small bowel yesterday. EXAM: PORTABLE ABDOMEN - 1 VIEW COMPARISON:  Portable exams yesterday. CT Abdomen and Pelvis 07/31/2024. FINDINGS: Portable AP supine view at 0542 hours. Enteric tube terminates in the proximal stomach, side hole is not identified. Small-bowel obstruction gas pattern persists. No improvement compared to the scout view on  07/31/2024. Small volume of large bowel gas, but no large bowel contrast identified. Stable visualized osseous structures. Negative left lung base. IMPRESSION: 1. SBO with no improvement in gas pattern. No contrast in the large bowel. 2. Enteric tube terminates in the proximal stomach, side hole not identified. Electronically Signed   By: VEAR Hurst M.D.   On: 08/05/2024 09:45   DG Abd Portable 1V-Small Bowel Obstruction Protocol-initial, 8 hr delay Result Date: 08/04/2024 CLINICAL DATA:  Small bowel obstruction.  8 hour delay. EXAM: PORTABLE ABDOMEN - 1 VIEW COMPARISON:  Radiographs earlier today.  CT 07/31/2020 FINDINGS: Enteric tube tip and  side-port below the diaphragm in the stomach. Enteric contrast within dilated small bowel centrally. There is no contrast in the colon. A few air-filled dilated loop of small bowel persists in the central abdomen. IMPRESSION: Enteric contrast within dilated small bowel centrally. No contrast in the colon. Recommend 24 hour delayed film. Electronically Signed   By: Andrea Gasman M.D.   On: 08/04/2024 22:30   DG Abd 1 View Result Date: 08/04/2024 EXAM: 1 VIEW XRAY OF THE ABDOMEN 08/04/2024 05:52:11 AM COMPARISON: Abdominal series dated 08/04/2024. CLINICAL HISTORY: Encounter for nasogastric (NG) tube placement. FINDINGS: BOWEL: Nonobstructive bowel gas pattern. SOFT TISSUES: No opaque urinary calculi. BONES: No acute osseous abnormality. LINES AND TUBES: Since the previous study, a gastric tube has been placed and its tip is along the greater curvature of the mid body of the stomach. The side port of the catheter appears to be just beneath the gastroesophageal junction. IMPRESSION: 1. Gastric tube in appropriate position with tip along the greater curvature of the mid body of the stomach and side port just beneath the gastroesophageal junction. Electronically signed by: Evalene Coho MD 08/04/2024 08:47 AM EDT RP Workstation: [id number]   DG Abd Portable 1V-Small  Bowel Obstruction Protocol-initial, 8 hr delay Result Date: 08/04/2024 CLINICAL DATA:  Admitted for small bowel obstruction, evaluate 8 hours following oral contrast. EXAM: PORTABLE ABDOMEN - 1 VIEW COMPARISON:  Flat plate exam yesterday at 10:21 a.m. FINDINGS: 1:36 a.m. The stomach is moderately dilated and filled with dilute contrast. Contrast faintly opacifies dilated bowel segments in the mid to lower abdomen, little if any change noted with maximal small bowel caliber of 4.3 cm which is similar to the CT from 07/31/2024. No contrast is seen in the colon. No colonic dilatation. There is scattered gas and stool in the colon at least as far as the mid descending segment. IMPRESSION: Contrast faintly opacifies dilated small bowel segments in the mid to lower abdomen, little if any change noted with maximal small bowel caliber of 4.3 cm which is similar to the CT from 07/31/2024. No contrast is seen in the colon. Electronically Signed   By: Francis Quam M.D.   On: 08/04/2024 02:18   DG Abd 1 View Result Date: 08/03/2024 CLINICAL DATA:  Vomiting. EXAM: ABDOMEN - 1 VIEW COMPARISON:  August 01, 2024. FINDINGS: Mild small bowel dilatation is noted concerning for ileus or distal small bowel obstruction. No colonic dilatation is noted. No radio-opaque calculi or other significant radiographic abnormality are seen. IMPRESSION: Mild small bowel dilatation is noted concerning for ileus or distal small bowel obstruction. Electronically Signed   By: Lynwood Landy Raddle M.D.   On: 08/03/2024 11:18   DG Abd 1 View Result Date: 08/01/2024 CLINICAL DATA:  Follow-up small bowel obstruction EXAM: ABDOMEN - 1 VIEW COMPARISON:  07/31/2024. FINDINGS: There are multiple loops of dilated small bowel consistent with a partial obstruction, without convincing change from the previous day's exams. IMPRESSION: 1. Persistent partial small bowel obstruction. Electronically Signed   By: Alm Parkins M.D.   On: 08/01/2024 10:32   CT  ABDOMEN PELVIS W CONTRAST Result Date: 07/31/2024 EXAM: CT ABDOMEN AND PELVIS WITH CONTRAST 07/31/2024 01:01:31 PM TECHNIQUE: CT of the abdomen and pelvis was performed with the administration of intravenous contrast. Multiplanar reformatted images are provided for review. Automated exposure control, iterative reconstruction, and/or weight based adjustment of the mA/kV was utilized to reduce the radiation dose to as low as reasonably achievable. COMPARISON: None available. CLINICAL HISTORY: Bowel obstruction suspected. Pt  c/o vomiting and diarrhea x 5 days. Seen by PCP earlier in week and went back to UC today. UC sent pt for probably small bowel obstruction. FINDINGS: LOWER CHEST: No acute abnormality. LIVER: The liver is unremarkable. GALLBLADDER AND BILE DUCTS: Gallbladder is unremarkable. No biliary ductal dilatation. SPLEEN: No acute abnormality. PANCREAS: No acute abnormality. ADRENAL GLANDS: No acute abnormality. KIDNEYS, URETERS AND BLADDER: Bilateral Bosniak class 1 and 2 kidney cysts. The largest arises off the posterior cortex of the right kidney measuring 3.1 cm. No stones in the kidneys or ureters. No hydronephrosis. No perinephric or periureteral stranding. Urinary bladder is unremarkable. GI AND BOWEL: Multiple dilated loops of small bowel are noted measuring up to 4.2 cm in diameter. Transitioned to decreased caliber distal small bowel identified within the right hemi coronal image 85/6. Normal caliber terminal ileum. Moderate retained stool identified within the ascending colon. Sigmoid diverticulosis. Focally inflamed sigmoid diverticula noted within the left lower quadrant/3. PERITONEUM AND RETROPERITONEUM: Small volume of free fluid noted within the pelvis. No free air. VASCULATURE: Aortic atherosclerosis. LYMPH NODES: No signs of abdominopelvic adenopathy. REPRODUCTIVE ORGANS: Prostate gland enlargement with mass effect upon the base of bladder. BONES AND SOFT TISSUES: Degenerative changes  noted within the lumbar spine. No acute or suspicious bone abnormality. IMPRESSION: 1. Small bowel obstruction with transition point in the right hemiabdomen. 2. Uncomplicated acute sigmoid diverticulitis. No perforation or abscess. 3. Prostate gland enlargement with mass effect upon the base of bladder. 4. Aortic atherosclerotic calcification. Electronically signed by: Waddell Calk MD 07/31/2024 01:29 PM EDT RP Workstation: [id number]   DG Abd 1 View Result Date: 07/31/2024 EXAM: 1 VIEW XRAY OF THE ABDOMEN 07/31/2024 09:18:29 AM COMPARISON: None available. CLINICAL HISTORY: Acute abdominal pain, distension, nausea, vomiting. Pt st's he had nausea vomiting and diarrhea onset 5 days ago. Was seen by his doctor 4 days ago. Pt st's he started feeling better but nausea, bloating and abd pain returned yesterday. Pt st's after vomiting large amount last pm he felt better. Hypo-active bowel sounds in all 4 quads. FINDINGS: BOWEL: There are multiple dilated small bowel loops within the upper abdomen with air-fluid levels. These measure up to 5.1 cm in diameter. There is moderate retained stool in the right colon and rectum. SOFT TISSUES: No abnormal calcifications. No opaque urinary calculi. BONES: No acute osseous abnormality. IMPRESSION: 1. Multiple dilated small bowel loops with air-fluid levels, measuring up to 5.1 cm in diameter, consistent with small bowel obstruction. 2. Moderate retained stool in the right colon and rectum. 3. The urgent finding will be called to the ordering provider by the [facility] [employer]ist[employer] (PRAs) and documented in the [facility] dashboard. Electronically signed by: Waddell Calk MD 07/31/2024 09:30 AM EDT RP Workstation: [id number]       The results of significant diagnostics from this hospitalization (including imaging, microbiology, ancillary and laboratory) are listed below for reference.     Microbiology: Recent Results (from the past 240 hours)  Surgical pcr screen      Status: None   Collection Time: 08/05/24  4:35 AM   Specimen: Nasal Mucosa; Nasal Swab  Result Value Ref Range Status   MRSA, PCR NEGATIVE NEGATIVE Final   Staphylococcus aureus NEGATIVE NEGATIVE Final    Comment: (NOTE) The Xpert SA Assay (FDA approved for NASAL specimens in patients 38 years of age and older), is one component of a comprehensive surveillance program. It is not intended to diagnose infection nor to guide or monitor treatment. Performed at [facility],  [address] [place], [place] [address]      Labs:  CBC: Recent Labs  Lab 08/05/24 1213 08/06/24 0902 08/07/24 9385 08/08/24 0655 08/09/24 0327  WBC 12.4* 11.6* 9.2 9.8 8.9  HGB 14.9 14.1 13.4 13.4 13.2  HCT 47.1 43.0 40.4 40.7 40.4  MCV 92.7 88.3 88.0 87.2 87.1  PLT 216 271 229 241 230   BMP &GFR Recent Labs  Lab 08/05/24 1213 08/06/24 0902 08/07/24 0614 08/08/24 0655 08/09/24 0327  NA 141 136 136 134* 135  K 4.4 4.0 3.8 3.9 4.4  CL 106 102 105 103 99  CO2 19* 22 21* 21* 24  GLUCOSE 174* 124* 96 88 124*  BUN 12 12 8  7* 6*  CREATININE 0.99 0.99 0.84 0.87 0.88  CALCIUM  8.5* 8.5* 8.1* 8.1* 8.5*  MG 1.8 1.9 1.7 1.7 1.8  PHOS 3.6 2.9 2.9 3.1 3.7   Estimated Creatinine Clearance: 104.6 mL/min (by C-G formula based on SCr of 0.88 mg/dL). Liver & Pancreas: Recent Labs  Lab 08/05/24 1213 08/06/24 0902 08/07/24 9385 08/08/24 0655 08/09/24 0327  ALBUMIN 3.1* 2.8* 2.6* 2.5* 2.5*   No results for input(s): LIPASE, AMYLASE in the last 168 hours. No results for input(s): AMMONIA in the last 168 hours. Diabetic: No results for input(s): HGBA1C in the last 72 hours. Recent Labs  Lab 08/08/24 0617 08/08/24 1206 08/08/24 1812 08/09/24 0035 08/09/24 0511  GLUCAP 89 125* 172* 136* 127*   Cardiac Enzymes: No results for input(s): CKTOTAL, CKMB, CKMBINDEX, TROPONINI in the last 168 hours. No results for input(s): PROBNP in the last 8760 hours. Coagulation  Profile: No results for input(s): INR, PROTIME in the last 168 hours. Thyroid Function Tests: No results for input(s): TSH, T4TOTAL, FREET4, T3FREE, THYROIDAB in the last 72 hours. Lipid Profile: No results for input(s): CHOL, HDL, LDLCALC, TRIG, CHOLHDL, LDLDIRECT in the last 72 hours. Anemia Panel: No results for input(s): VITAMINB12, FOLATE, FERRITIN, TIBC, IRON, RETICCTPCT in the last 72 hours. Urine analysis:    Component Value Date/Time   COLORURINE AMBER (A) 07/31/2024 1052   APPEARANCEUR HAZY (A) 07/31/2024 1052   LABSPEC 1.035 (H) 07/31/2024 1052   PHURINE 5.0 07/31/2024 1052   GLUCOSEU >=500 (A) 07/31/2024 1052   HGBUR NEGATIVE 07/31/2024 1052   BILIRUBINUR SMALL (A) 07/31/2024 1052   BILIRUBINUR moderate (A) 07/31/2024 0907   KETONESUR 5 (A) 07/31/2024 1052   KETONESUR trace (5) (A) 07/31/2024 0907   PROTEINUR 100 (A) 07/31/2024 1052   UROBILINOGEN 1.0 07/31/2024 0907   NITRITE NEGATIVE 07/31/2024 1052   NITRITE Negative 07/31/2024 0907   LEUKOCYTESUR NEGATIVE 07/31/2024 1052   LEUKOCYTESUR Negative 07/31/2024 0907   Sepsis Labs: Invalid input(s): PROCALCITONIN, LACTICIDVEN   SIGNED:  Brunette Lavalle T Tyah Acord, MD  [facility] 08/09/2024, 3:18 PM

## 2024-08-09 NOTE — Progress Notes (Signed)
 4 Days Post-Op  Subjective: CC: NGT removed and diet advanced to FLD yesterday.  Tolerating FLD without abdominal pain, n/v. BM yesterday. Mobilizing. Voiding.   Afebrile. No tachycardia or hypotension. WBC wnl.   Objective: Vital signs in last 24 hours: Temp:  [97.7 F (36.5 C)-98.1 F (36.7 C)] 98.1 F (36.7 C) (08/26 0807) Pulse Rate:  [64-73] 64 (08/26 0807) Resp:  [16-18] 18 (08/26 0807) BP: (128-143)/(82-88) 130/82 (08/26 0807) SpO2:  [96 %-98 %] 97 % (08/26 0807) Last BM Date : 08/08/24  Intake/Output from previous day: 08/25 0701 - 08/26 0700 In: 300 [IV Piggyback:300] Out: -  Intake/Output this shift: No intake/output data recorded.  PE: Gen:  Alert, NAD, pleasant Abd: Soft, ND, NT today, +BS. Incisions with glue intact appears well and are without drainage, bleeding, or signs of infection.   Lab Results:  Recent Labs    08/08/24 0655 08/09/24 0327  WBC 9.8 8.9  HGB 13.4 13.2  HCT 40.7 40.4  PLT 241 230   BMET Recent Labs    08/08/24 0655 08/09/24 0327  NA 134* 135  K 3.9 4.4  CL 103 99  CO2 21* 24  GLUCOSE 88 124*  BUN 7* 6*  CREATININE 0.87 0.88  CALCIUM  8.1* 8.5*   PT/INR No results for input(s): LABPROT, INR in the last 72 hours. CMP     Component Value Date/Time   NA 135 08/09/2024 0327   K 4.4 08/09/2024 0327   CL 99 08/09/2024 0327   CO2 24 08/09/2024 0327   GLUCOSE 124 (H) 08/09/2024 0327   BUN 6 (L) 08/09/2024 0327   CREATININE 0.88 08/09/2024 0327   CALCIUM  8.5 (L) 08/09/2024 0327   PROT 5.9 (L) 08/01/2024 0448   ALBUMIN 2.5 (L) 08/09/2024 0327   AST 14 (L) 08/01/2024 0448   ALT 15 08/01/2024 0448   ALKPHOS 37 (L) 08/01/2024 0448   BILITOT 1.0 08/01/2024 0448   GFRNONAA >60 08/09/2024 0327   GFRAA 114 02/13/2009 0935   Lipase     Component Value Date/Time   LIPASE 41 07/31/2024 1059    Studies/Results: No results found.   Anti-infectives: Anti-infectives (From admission, onward)    Start      Dose/Rate Route Frequency Ordered Stop   08/02/24 1600  cefTRIAXone  (ROCEPHIN ) 2 g in sodium chloride  0.9 % 100 mL IVPB        2 g 200 mL/hr over 30 Minutes Intravenous Every 24 hours 08/02/24 0957     08/01/24 0300  ciprofloxacin  (CIPRO ) IVPB 400 mg  Status:  Discontinued        400 mg 200 mL/hr over 60 Minutes Intravenous Every 12 hours 07/31/24 1545 08/02/24 0957   08/01/24 0300  metroNIDAZOLE  (FLAGYL ) IVPB 500 mg        500 mg 100 mL/hr over 60 Minutes Intravenous Every 12 hours 07/31/24 1545     07/31/24 1400  ciprofloxacin  (CIPRO ) IVPB 400 mg        400 mg 200 mL/hr over 60 Minutes Intravenous  Once 07/31/24 1347 07/31/24 1620   07/31/24 1400  metroNIDAZOLE  (FLAGYL ) IVPB 500 mg        500 mg 100 mL/hr over 60 Minutes Intravenous  Once 07/31/24 1347 07/31/24 1547        Assessment/Plan POD 4 s/p diagnostic laparoscopy on 8/22 by Dr. Polly - Patient presented with short segment simple sigmoid diverticulitis and a small bowel obstruction in the setting of no prior abdominal surgeries. Patient failed diet advancement  with n/v requiring NGT and was taken to the OR on 8/22 for diagnostic laparoscopy - Intra-op findings: Mildly dilated small bowel without a clear transition point. There were a few flimsy adhesions in the RLQ. Signs of uncomplicated diverticulitis  - Adv to soft diet.  - Okay to stop abx for diverticulitis from our standpoint. Recommend follow up with GI for a colonoscopy in 6 weeks after discharge.  - If patient tolerates diet advancement, he is okay for discharge from our standpoint. Will arrange follow up in the office. He asks that I do not send any narcotic pain medications to his pharmacy at this time. Will provide a work note. Discussed discharge instructions, restrictions and return/call back precautions. Will send a message to [facility].   FEN - Soft, IVF per [facility] VTE - SCDs, Lovenox  ID - Rocephin /Flagyl  (okay to stop from our standpoint)  Foley - None, spont  void    LOS: 9 days    Brett [facility], [facility] 08/09/2024, 9:01 AM Please see Amion for pager number during day hours 7:00am-4:30pm

## 2024-08-09 NOTE — Plan of Care (Signed)
 Pt denies pain, nausea during shift.  BG within range, no insulin  needed.  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 No bowel movement during shift. Problem: Education: Goal: Ability to describe self-care measures that may prevent or decrease complications (Diabetes Survival Skills Education) will improve Outcome: Progressing Goal: Individualized Educational Video(s) Outcome: Progressing   Problem: Coping: Goal: Ability to adjust to condition or change in health will improve Outcome: Progressing   Problem: Fluid Volume: Goal: Ability to maintain a balanced intake and output will improve Outcome: Progressing   Problem: Health Behavior/Discharge Planning: Goal: Ability to identify and utilize available resources and services will improve Outcome: Progressing Goal: Ability to manage health-related needs will improve Outcome: Progressing   Problem: Metabolic: Goal: Ability to maintain appropriate glucose levels will improve Outcome: Progressing   Problem: Nutritional: Goal: Maintenance of adequate nutrition will improve Outcome: Progressing Goal: Progress toward achieving an optimal weight will improve Outcome: Progressing   Problem: Skin Integrity: Goal: Risk for impaired skin integrity will decrease Outcome: Progressing   Problem: Tissue Perfusion: Goal: Adequacy of tissue perfusion will improve Outcome: Progressing   Problem: Education: Goal: Knowledge of General Education information will improve Description: Including pain rating scale, medication(s)/side effects and non-pharmacologic comfort measures Outcome: Progressing   Problem: Health Behavior/Discharge Planning: Goal: Ability to manage health-related needs will improve Outcome: Progressing   Problem: Clinical Measurements: Goal: Ability to maintain clinical measurements within normal limits will improve Outcome: Progressing Goal: Will remain free from  infection Outcome: Progressing Goal: Diagnostic test results will improve Outcome: Progressing Goal: Respiratory complications will improve Outcome: Progressing Goal: Cardiovascular complication will be avoided Outcome: Progressing   Problem: Activity: Goal: Risk for activity intolerance will decrease Outcome: Progressing   Problem: Nutrition: Goal: Adequate nutrition will be maintained Outcome: Progressing   Problem: Coping: Goal: Level of anxiety will decrease Outcome: Progressing   Problem: Elimination: Goal: Will not experience complications related to bowel motility Outcome: Progressing Goal: Will not experience complications related to urinary retention Outcome: Progressing   Problem: Pain Managment: Goal: General experience of comfort will improve and/or be controlled Outcome: Progressing   Problem: Safety: Goal: Ability to remain free from injury will improve Outcome: Progressing   Problem: Skin Integrity: Goal: Risk for impaired skin integrity will decrease Outcome: Progressing

## 2024-08-09 NOTE — Plan of Care (Signed)
 Patient tolerated solid foods, no complaints of pain, n/v. Patient verbalized that he is safe to go home. AVS printed out and instructed with some questions asked and answered. PIV removed. Problem: Education: Goal: Ability to describe self-care measures that may prevent or decrease complications (Diabetes Survival Skills Education) will improve Outcome: Adequate for Discharge Goal: Individualized Educational Video(s) Outcome: Adequate for Discharge   Problem: Coping: Goal: Ability to adjust to condition or change in health will improve Outcome: Adequate for Discharge   Problem: Fluid Volume: Goal: Ability to maintain a balanced intake and output will improve Outcome: Adequate for Discharge   Problem: Health Behavior/Discharge Planning: Goal: Ability to identify and utilize available resources and services will improve Outcome: Adequate for Discharge Goal: Ability to manage health-related needs will improve Outcome: Adequate for Discharge   Problem: Metabolic: Goal: Ability to maintain appropriate glucose levels will improve Outcome: Adequate for Discharge   Problem: Nutritional: Goal: Maintenance of adequate nutrition will improve Outcome: Adequate for Discharge Goal: Progress toward achieving an optimal weight will improve Outcome: Adequate for Discharge   Problem: Skin Integrity: Goal: Risk for impaired skin integrity will decrease Outcome: Adequate for Discharge   Problem: Tissue Perfusion: Goal: Adequacy of tissue perfusion will improve Outcome: Adequate for Discharge   Problem: Education: Goal: Knowledge of General Education information will improve Description: Including pain rating scale, medication(s)/side effects and non-pharmacologic comfort measures Outcome: Adequate for Discharge   Problem: Health Behavior/Discharge Planning: Goal: Ability to manage health-related needs will improve Outcome: Adequate for Discharge   Problem: Clinical Measurements: Goal:  Ability to maintain clinical measurements within normal limits will improve Outcome: Adequate for Discharge Goal: Will remain free from infection Outcome: Adequate for Discharge Goal: Diagnostic test results will improve Outcome: Adequate for Discharge Goal: Respiratory complications will improve Outcome: Adequate for Discharge Goal: Cardiovascular complication will be avoided Outcome: Adequate for Discharge   Problem: Activity: Goal: Risk for activity intolerance will decrease Outcome: Adequate for Discharge   Problem: Nutrition: Goal: Adequate nutrition will be maintained Outcome: Adequate for Discharge   Problem: Coping: Goal: Level of anxiety will decrease Outcome: Adequate for Discharge   Problem: Elimination: Goal: Will not experience complications related to bowel motility Outcome: Adequate for Discharge Goal: Will not experience complications related to urinary retention Outcome: Adequate for Discharge   Problem: Pain Managment: Goal: General experience of comfort will improve and/or be controlled Outcome: Adequate for Discharge   Problem: Safety: Goal: Ability to remain free from injury will improve Outcome: Adequate for Discharge   Problem: Skin Integrity: Goal: Risk for impaired skin integrity will decrease Outcome: Adequate for Discharge

## 2024-08-10 ENCOUNTER — Telehealth: Payer: Self-pay

## 2024-08-10 ENCOUNTER — Ambulatory Visit
Admission: EM | Admit: 2024-08-10 | Discharge: 2024-08-10 | Disposition: A | Discharge status: Home / Self Care | Attending: Physician Assistant | Admitting: Physician Assistant

## 2024-08-10 ENCOUNTER — Encounter: Payer: Self-pay | Admitting: Emergency Medicine

## 2024-08-10 DIAGNOSIS — R350 Frequency of micturition: Secondary | ICD-10-CM | POA: Insufficient documentation

## 2024-08-10 LAB — POCT URINE DIPSTICK
Glucose, UA: 500 mg/dL — AB
Leukocytes, UA: NEGATIVE
Nitrite, UA: NEGATIVE
POC PROTEIN,UA: 30 — AB
Spec Grav, UA: 1.025 (ref 1.010–1.025)
Urobilinogen, UA: 0.2 U/dL
pH, UA: 5.5 (ref 5.0–8.0)

## 2024-08-10 NOTE — Telephone Encounter (Signed)
 Noted. Thanks.  I am not in clinic today to see patient.

## 2024-08-10 NOTE — Telephone Encounter (Unsigned)
 Copied from CRM [id number]. Topic: Appointments - Scheduling Inquiry for Clinic >> Aug 10, 2024  8:30 AM Robinson H wrote: Reason for CRM: Patient was discharged from hospital yesterday and now has a change in urine stream after catheter, no pain or burning. Patient has an appointment with provider on 8/29 but states he needs to be seen for the new  issue before Friday  [facility]

## 2024-08-10 NOTE — ED Triage Notes (Signed)
 Pt presents c/o frequent urination and urine stream concerns. Pt reports he possibly had a catheter during surgery and initially thought that is what caused the burning sensation when urinating that onset on 08/05/24 and lasted until 08/07/24. Pt states the burning has stopped and now when he goes to urinate his stream is not as steady as it usually is. He reports it sorta sprays a little when he starts urinating and then falls into steady stream. Pt says he had Prostitis previously and this feels the same.

## 2024-08-10 NOTE — Telephone Encounter (Signed)
 I spoke with pt; pt has already been seen at [facility] today and  will keep appt with Dr Cleatus  on 08/12/24 at 8AM. Nothing further needed at this time. Sending note to Dr Ricarda can .

## 2024-08-10 NOTE — Transitions of Care (Post Inpatient/ED Visit) (Signed)
 08/10/2024  Name: Brett [facility] MRN: [id number] DOB: [birth year]  Today's TOC FU Call Status: Today's TOC FU Call Status:: Successful TOC FU Call Completed TOC FU Call Complete Date: 08/10/24 Patient's Name and Date of Birth confirmed.  Transition Care Management Follow-up Telephone Call Date of Discharge: 08/09/24 Discharge Facility: [facility]) Type of Discharge: Inpatient Admission Primary Inpatient Discharge Diagnosis:: diverticulitis How have you been since you were released from the [facility]?: Better (patient states he feels good however he's had some irritation when he urinates.) Any questions or concerns?: Yes Patient Questions/Concerns:: patient states he feels good however he's had some irritation when he urinates.  Patient states he's had prostatitis before so this concerns him. He states he tried to get in to see his primary doctor however they didn'Stanley have any openings so now he is sitting at the urgent care center waitng to be called. Patient Questions/Concerns Addressed: Other: (patients concerns discussed. Patient currently at the urgent care waiting to be seen.)  Items Reviewed: Did you receive and understand the discharge instructions provided?: Yes Medications obtained,verified, and reconciled?: Yes (Medications Reviewed) Any new allergies since your discharge?: No Dietary orders reviewed?: Yes Type of Diet Ordered:: SOFT LOW FIBER DIET Do you have support at home?: Yes People in Home [RPT]: spouse Name of Support/Comfort Primary Source: Brett [facility]  Medications Reviewed Today: Medications Reviewed Today     Reviewed by Brett Faulk E, RN (Registered Nurse) on 08/10/24 at [phone]  Med List Status: <None>   Medication Order Taking? Sig Documenting Provider Last Dose Status Informant  fexofenadine (ALLEGRA) 180 MG tablet [id number] Yes Take 180 mg by mouth daily. [provider]  Active Self  fluticasone (FLONASE) 50 MCG/ACT nasal spray [id number] Yes  Place 1 spray into both nostrils daily.  Patient taking differently: Place 1 spray into both nostrils daily. Patient states takes as needed   [provider]  Active Self  glipiZIDE  (GLUCOTROL ) 5 MG tablet [id number] Yes TAKE 2 TABLETS BY MOUTH DAILY BEFORE BREAKFAST Brett Brett RAMAN, MD  Active Self  glucose blood [facility] VERIO) test strip [id number] Yes Check sugar 1 time daily. E11.9 Brett Brett RAMAN, MD  Active Self  lisinopril  (ZESTRIL ) 5 MG tablet [id number] Yes TAKE 1 TABLET BY MOUTH DAILY Brett Brett RAMAN, MD  Active Self  metFORMIN  (GLUCOPHAGE ) 500 MG tablet [id number] Yes TAKE 1 TABLET BY MOUTH DAILY WITH BREAKFAST Brett Brett RAMAN, MD  Active Self  polyethylene glycol powder (MIRALAX ) 17 GM/SCOOP powder [id number] Yes Take 17 g by mouth 2 (two) times daily as needed for moderate constipation or mild constipation. Gonfa, Taye T, MD  Active   Semaglutide , 1 MG/DOSE, 4 MG/3ML SOPN [id number]  Inject 1 mg as directed once a week. Brett Brett RAMAN, MD  Active Self  sildenafil  (REVATIO ) 20 MG tablet [id number] Yes TAKE 5 TABLETS BY MOUTH DAILY AS NEEDED Brett Brett RAMAN, MD  Active Self  triamcinolone  cream (KENALOG ) 0.5 % [id number] Yes Apply 1 Application topically 2 (two) times daily as needed. Brett Brett RAMAN, MD  Active Self            Home Care and Equipment/Supplies: Were Home Health Services Ordered?: No Any new equipment or medical supplies ordered?: No  Functional Questionnaire: Do you need assistance with bathing/showering or dressing?: Yes Do you need assistance with meal preparation?: No Do you need assistance with eating?: No Do you have difficulty maintaining continence: No Do you need assistance with getting out of bed/getting out of a  chair/moving?: No Do you have difficulty managing or taking your medications?: No  Follow up appointments reviewed: PCP Follow-up appointment confirmed?: Yes Date of PCP follow-up appointment?: 08/12/24 Follow-up Provider: Dr.  Arlyss [facility] Specialist [facility] Follow-up appointment confirmed?: Yes Date of Specialist follow-up appointment?: 09/01/24 Follow-Up Specialty Provider:: Dr. Puja Gosal Maczis Do you need transportation to your follow-up appointment?: No Do you understand care options if your condition(s) worsen?: Yes-patient verbalized understanding  Discussed and offered 30 day TOC program.  Patient declined.  The patient has been provided with contact information for the care management team and has been advised to call with any health -related questions or concerns.  The patient verbalized understanding with current plan of care.  The patient is directed to their insurance card regarding availability of benefits coverage.    Brett Seip RN, BSN, CCM [employer], Population Health Case Manager Phone: [phone]

## 2024-08-10 NOTE — Patient Instructions (Signed)
 Visit Information  Thank you for taking time to visit with me today. Please don't hesitate to contact me if I can be of assistance to you   Patient instructions:  Take medications as prescribed Keep follow up visits with providers Notify providers for any new/ ongoing symptoms Notify providers for increase in pain unrelieved by  current pain medications You should follow  light diet the first few days after arriving home. Be sure to include lots of fluid daily. Avoid fatty fried foods. Notify provider for unrelieved constipation Monitor incision sites for signs of infection. Notify provider if infection symptoms are noted.    Patient verbalizes understanding of instructions and care plan provided today and agrees to view in MyChart. Active MyChart status and patient understanding of how to access instructions and care plan via MyChart confirmed with patient.     The patient has been provided with contact information for the care management team and has been advised to call with any health related questions or concerns.   Please call the care guide team at [phone] if you need to cancel or reschedule your appointment.   Please call the Suicide and Crisis Lifeline: 988 call the [place]  National Suicide Prevention Lifeline: (509) 424-5471 or TTY: 947 314 1581 TTY 812-844-5515) to talk to a trained counselor call 1-800-273-TALK (toll free, 24 hour hotline) go to [facility] [address] [place] [phone]) if you are experiencing a Mental Health or Behavioral Health Crisis or need someone to talk to.  Arvin Seip RN, BSN, CCM [employer], Population Health Case Manager Phone: [phone]

## 2024-08-11 ENCOUNTER — Ambulatory Visit: Payer: Self-pay

## 2024-08-11 LAB — URINE CULTURE: Culture: NO GROWTH

## 2024-08-11 NOTE — Telephone Encounter (Signed)
 Pt wife calling stating that patient is still having some vomiting after being discharged from the hospital for diverticulitis and an illus.  States pt has no abd pain once he vomits and has vomited once last night and once this morning. Pt wife is concerned about his diet and what he should or should not eat.   Pt has appt on tomorrow. Please advise.

## 2024-08-11 NOTE — ED Provider Notes (Signed)
 [facility]    CSN: [id number] Arrival date & time: 08/10/24  0912      History   Chief Complaint Chief Complaint  Patient presents with   Possible UTI    HPI Brett Stanley is a 68 y.o. male.   Patient here today for evaluation of frequent urination that started after he was discharged from the hospital.  He states that he is not sure if he had a catheter during his recent surgery.  He notes that dysuria has stopped but his urine stream seems less steady than it normally is.  He has had this occur in the past with UTI and given recent hospitalization he just wants to make sure he does not have an infection.  He has not had fever.  The history is provided by the patient.    Past Medical History:  Diagnosis Date   Allergy    GERD (gastroesophageal reflux disease)    HTN (hypertension)    Hyperlipidemia    Sleep apnea    improved, off cpap as of 2024.   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   Dysphagia 08/25/2018   Hearing loss 08/25/2018   Tick bite 09/02/2017   GERD (gastroesophageal reflux disease) 08/07/2017   Advance care planning 02/26/2015   Knee pain 03/07/2013   Welcome to Medicare preventive visit 11/16/2012   Essential hypertension 02/19/2009   Controlled type 2 diabetes mellitus without complication, without long-term current use of insulin  (HCC) 06/22/2007   HYPERCHOLESTEROLEMIA 06/15/2007    Allergic rhinitis 06/15/2007    Past Surgical History:  Procedure Laterality Date   COLONOSCOPY     2015   KNEE ARTHROSCOPY Right 2014   LAPAROSCOPY N/A 08/05/2024   Procedure: LAPAROSCOPY, DIAGNOSTIC;  Surgeon: Polly Cordella LABOR, MD;  Location: [facility] OR;  Service: General;  Laterality: N/A;   VASECTOMY  1997   Dr. Amiel       Home Medications    Prior to Admission medications   Medication Sig Start Date End Date Taking? Authorizing Provider  fexofenadine (ALLEGRA) 180 MG tablet Take 180 mg by mouth daily.   Yes [provider]  glipiZIDE  (GLUCOTROL ) 5 MG tablet TAKE 2 TABLETS BY MOUTH DAILY BEFORE BREAKFAST 03/04/24  Yes [name], MD  lisinopril  (ZESTRIL ) 5 MG tablet TAKE 1 TABLET BY MOUTH DAILY 03/04/24  Yes [name], MD  metFORMIN  (GLUCOPHAGE ) 500 MG tablet TAKE 1 TABLET BY MOUTH DAILY WITH BREAKFAST 03/04/24  Yes [name], MD  fluticasone [facility]) 50 MCG/ACT nasal spray Place 1 spray into both nostrils daily. Patient taking differently: Place 1 spray into both nostrils daily. Patient states takes as needed    [provider]  glucose blood (ONETOUCH VERIO) test strip Check sugar 1 time daily. E11.9 10/20/22   [name], MD  polyethylene glycol powder (MIRALAX ) 17 GM/SCOOP powder Take 17 g by mouth 2 (two) times daily as needed  for moderate constipation or mild constipation. 08/09/24   Gonfa, Taye T, MD  Semaglutide , 1 MG/DOSE, 4 MG/3ML SOPN Inject 1 mg as directed once a week. 04/18/24   [name], MD  sildenafil  (REVATIO ) 20 MG tablet TAKE 5 TABLETS BY MOUTH DAILY AS NEEDED 05/25/24   [name], MD  triamcinolone  cream (KENALOG ) 0.5 % Apply 1 Application topically 2 (two) times daily as needed. 07/15/22   [name], MD    Family History Family History  Problem Relation Age of Onset   Alzheimer's disease Mother    Diabetes Mother    Hypertension Other    Cancer Other        Leukemia   Sleep apnea Sister     Diabetes Maternal Uncle    Depression Maternal Uncle    Stroke Maternal Grandfather    Colon cancer Neg Hx    Prostate cancer Neg Hx    Colon polyps Neg Hx    Esophageal cancer Neg Hx    Rectal cancer Neg Hx    Stomach cancer Neg Hx     Social History Social History   Tobacco Use   Smoking status: Never    Passive exposure: Past   Smokeless tobacco: Former    Types: Engineer, drilling   Vaping status: Never Used  Substance Use Topics   Alcohol use: Yes    Alcohol/week: 0.0 standard drinks of alcohol    Comment: very rare use   Drug use: No     Allergies   Atorvastatin , Erythromycin, Metformin  and related, and Oxycodone   Review of Systems Review of Systems  Constitutional:  Negative for chills and fever.  Eyes:  Negative for discharge and redness.  Respiratory:  Negative for shortness of breath.   Genitourinary:  Positive for frequency. Negative for dysuria.  Neurological:  Negative for numbness.     Physical Exam Triage Vital Signs ED Triage Vitals  Encounter Vitals Group     BP 08/10/24 1001 123/80     Girls Systolic BP Percentile --      Girls Diastolic BP Percentile --      Boys Systolic BP Percentile --      Boys Diastolic BP Percentile --      Pulse Rate 08/10/24 1001 90     Resp 08/10/24 1001 18     Temp 08/10/24 1001 98.2 F (36.8 C)     Temp Source 08/10/24 1001 Oral     SpO2 08/10/24 1001 96 %     Weight 08/10/24 0958 259 lb 14.8 oz (117.9 kg)     Height --      Head Circumference --      Peak Flow --      Pain Score 08/10/24 0958 0     Pain Loc --      Pain Education --      Exclude from Growth Chart --    No data found.  Updated Vital Signs BP 123/80 (BP Location: Left Arm)   Pulse 90   Temp 98.2 F (36.8 C) (Oral)   Resp 18   Wt 259 lb 14.8 oz (117.9 kg)   SpO2 96%   BMI 38.38 kg/m   Visual Acuity Right Eye Distance:   Left Eye Distance:   Bilateral Distance:    Right Eye Near:   Left Eye Near:    Bilateral Near:      Physical Exam Vitals and nursing note reviewed.  Constitutional:  General: He is not in acute distress.    Appearance: Normal appearance. He is not ill-appearing.  HENT:     Head: Normocephalic and atraumatic.  Eyes:     Conjunctiva/sclera: Conjunctivae normal.  Cardiovascular:     Rate and Rhythm: Normal rate.  Pulmonary:     Effort: Pulmonary effort is normal. No respiratory distress.  Neurological:     Mental Status: He is alert.  Psychiatric:        Mood and Affect: Mood normal.        Behavior: Behavior normal.        Thought Content: Thought content normal.      UC Treatments / Results  Labs (all labs ordered are listed, but only abnormal results are displayed) Labs Reviewed  POCT URINE DIPSTICK - Abnormal; Notable for the following components:      Result Value   Clarity, UA hazy (*)    Glucose, UA =500 (*)    Bilirubin, UA small (*)    Ketones, POC UA small (15) (*)    Blood, UA trace-intact (*)    POC PROTEIN,UA =30 (*)    All other components within normal limits  URINE CULTURE    EKG   Radiology No results found.  Procedures Procedures (including critical care time)  Medications Ordered in UC Medications - No data to display  Initial Impression / Assessment and Plan / UC Course  I have reviewed the triage vital signs and the nursing notes.  Pertinent labs & imaging results that were available during my care of the patient were reviewed by me and considered in my medical decision making (see chart for details).    UA without signs of UTI.  Reassured patient of same.  Recommended increased fluids and rest with follow-up if no gradual improvement or with any further concerns.  Final Clinical Impressions(s) / UC Diagnoses   Final diagnoses:  Urinary frequency   Discharge Instructions   None    ED Prescriptions   None    PDMP not reviewed this encounter.   Billy Asberry FALCON, PA-C 08/11/24 1344

## 2024-08-11 NOTE — Telephone Encounter (Signed)
 Called patients wife and reviewed all information. she verbalized understanding. Will call if any further questions.

## 2024-08-11 NOTE — Telephone Encounter (Signed)
 FYI Only or Action Required?: Action required by provider: clinical question for provider and update on patient condition.  Patient was last seen in primary care on 07/27/2024 by Randeen Laine LABOR, MD.  Called Nurse Triage reporting Vomiting.  Symptoms began several weeks ago.  Interventions attempted: Rest, hydration, or home remedies.  Symptoms are: unchanged.  Triage Disposition: See Physician Within 24 Hours  Patient/caregiver understands and will follow disposition?: No, wishes to speak with PCP      Copied from CRM #[id number]. Topic: Clinical - Red Word Triage >> Aug 11, 2024  8:16 AM Franky GRADE wrote: Red Word that prompted transfer to Nurse Triage: Patient was discharged from the hospital on 08/09/2024 and diagnosed with diverticulitis, He was told to start a low fiber diet but per patient's wife he has not gotten any better and last night started vomiting. Reason for Disposition  [1] MILD pain (e.g., does not interfere with normal activities) AND [2] pain comes and goes (cramps) [3] present > 48 hours  (Exception: This same abdominal pain is a chronic symptom recurrent or ongoing AND present > 4 weeks.)  Answer Assessment - Initial Assessment Questions 1. LOCATION: Where does it hurt?      Lower abd 2. RADIATION: Does the pain shoot anywhere else? (e.g., chest, back)     no 3. ONSET: When did the pain begin? (Minutes, hours or days ago)      Ongoing  4. SUDDEN: Gradual or sudden onset?     gradual 5. PATTERN Does the pain come and go, or is it constant?     Comes and goes 6. SEVERITY: How bad is the pain?  (e.g., Scale 1-10; mild, moderate, or severe)     No pain currently 7. RECURRENT SYMPTOM: Have you ever had this type of stomach pain before? If Yes, ask: When was the last time? and What happened that time?      Yes a couple weeks ago  8. CAUSE: What do you think is causing the stomach pain? (e.g., gallstones, recent abdominal surgery)      obstruction 9. RELIEVING/AGGRAVATING FACTORS: What makes it better or worse? (e.g., antacids, bending or twisting motion, bowel movement)     nothing 10. OTHER SYMPTOMS: Do you have any other symptoms? (e.g., back pain, diarrhea, fever, urination pain, vomiting)       Vomiting, diarrhea  Protocols used: Abdominal Pain - Male-A-AH

## 2024-08-11 NOTE — Telephone Encounter (Signed)
 Would try small portions of clear liquids in the meantime and see if he can tolerate that.  Thanks.

## 2024-08-12 ENCOUNTER — Ambulatory Visit (HOSPITAL_COMMUNITY): Payer: Self-pay

## 2024-08-12 ENCOUNTER — Ambulatory Visit: Admitting: Family Medicine

## 2024-08-12 VITALS — BP 110/60 | HR 78 | Temp 97.8°F | Ht 69.0 in | Wt 257.4 lb

## 2024-08-12 DIAGNOSIS — K56609 Unspecified intestinal obstruction, unspecified as to partial versus complete obstruction: Secondary | ICD-10-CM | POA: Diagnosis not present

## 2024-08-12 DIAGNOSIS — K5792 Diverticulitis of intestine, part unspecified, without perforation or abscess without bleeding: Secondary | ICD-10-CM

## 2024-08-12 DIAGNOSIS — K5732 Diverticulitis of large intestine without perforation or abscess without bleeding: Secondary | ICD-10-CM

## 2024-08-12 DIAGNOSIS — E119 Type 2 diabetes mellitus without complications: Secondary | ICD-10-CM

## 2024-08-12 LAB — COMPREHENSIVE METABOLIC PANEL WITH GFR
ALT: 24 U/L (ref 0–53)
AST: 24 U/L (ref 0–37)
Albumin: 3.7 g/dL (ref 3.5–5.2)
Alkaline Phosphatase: 39 U/L (ref 39–117)
BUN: 11 mg/dL (ref 6–23)
CO2: 25 meq/L (ref 19–32)
Calcium: 8.8 mg/dL (ref 8.4–10.5)
Chloride: 99 meq/L (ref 96–112)
Creatinine, Ser: 0.84 mg/dL (ref 0.40–1.50)
GFR: 90.63 mL/min (ref >60.00)
Glucose, Bld: 151 mg/dL — ABNORMAL HIGH (ref 70–99)
Potassium: 3.9 meq/L (ref 3.5–5.1)
Sodium: 137 meq/L (ref 135–145)
Total Bilirubin: 0.7 mg/dL (ref 0.2–1.2)
Total Protein: 6.4 g/dL (ref 6.0–8.3)

## 2024-08-12 LAB — CBC WITH DIFFERENTIAL/PLATELET
Basophils Absolute: 0 K/uL (ref 0.0–0.1)
Basophils Relative: 0.3 % (ref 0.0–3.0)
Eosinophils Absolute: 0.1 K/uL (ref 0.0–0.7)
Eosinophils Relative: 1.1 % (ref 0.0–5.0)
HCT: 43.5 % (ref 39.0–52.0)
Hemoglobin: 14.3 g/dL (ref 13.0–17.0)
Lymphocytes Relative: 14.6 % (ref 12.0–46.0)
Lymphs Abs: 1.5 K/uL (ref 0.7–4.0)
MCHC: 32.8 g/dL (ref 30.0–36.0)
MCV: 87.2 fl (ref 78.0–100.0)
Monocytes Absolute: 0.9 K/uL (ref 0.1–1.0)
Monocytes Relative: 9.1 % (ref 3.0–12.0)
Neutro Abs: 7.7 K/uL (ref 1.4–7.7)
Neutrophils Relative %: 74.9 % (ref 43.0–77.0)
Platelets: 254 K/uL (ref 150.0–400.0)
RBC: 4.99 Mil/uL (ref 4.22–5.81)
RDW: 13.6 % (ref 11.5–15.5)
WBC: 10.3 K/uL (ref 4.0–10.5)

## 2024-08-12 MED ORDER — OMEPRAZOLE 20 MG PO CPDR: 20.0000 mg | DELAYED_RELEASE_CAPSULE | Freq: Every day | ORAL | Status: AC

## 2024-08-12 NOTE — Progress Notes (Signed)
 Recent inpatient course discussed with patient.   Hospital course 68 year old M with PMH of OSA not on CPAP, DM-2, HTN and morbid obesity presenting with abdominal pain and distention for about 5 days, and admitted with small bowel obstruction and sigmoid diverticulitis.  Recently diagnosed with viral gastroenteritis after he presented there with abdominal discomfort, diarrhea and vomiting.  No prior abdominal surgery.  Colonoscopy in 2021 with two 8 to 10 mm polyps.  CT abdomen and pelvis showed SBO with transition point in right hemiabdomen and uncomplicated acute sigmoid diverticulitis without perforation.  Patient was started on IV antibiotics.  Surgery consulted and following.  Patient had a setback after initial improvement and required NG tube placement on 8/21.   After he failed conservative intervention, he underwent diagnostic laparoscopy on 8/22 that showed mildly dilated small bowel without a clear transition point but few flimsy adhesions in RLQ suggesting ileus.  Patient was treated with NG tube decompression and eventually improved.   On the day of discharge, he tolerated soft diet and cleared for discharge by general surgery.  Completed 10 days of antibiotic course in-house for acute uncomplicated diverticulitis.  Outpatient follow-ups as above.  Advised to hold Ozempic  until he talks to his prescriber.    See individual problem list below for more.    Problems addressed during this hospitalization Ileus versus small bowel obstruction: Presents with abdominal pain and distention preceded by nausea, vomiting and diarrhea earlier in the week.  CT showed SBO with transition point in right hemiabdomen.  Had setback after initial improvement requiring NG tube placement.  Failed conservative measures and underwent diagnostic laparoscopy on 8/22 that did not show clear SBO.  Eventually improved and tolerated soft diet.  He was cleared for discharge by general surgery for outpatient follow-up.   Patient to continue soft low fiber residue diet for now and increase fiber intake slowly.  Needs colonoscopy in about 6 weeks.  He has upcoming GI appointment.   Acute sigmoid diverticulitis: Nontender.  Tolerated soft diet. - Completed antibiotic course for 10 days in house.   NIDDM-2 with hyperglycemia: A1c 7.9% in 03/2024.  On metformin , glipizide  and Ozempic . -Continue home metformin  and glipizide . -Advised to hold his Ozempic  in the setting of ileus until he talks to his PCP   Essential hypertension: Normotensive -Continue holding home lisinopril    Hyponatremia: Resolved.   Leukocytosis: Likely due to diverticulitis.  Resolved  ==================== He had abd pain and vomiting and diarrhea.  Progressive sx and then to ER.    CT with  IMPRESSION: 1. Small bowel obstruction with transition point in the right hemiabdomen. 2. Uncomplicated acute sigmoid diverticulitis. No perforation or abscess. 3. Prostate gland enlargement with mass effect upon the base of bladder. 4. Aortic atherosclerotic calcification.  Last vomited yesterday AM at [facility].  He feels better today.  Had BMs yesterday.  No fevers.  No abd pain now.    He has surgery f/u pending.  Refer to GI, placed today.   Discussed that he had diverticulitis and small bowel obstruction versus ileus that did not respond to NG tube placement and required lysis of adhesions.  Meds, vitals, and allergies reviewed.   ROS: Per HPI unless specifically indicated in ROS section   GEN: nad, alert and oriented HEENT: mucous membranes moist NECK: supple w/o LA CV: rrr PULM: ctab, no inc wob ABD: soft, +bs EXT: no edema SKIN: no acute rash, with healing port sites on the abdomen without erythema.  41 minutes were devoted to  patient care in this encounter (this includes time spent reviewing the patient's file/history, interviewing and examining the patient, counseling/reviewing plan with patient).

## 2024-08-12 NOTE — Patient Instructions (Addendum)
 Gradually advance your diet as tolerated.  Fruits and vegetables last.   Go to the lab on the way out.   If you have mychart we'll likely use that to update you.     Stay off ozemipic.   Update me about your sugar next week.  We can make plans at that point.  Okay to take prilosec if needed for heartburn.   Refer to GI.   Take care.  Glad to see you.

## 2024-08-15 ENCOUNTER — Ambulatory Visit: Payer: Self-pay | Admitting: Family Medicine

## 2024-08-15 ENCOUNTER — Telehealth: Payer: Self-pay | Admitting: Family Medicine

## 2024-08-15 NOTE — Assessment & Plan Note (Signed)
 Clearly improved in the meantime.  Port sites are healing.  Hospital course discussed with patient. Would stay off ozemipic.   Okay to take prilosec if needed for heartburn.   Refer to GI.

## 2024-08-15 NOTE — Assessment & Plan Note (Signed)
 Would stay off ozemipic.   I asked him to update me about his sugar next week.  We can make plans at that point.  He may be a candidate for daily long-acting insulin .

## 2024-08-15 NOTE — Assessment & Plan Note (Signed)
 History of, clearly better.  Done with antibiotics.  Discussed gradually advancing his diet as tolerated.  Benign abdominal exam.

## 2024-08-15 NOTE — Telephone Encounter (Signed)
 Please check with patient.  He was asking about the need for potential pulmonary follow-up regarding sleep apnea.    If he is still using his mouthpiece and doing well, then it would likely be reasonable to continue as is.    If he is not doing well then I think it makes sense to follow-up with pulmonary.    If he wants to follow-up with pulmonary to reassess his status with sleep apnea, then I think it makes sense to follow-up with pulmonary.  Thanks.

## 2024-08-16 NOTE — Telephone Encounter (Signed)
 Spoke with patient, states he never used any of the equipment, he felt like he did not need it.  Patient is going to call pulmonary to follow up with them.

## 2024-08-17 ENCOUNTER — Encounter: Payer: Self-pay | Admitting: Nurse Practitioner

## 2024-08-17 NOTE — Telephone Encounter (Signed)
 Noted. Thanks.

## 2024-08-23 ENCOUNTER — Encounter: Payer: Self-pay | Admitting: Nurse Practitioner

## 2024-08-23 ENCOUNTER — Ambulatory Visit: Admitting: Nurse Practitioner

## 2024-08-23 VITALS — BP 112/66 | HR 76 | Ht 69.0 in | Wt 262.2 lb

## 2024-08-23 DIAGNOSIS — Z860101 Personal history of adenomatous and serrated colon polyps: Secondary | ICD-10-CM | POA: Diagnosis not present

## 2024-08-23 DIAGNOSIS — Z860100 Personal history of colon polyps, unspecified: Secondary | ICD-10-CM

## 2024-08-23 DIAGNOSIS — K56699 Other intestinal obstruction unspecified as to partial versus complete obstruction: Secondary | ICD-10-CM | POA: Diagnosis not present

## 2024-08-23 DIAGNOSIS — K5732 Diverticulitis of large intestine without perforation or abscess without bleeding: Secondary | ICD-10-CM

## 2024-08-23 MED ORDER — NA SULFATE-K SULFATE-MG SULF 17.5-3.13-1.6 GM/177ML PO SOLN: 1.0000 | Freq: Once | ORAL | 0 refills | Status: AC

## 2024-08-23 NOTE — Progress Notes (Unsigned)
 08/23/2024 Brett Stanley [id number] [birth year]   CHIEF COMPLAINT: Discuss scheduling a colonoscopy   HISTORY OF PRESENT ILLNESS: Brett Stanley is a 68 year old male with a past medical history of hypertension, DM type II, obesity, sleep apnea (abated after he lost weight), pneumonia and colon polyps.   He was admitted to the hospital 817/2025 due to having N/V and abdominal pain with distension for a few days. CTAP showed a SBO with transition point in right hemiabdomen and uncomplicated acute sigmoid diverticulitis without perforation. Patient was started on IV antibiotics, NGT was placed and general surgery was consulted. He failed conservative treatment and underwent a diagnostic laparoscopy 08/05/2024 8/22 that showed mildly dilated small bowel without a clear transition point but few flimsy adhesions in RLQ suggesting ileus.  Patient was treated with NG tube decompression and eventually improved. He completed 10 days of antibiotic for acute uncomplicated diverticulitis. His clinical status improved and his diet was advanced. He was discharged home 08/09/2024. Advised to hold Ozempic . A colonoscopy was recommended in 6 weeks.   He presents today to discuss scheduling a colonoscopy. He is accompanied by his wife. He denies having any nausea, vomiting or abdominal pain since he was discharged home. He describes having brief abdominal bloat after eating. He is eating 4 smaller meals daily. No bloody or black stools. Stools were a greenish for a few days which abated. He has infrequent GERD symptoms, occasionally takes Omeprazole  as needed. He stopped taking Ozempic . No ASA. He infrequently takes Advil for aches and pains. His most recent colonoscopy was 07/02/2020 which showed left sided diverticulosis and two 8 to 10 mm tubular adenomatous polyps were removed from the colon. A repeat colonoscopy was recommended in 3 years which was not done.       Latest Ref Rng & Units 08/12/2024    8:57 AM  08/09/2024    3:27 AM 08/08/2024    6:55 AM  CBC  WBC 4.0 - 10.5 K/uL 10.3  8.9  9.8   Hemoglobin 13.0 - 17.0 g/dL 85.6  86.7  86.5   Hematocrit 39.0 - 52.0 % 43.5  40.4  40.7   Platelets 150.0 - 400.0 K/uL 254.0  230  241        Latest Ref Rng & Units 08/12/2024    8:57 AM 08/09/2024    3:27 AM 08/08/2024    6:55 AM  CMP  Glucose 70 - 99 mg/dL 848  875  88   BUN 6 - 23 mg/dL 11  6  7    Creatinine 0.40 - 1.50 mg/dL 9.15  9.11  9.12   Sodium 135 - 145 mEq/L 137  135  134   Potassium 3.5 - 5.1 mEq/L 3.9  4.4  3.9   Chloride 96 - 112 mEq/L 99  99  103   CO2 19 - 32 mEq/L 25  24  21    Calcium  8.4 - 10.5 mg/dL 8.8  8.5  8.1   Total Protein 6.0 - 8.3 g/dL 6.4     Total Bilirubin 0.2 - 1.2 mg/dL 0.7     Alkaline Phos 39 - 117 U/L 39     AST 0 - 37 U/L 24     ALT 0 - 53 U/L 24       CTAP 07/31/2024: FINDINGS:   LOWER CHEST: No acute abnormality.   LIVER: The liver is unremarkable.   GALLBLADDER AND BILE DUCTS: Gallbladder is unremarkable. No biliary ductal dilatation.   SPLEEN:  No acute abnormality.   PANCREAS: No acute abnormality.   ADRENAL GLANDS: No acute abnormality.   KIDNEYS, URETERS AND BLADDER: Bilateral Bosniak class 1 and 2 kidney cysts. The largest arises off the posterior cortex of the right kidney measuring 3.1 cm. No stones in the kidneys or ureters. No hydronephrosis. No perinephric or periureteral stranding. Urinary bladder is unremarkable.   GI AND BOWEL: Multiple dilated loops of small bowel are noted measuring up to 4.2 cm in diameter. Transitioned to decreased caliber distal small bowel identified within the right hemi coronal image 85/6. Normal caliber terminal ileum. Moderate retained stool identified within the ascending colon. Sigmoid diverticulosis. Focally inflamed sigmoid diverticula noted within the left lower quadrant/3.   PERITONEUM AND RETROPERITONEUM: Small volume of free fluid noted within the pelvis. No free air.    VASCULATURE: Aortic atherosclerosis.   LYMPH NODES: No signs of abdominopelvic adenopathy.   REPRODUCTIVE ORGANS: Prostate gland enlargement with mass effect upon the base of bladder.   BONES AND SOFT TISSUES: Degenerative changes noted within the lumbar spine. No acute or suspicious bone abnormality.   IMPRESSION: 1. Small bowel obstruction with transition point in the right hemiabdomen. 2. Uncomplicated acute sigmoid diverticulitis. No perforation or abscess. 3. Prostate gland enlargement with mass effect upon the base of bladder. 4. Aortic atherosclerotic calcification.  PAST GI PROCEDURES:    Colonoscopy 07/02/2020: - Two 8 to 10 mm polyps in the descending colon and in the transverse colon, removed with a cold snare. Resected and retrieved.  - Mild diverticulosis in the left colon.  - Internal hemorrhoids.  - The examination was otherwise normal on direct and retroflexion views. - 3 year recall colonoscopy   Surgical [P], colon, descending and transverse, polyp (2) - TUBULAR ADENOMA (2 OF 2 FRAGMENTS) - NO HIGH GRADE DYSPLASIA OR MALIGNANCY IDENTIFIED  Past Medical History:  Diagnosis Date   Allergy    Diverticulitis    GERD (gastroesophageal reflux disease)    HTN (hypertension)    Hyperlipidemia    Sleep apnea    improved, off cpap as of 2024.   Small bowel obstruction (HCC)    Tubular adenoma of colon 2010   Type II or unspecified type diabetes mellitus without mention of complication, not stated as uncontrolled    Past Surgical History:  Procedure Laterality Date   COLONOSCOPY     2015   KNEE ARTHROSCOPY Right 2014   LAPAROSCOPY N/A 08/05/2024   Procedure: LAPAROSCOPY, DIAGNOSTIC;  Surgeon: Polly Cordella LABOR, MD;  Location: [facility] OR;  Service: General;  Laterality: N/A;   VASECTOMY  1997   Dr. Amiel   Social History: He is married. He has 2 sons. Nonsmoker. No alcohol use. No drug use.   Family History: family history includes Alzheimer's disease in  his mother; Cancer in an other family member; Depression in his maternal uncle; Diabetes in his maternal uncle; Heart disease in his father; Hypertension in an other family member; Sleep apnea in his sister; Stroke in his maternal grandfather. No known family history of colorectal cancer.   Allergies  Allergen Reactions   Ozempic  (0.25 Or 0.5 Mg-Dose) [Semaglutide (0.25 Or 0.5mg -Dos)]     H/o SBO after diverticulitis.     Atorvastatin  Other (See Comments)    Joint aches   Erythromycin Nausea Only   Metformin  And Related Other (See Comments)    Jerid tolerated dose is 500mg  a day.    Oxycodone Other (See Comments)    Nausea but he can tolerate hydrocodone   Outpatient Encounter Medications as of 08/23/2024  Medication Sig   fexofenadine (ALLEGRA) 180 MG tablet Take 180 mg by mouth daily.   fluticasone (FLONASE) 50 MCG/ACT nasal spray Place 1 spray into both nostrils daily as needed.   glipiZIDE  (GLUCOTROL ) 5 MG tablet TAKE 2 TABLETS BY MOUTH DAILY BEFORE BREAKFAST   glucose blood (ONETOUCH VERIO) test strip Check sugar 1 time daily. E11.9   lisinopril  (ZESTRIL ) 5 MG tablet TAKE 1 TABLET BY MOUTH DAILY   metFORMIN  (GLUCOPHAGE ) 500 MG tablet TAKE 1 TABLET BY MOUTH DAILY WITH BREAKFAST   omeprazole  (PRILOSEC) 20 MG capsule Take 1 capsule (20 mg total) by mouth daily. (Patient taking differently: Take 20 mg by mouth daily. prn)   polyethylene glycol powder (MIRALAX ) 17 GM/SCOOP powder Take 17 g by mouth 2 (two) times daily as needed for moderate constipation or mild constipation.   sildenafil  (REVATIO ) 20 MG tablet TAKE 5 TABLETS BY MOUTH DAILY AS NEEDED   triamcinolone  cream (KENALOG ) 0.5 % Apply 1 Application topically 2 (two) times daily as needed.   No facility-administered encounter medications on file as of 08/23/2024.   REVIEW OF SYSTEMS:  Gen: Denies fever, sweats or chills. No weight loss.  CV: Denies chest pain, palpitations or edema. Resp: Denies cough, shortness of breath of  hemoptysis.  GI: Denies heartburn, dysphagia, stomach or lower abdominal pain. No diarrhea or constipation.  GU: Denies urinary burning, blood in urine, increased urinary frequency or incontinence. MS: Denies joint pain, muscles aches or weakness. Derm: Denies rash, itchiness, skin lesions or unhealing ulcers. Psych: Denies depression, anxiety, memory loss or confusion. Heme: Denies bruising, easy bleeding. Neuro:  Denies headaches, dizziness or paresthesias. Endo:  + Diabetes.   PHYSICAL EXAM: BP 112/66   Pulse 76   Ht 5' 9 (1.753 m)   Wt 262 lb 4 oz (119 kg)   SpO2 95%   BMI 38.73 kg/m  General: 68 year old male in no acute distress. Head: Normocephalic and atraumatic. Eyes:  Sclerae non-icteric, conjunctive pink. Ears: Normal auditory acuity. Mouth: Dentition intact. No ulcers or lesions.  Neck: Supple, no lymphadenopathy or thyromegaly.  Lungs: Clear bilaterally to auscultation without wheezes, crackles or rhonchi. Heart: Regular rate and rhythm. Very soft murmur, rub or gallop appreciated.  Abdomen: Soft, nontender, nondistended. No masses. No hepatosplenomegaly. Normoactive bowel sounds x 4 quadrants. Surgical incisions intact.  Rectal: Deferred.  Musculoskeletal: Symmetrical with no gross deformities. Skin: Warm and dry. No rash or lesions on visible extremities. Extremities: No edema. Neurological: Alert oriented x 4, no focal deficits.  Psychological: Alert and cooperative. Normal mood and affect.  ASSESSMENT AND PLAN:  68 year old male admitted to the hospital 07/31/2024 with N/V and abdominal pain. CTAP showed a SBO with transition point in right hemiabdomen and uncomplicated acute sigmoid diverticulitis without perforation. He failed conservative treatment and underwent a diagnostic laparoscopy 08/05/2024 that showed mildly dilated small bowel without a clear transition point but few flimsy adhesions in RLQ suggesting ileus. Patient was treated with NG tube  decompression and his clinical status eventually improved. A colonoscopy in 6 weeks recommended per general surgery.  - Colonoscopy benefits and risks discussed including risk with sedation, risk of bleeding, perforation and infection. Colonoscopy to be scheduled end of October or November 2025.  - Miralax  once daily as tolerated, reduce dose if stools become too loose. May take twice daily if needed. Avoid constipation/straining. - Drink 64 ounces of water daily   Uncomplicated sigmoid diverticulitis per CTAP, treated with antibiotics  x 10 days as an inpatient. No prior history of diverticulitis.  - Patient to contact our office if abdominal pain recurs   History of colon polyps. Colonoscopy 07/02/2020 identified two 8 to 10 mm tubular adenomatous polyps which were removed from the colon.  - Colonoscopy as ordered above   DM type II on Metformin  and Glipizide      CC:  Brett Arlyss RAMAN, MD

## 2024-08-23 NOTE — Patient Instructions (Signed)
 Start taking Miralax  1 capful (17 grams) 1x / day for 1 week.   If this is not effective, increase to 1 dose 2x / day for 1 week.   If this is still not effective, increase to two capfuls (34 grams) 2x / day.   Can adjust dose as needed based on response. Can take 1/2 cap daily, skip days, or increase per day.    Drink 64 oz of water daily.  You have been scheduled for a colonoscopy. Please follow written instructions given to you at your visit today.   If you use inhalers (even only as needed), please bring them with you on the day of your procedure.  DO NOT TAKE 7 DAYS PRIOR TO TEST- Trulicity (dulaglutide) Ozempic , Wegovy  (semaglutide ) Mounjaro (tirzepatide) Bydureon Bcise (exanatide extended release)  DO NOT TAKE 1 DAY PRIOR TO YOUR TEST Rybelsus  (semaglutide ) Adlyxin (lixisenatide) Victoza (liraglutide) Byetta (exanatide) ___________________________________________________________________________   Thank you for trusting me with your gastrointestinal care!   Elida Shawl, CRNP    _______________________________________________________  If your blood pressure at your visit was 140/90 or greater, please contact your primary care physician to follow up on this.  _______________________________________________________  If you are age 72 or older, your body mass index should be between 23-30. Your Body mass index is 38.73 kg/m. If this is out of the aforementioned range listed, please consider follow up with your Primary Care Provider.  If you are age 21 or younger, your body mass index should be between 19-25. Your Body mass index is 38.73 kg/m. If this is out of the aformentioned range listed, please consider follow up with your Primary Care Provider.   ________________________________________________________  The [facility] providers would like to encourage you to use MYCHART to communicate with providers for non-urgent requests or questions.  Due to long hold  times on the telephone, sending your provider a message by [facility] may be a faster and more efficient way to get a response.  Please allow 48 business hours for a response.  Please remember that this is for non-urgent requests.  _______________________________________________________  [facility] is using a team-based approach to care.  Your team is made up of your doctor and two to three APPS. Our APPS (Nurse Practitioners and Physician Assistants) work with your physician to ensure care continuity for you. They are fully qualified to address your health concerns and develop a treatment plan. They communicate directly with your gastroenterologist to care for you. Seeing the Advanced Practice Practitioners on your physician's team can help you by facilitating care more promptly, often allowing for earlier appointments, access to diagnostic testing, procedures, and other specialty referrals.

## 2024-08-25 NOTE — Progress Notes (Signed)
 Agree with assessment and plan as outlined.

## 2024-08-25 NOTE — Progress Notes (Signed)
 Dr. Leigh, FYI patient is scheduled for a colonoscopy with you on 10/27/205.

## 2024-08-25 NOTE — Progress Notes (Signed)
 He is scheduled for 10/27 @ 8am.  I must not have saved it, but it is in now.

## 2024-09-01 DIAGNOSIS — K5792 Diverticulitis of intestine, part unspecified, without perforation or abscess without bleeding: Secondary | ICD-10-CM | POA: Diagnosis not present

## 2024-09-01 DIAGNOSIS — K529 Noninfective gastroenteritis and colitis, unspecified: Secondary | ICD-10-CM | POA: Diagnosis not present

## 2024-09-01 DIAGNOSIS — Z9889 Other specified postprocedural states: Secondary | ICD-10-CM | POA: Diagnosis not present

## 2024-09-09 ENCOUNTER — Ambulatory Visit

## 2024-10-03 ENCOUNTER — Encounter: Payer: Self-pay | Admitting: Gastroenterology

## 2024-10-10 ENCOUNTER — Ambulatory Visit: Admitting: Gastroenterology

## 2024-10-10 ENCOUNTER — Encounter: Payer: Self-pay | Admitting: Gastroenterology

## 2024-10-10 VITALS — BP 111/74 | HR 66 | Temp 97.5°F | Resp 16 | Ht 69.0 in | Wt 262.4 lb

## 2024-10-10 DIAGNOSIS — K5730 Diverticulosis of large intestine without perforation or abscess without bleeding: Secondary | ICD-10-CM | POA: Diagnosis not present

## 2024-10-10 DIAGNOSIS — D123 Benign neoplasm of transverse colon: Secondary | ICD-10-CM | POA: Diagnosis not present

## 2024-10-10 DIAGNOSIS — K635 Polyp of colon: Secondary | ICD-10-CM

## 2024-10-10 DIAGNOSIS — Z1211 Encounter for screening for malignant neoplasm of colon: Secondary | ICD-10-CM | POA: Diagnosis not present

## 2024-10-10 DIAGNOSIS — K621 Rectal polyp: Secondary | ICD-10-CM | POA: Diagnosis not present

## 2024-10-10 DIAGNOSIS — I10 Essential (primary) hypertension: Secondary | ICD-10-CM | POA: Diagnosis not present

## 2024-10-10 DIAGNOSIS — E119 Type 2 diabetes mellitus without complications: Secondary | ICD-10-CM | POA: Diagnosis not present

## 2024-10-10 DIAGNOSIS — C801 Malignant (primary) neoplasm, unspecified: Secondary | ICD-10-CM | POA: Insufficient documentation

## 2024-10-10 DIAGNOSIS — Z860101 Personal history of adenomatous and serrated colon polyps: Secondary | ICD-10-CM | POA: Diagnosis not present

## 2024-10-10 DIAGNOSIS — K648 Other hemorrhoids: Secondary | ICD-10-CM

## 2024-10-10 DIAGNOSIS — D125 Benign neoplasm of sigmoid colon: Secondary | ICD-10-CM | POA: Diagnosis not present

## 2024-10-10 DIAGNOSIS — D128 Benign neoplasm of rectum: Secondary | ICD-10-CM

## 2024-10-10 DIAGNOSIS — Z860100 Personal history of colon polyps, unspecified: Secondary | ICD-10-CM

## 2024-10-10 DIAGNOSIS — K5732 Diverticulitis of large intestine without perforation or abscess without bleeding: Secondary | ICD-10-CM

## 2024-10-10 DIAGNOSIS — D127 Benign neoplasm of rectosigmoid junction: Secondary | ICD-10-CM | POA: Diagnosis not present

## 2024-10-10 MED ORDER — SODIUM CHLORIDE 0.9 % IV SOLN: 500.0000 mL | INTRAVENOUS | Status: DC

## 2024-10-10 NOTE — Patient Instructions (Signed)

## 2024-10-10 NOTE — Progress Notes (Signed)
 Called to room to assist during endoscopic procedure.  Patient ID and intended procedure confirmed with present staff. Received instructions for my participation in the procedure from the performing physician.

## 2024-10-10 NOTE — Op Note (Signed)
 [facility] Patient Name: Brett Stanley Procedure Date: 10/10/2024 8:20 AM MRN: [id number] Endoscopist: Elspeth P. Leigh , MD, [id number] Age: 68 Referring MD:  Date of Birth: [birth year] Gender: Male Account #: [id number] Procedure:                Colonoscopy Indications:              High risk colon cancer surveillance: Personal                            history of colonic polyps - 06/2020 - 2 adenomas                            removed, one advanced. First time episode of                            diverticulitis in August Medicines:                Monitored Anesthesia Care Procedure:                Pre-Anesthesia Assessment:                           - Prior to the procedure, a History and Physical                            was performed, and patient medications and                            allergies were reviewed. The patient's tolerance of                            previous anesthesia was also reviewed. The risks                            and benefits of the procedure and the sedation                            options and risks were discussed with the patient.                            All questions were answered, and informed consent                            was obtained. Prior Anticoagulants: The patient has                            taken no anticoagulant or antiplatelet agents. ASA                            Grade Assessment: II - A patient with mild systemic                            disease. After reviewing the risks and benefits,  the patient was deemed in satisfactory condition to                            undergo the procedure.                           After obtaining informed consent, the colonoscope                            was passed under direct vision. Throughout the                            procedure, the patient's blood pressure, pulse, and                            oxygen saturations were monitored  continuously. The                            Olympus CF-HQ190L ([id number]) Colonoscope was                            introduced through the anus and advanced to the the                            cecum, identified by appendiceal orifice and                            ileocecal valve. The colonoscopy was performed                            without difficulty. The patient tolerated the                            procedure well. The quality of the bowel                            preparation was adequate. The ileocecal valve,                            appendiceal orifice, and rectum were photographed. Scope In: 8:23:43 AM Scope Out: 8:43:24 AM Scope Withdrawal Time: 0 hours 13 minutes 42 seconds  Total Procedure Duration: 0 hours 19 minutes 41 seconds  Findings:                 The perianal and digital rectal examinations were                            normal.                           A 3 mm polyp was found in the transverse colon. The                            polyp was sessile. The polyp was removed with a  cold snare. Resection and retrieval were complete.                           A 3 mm polyp was found in the sigmoid colon. The                            polyp was sessile. The polyp was removed with a                            cold snare. Resection and retrieval were complete.                           Multiple diverticula were found in the sigmoid                            colon.                           A 3 mm polyp was found in the rectum. The polyp was                            sessile. The polyp was removed with a cold snare.                            Resection and retrieval were complete.                           Internal hemorrhoids were found during retroflexion.                           The exam was otherwise without abnormality. Complications:            No immediate complications. Estimated blood loss:                             Minimal. Estimated Blood Loss:     Estimated blood loss was minimal. Impression:               - One 3 mm polyp in the transverse colon, removed                            with a cold snare. Resected and retrieved.                           - One 3 mm polyp in the sigmoid colon, removed with                            a cold snare. Resected and retrieved.                           - Diverticulosis in the sigmoid colon.                           - One 3 mm polyp in the rectum, removed  with a cold                            snare. Resected and retrieved.                           - Internal hemorrhoids.                           - The examination was otherwise normal. Recommendation:           - Patient has a contact number available for                            emergencies. The signs and symptoms of potential                            delayed complications were discussed with the                            patient. Return to normal activities tomorrow.                            Written discharge instructions were provided to the                            patient.                           - Resume previous diet.                           - Continue present medications.                           - Await pathology results. Anticipate repeat                            colonoscopy in 5 years given advanced polyp removed                            on the last exam Larina Lieurance P. Lee Kuang, MD 10/10/2024 8:50:25 AM This report has been signed electronically.

## 2024-10-10 NOTE — Progress Notes (Signed)
 [facility] History and Physical   Primary Care Physician:  [name], MD   Reason for Procedure:   History of polyps, history of diverticulitis  Plan:    colonoscopy     HPI: Brett Stanley is a 68 y.o. male  here for colonoscopy - history of advanced adenomas. Also had sigmoid diverticulitis noted in August. Last colonoscopy July 2021 - advanced adenoma removed, 2 sigmoid polyps. First time episode of diverticulitis, doing well since then  Patient denies any bowel symptoms at this time. No family history of colon cancer known. Otherwise feels well without any cardiopulmonary symptoms.   I have discussed risks / benefits of anesthesia and endoscopic procedure with [name] and they wish to proceed with the exams as outlined today.    Past Medical History:  Diagnosis Date   Allergy    Diverticulitis    GERD (gastroesophageal reflux disease)    HTN (hypertension)    Hyperlipidemia    Sleep apnea    improved, off cpap as of 2024.   Small bowel obstruction (HCC)    Tubular adenoma of colon 2010   Type II or unspecified type diabetes mellitus without mention of complication, not stated as uncontrolled     Past Surgical History:  Procedure Laterality Date   COLONOSCOPY     2015   KNEE ARTHROSCOPY Right 2014   LAPAROSCOPY N/A 08/05/2024   Procedure: LAPAROSCOPY, DIAGNOSTIC;  Surgeon: Polly Cordella LABOR, MD;  Location: [facility] OR;  Service: General;  Laterality: N/A;   VASECTOMY  1997   Dr. Amiel    Prior to Admission medications   Medication Sig Start Date End Date Taking? Authorizing Provider  fexofenadine (ALLEGRA) 180 MG tablet Take 180 mg by mouth daily.    [provider]  fluticasone (FLONASE) 50 MCG/ACT nasal spray Place 1 spray into both nostrils daily as needed.    [provider]  glipiZIDE  (GLUCOTROL ) 5 MG tablet TAKE 2 TABLETS BY MOUTH DAILY BEFORE BREAKFAST 03/04/24   [name], MD  glucose blood (ONETOUCH VERIO)  test strip Check sugar 1 time daily. E11.9 10/20/22   [name], MD  lisinopril  (ZESTRIL ) 5 MG tablet TAKE 1 TABLET BY MOUTH DAILY 03/04/24   [name], MD  metFORMIN  (GLUCOPHAGE ) 500 MG tablet TAKE 1 TABLET BY MOUTH DAILY WITH BREAKFAST 03/04/24   [name], MD  omeprazole  (PRILOSEC) 20 MG capsule Take 1 capsule (20 mg total) by mouth daily. Patient taking differently: Take 20 mg by mouth daily. prn 08/12/24   [name], MD  polyethylene glycol powder (MIRALAX ) 17 GM/SCOOP powder Take 17 g by mouth 2 (two) times daily as needed for moderate constipation or mild constipation. 08/09/24   Gonfa, Taye T, MD  sildenafil  (REVATIO ) 20 MG tablet TAKE 5 TABLETS BY MOUTH DAILY AS NEEDED 05/25/24   [name], MD  triamcinolone  cream (KENALOG ) 0.5 % Apply 1 Application topically 2 (two) times daily as needed. 07/15/22   [name], MD    Current Outpatient Medications  Medication Sig Dispense Refill   fexofenadine (ALLEGRA) 180 MG tablet Take 180 mg by mouth daily.     fluticasone (FLONASE) 50 MCG/ACT nasal spray Place 1 spray into both nostrils daily as needed.     glipiZIDE  (GLUCOTROL ) 5 MG tablet TAKE 2 TABLETS BY MOUTH DAILY BEFORE BREAKFAST 180 tablet 3   glucose blood (ONETOUCH VERIO) test strip Check sugar 1 time daily. E11.9 100 each 3  lisinopril  (ZESTRIL ) 5 MG tablet TAKE 1 TABLET BY MOUTH DAILY 90 tablet 3   metFORMIN  (GLUCOPHAGE ) 500 MG tablet TAKE 1 TABLET BY MOUTH DAILY WITH BREAKFAST 90 tablet 3   omeprazole  (PRILOSEC) 20 MG capsule Take 1 capsule (20 mg total) by mouth daily. (Patient taking differently: Take 20 mg by mouth daily. prn)     polyethylene glycol powder (MIRALAX ) 17 GM/SCOOP powder Take 17 g by mouth 2 (two) times daily as needed for moderate constipation or mild constipation. 255 g 2   sildenafil  (REVATIO ) 20 MG tablet TAKE 5 TABLETS BY MOUTH DAILY AS NEEDED 50 tablet 12   triamcinolone  cream (KENALOG ) 0.5 % Apply 1 Application topically 2  (two) times daily as needed. 454 g 0   Current Facility-Administered Medications  Medication Dose Route Frequency Provider Last Rate Last Admin   0.9 %  sodium chloride  infusion  500 mL Intravenous Continuous Emaya Preston, Elspeth SQUIBB, MD        Allergies as of 10/10/2024 - Review Complete 10/10/2024  Allergen Reaction Noted   Ozempic  (0.25 or 0.5 mg-dose) [semaglutide (0.25 or 0.5mg -dos)]  08/12/2024   Atorvastatin  Other (See Comments) 11/26/2018   Erythromycin Nausea Only 11/15/2012   Metformin  and related Other (See Comments) 06/26/2016   Oxycodone Other (See Comments) 06/02/2013    Family History  Problem Relation Age of Onset   Alzheimer's disease Mother    Heart disease Father    Sleep apnea Sister    Diabetes Maternal Uncle    Depression Maternal Uncle    Stroke Maternal Grandfather    Hypertension Other    Cancer Other        Leukemia   Colon cancer Neg Hx    Prostate cancer Neg Hx    Colon polyps Neg Hx    Esophageal cancer Neg Hx    Rectal cancer Neg Hx    Stomach cancer Neg Hx    Pancreatic cancer Neg Hx     Social History   Socioeconomic History   Marital status: Married    Spouse name: Not on file   Number of children: 2   Years of education: Not on file   Highest education level: Not on file  Occupational History   Occupation: Warden/ranger    Employer: [employer]  Tobacco Use   Smoking status: Never    Passive exposure: Past   Smokeless tobacco: Former    Types: Engineer, Drilling   Vaping status: Never Used  Substance and Sexual Activity   Alcohol use: Yes    Alcohol/week: 0.0 standard drinks of alcohol    Comment: very rare use   Drug use: No   Sexual activity: Not on file  Other Topics Concern   Not on file  Social History Narrative   Married 1988   2 sons   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 [employer]   Social Drivers of [employer] Strain: Not on file  Food Insecurity: No Food  Insecurity (08/01/2024)   Hunger Vital Sign    Worried About Running Out of Food in the Last Year: Never true    Ran Out of Food in the Last Year: Never true  Transportation Needs: No Transportation Needs (08/01/2024)   PRAPARE - Administrator, Civil Service (Medical): No    Lack of Transportation (Non-Medical): No  Physical Activity: Not on file  Stress: Not on file  Social Connections: Unknown (08/01/2024)   Social Connection and  Isolation Panel    Frequency of Communication with Friends and Family: Twice a week    Frequency of Social Gatherings with Friends and Family: Twice a week    Attends Religious Services: 1 to 4 times per year    Active Member of [employer] or Organizations: No    Attends Banker Meetings: Never    Marital Status: Not on file  Intimate Partner Violence: Not At Risk (08/01/2024)   Humiliation, Afraid, Rape, and Kick questionnaire    Fear of Current or Ex-Partner: No    Emotionally Abused: No    Physically Abused: No    Sexually Abused: No    Review of Systems: All other review of systems negative except as mentioned in the HPI.  Physical Exam: Vital signs BP 123/75   Pulse 76   Temp (!) 97.5 F (36.4 C) (Temporal)   Ht 5' 9 (1.753 m)   Wt 262 lb 6.4 oz (119 kg)   SpO2 95%   BMI 38.75 kg/m   General:   Alert,  Well-developed, pleasant and cooperative in NAD Lungs:  Clear throughout to auscultation.   Heart:  Regular rate and rhythm Abdomen:  Soft, nontender and nondistended.   Neuro/Psych:  Alert and cooperative. Normal mood and affect. A and O x 3  Marcey Naval, MD [facility]

## 2024-10-10 NOTE — Progress Notes (Signed)
 Report to PACU, RN, vss, BBS= Clear.

## 2024-10-11 ENCOUNTER — Telehealth: Payer: Self-pay

## 2024-10-11 NOTE — Telephone Encounter (Signed)
 Left message on answering machine.

## 2024-10-11 NOTE — Telephone Encounter (Signed)
 Patient returned call, he states he is doing well.

## 2024-10-12 ENCOUNTER — Ambulatory Visit: Payer: Self-pay | Admitting: Gastroenterology

## 2024-10-12 LAB — SURGICAL PATHOLOGY

## 2024-11-01 ENCOUNTER — Ambulatory Visit (INDEPENDENT_AMBULATORY_CARE_PROVIDER_SITE_OTHER): Admitting: *Deleted

## 2024-11-01 DIAGNOSIS — Z23 Encounter for immunization: Secondary | ICD-10-CM | POA: Diagnosis not present

## 2024-11-01 NOTE — Progress Notes (Signed)
 Per orders of Arlyss Solian, MD injection of Influenza Vaccine given by Manuelita JAYSON Frost in right deltoid. Patient tolerated injection well.

## 2024-11-18 ENCOUNTER — Ambulatory Visit

## 2024-11-18 VITALS — Ht 69.0 in | Wt 262.0 lb

## 2024-11-18 DIAGNOSIS — Z0000 Encounter for general adult medical examination without abnormal findings: Secondary | ICD-10-CM

## 2024-11-18 NOTE — Progress Notes (Signed)
 Chief Complaint  Patient presents with   Medicare Wellness     Subjective:   Brett Stanley is a 68 y.o. male who presents for a Medicare Annual Wellness Visit.  Visit info / Clinical Intake: Medicare Wellness Visit Type:: Subsequent Annual Wellness Visit Medicare Wellness Visit Mode:: Telephone If telephone:: video declined Since this visit was completed virtually, some vitals may be partially provided or unavailable. Missing vitals are due to the limitations of the virtual format.: Unable to obtain vitals - no equipment If Telephone or Video please confirm:: I connected with patient using audio/video enable telemedicine. I verified patient identity with two identifiers, discussed telehealth limitations, and patient agreed to proceed. Patient Location:: home Provider Location:: clinic Interpreter Needed?: No Pre-visit prep was completed: yes AWV questionnaire completed by patient prior to visit?: no Living arrangements:: lives with spouse/significant other Patient's Overall Health Status Rating: very good Typical amount of pain: none Does pain affect daily life?: no Are you currently prescribed opioids?: no  Dietary Habits and Nutritional Risks How many meals a day?: 3 Eats fruit and vegetables daily?: yes Most meals are obtained by: preparing own meals In the last 2 weeks, have you had any of the following?: none Diabetic:: (!) yes Any non-healing wounds?: no How often do you check your BS?: 1; as needed Would you like to be referred to a Nutritionist or for Diabetic Management? : no  Functional Status Activities of Daily Living (to include ambulation/medication): Independent Ambulation: Independent Medication Administration: Independent Home Management (perform basic housework or laundry): Independent Manage your own finances?: yes Primary transportation is: driving Concerns about vision?: no *vision screening is required for WTM* Concerns about hearing?: no  Fall  Screening Falls in the past year?: 0 Number of falls in past year: 0 Was there an injury with Fall?: 0 Fall Risk Category Calculator: 0 Patient Fall Risk Level: Low Fall Risk  Fall Risk Patient at Risk for Falls Due to: No Fall Risks Fall risk Follow up: Education provided; Falls prevention discussed  Home and Transportation Safety: All rugs have non-skid backing?: N/A, no rugs All stairs or steps have railings?: yes Grab bars in the bathtub or shower?: yes Have non-skid surface in bathtub or shower?: yes Good home lighting?: yes Regular seat belt use?: yes Hospital stays in the last year:: (!) yes How many hospital stays:: 9 Reason: small bowel obstruction;pt says 1st admisiion in his life  Cognitive Assessment Difficulty concentrating, remembering, or making decisions? : no Will 6CIT or Mini Cog be Completed: yes What year is it?: 0 points What month is it?: 0 points Give patient an address phrase to remember (5 components): [address] [place]  About what time is it?: 0 points Count backwards from 20 to 1: 0 points Say the months of the year in reverse: 0 points Repeat the address phrase from earlier: 0 points 6 CIT Score: 0 points  Advance Directives (For Healthcare) Does Patient Have a Medical Advance Directive?: Yes Does patient want to make changes to medical advance directive?: No - Guardian declined Type of Advance Directive: Living will; Healthcare Power of [employer] of Healthcare Power of Attorney in Chart?: No - copy requested Healthcare Power of Attorney Requested and Now in Chart: Copy in chart Copy of Living Will in Chart?: No - copy requested  Reviewed/Updated  Reviewed/Updated: Reviewed All (Medical, Surgical, Family, Medications, Allergies, Care Teams, Patient Goals)    Allergies (verified) Atorvastatin , Erythromycin, Metformin  and related, and Oxycodone   Current Medications (  verified) Outpatient Encounter Medications as of 11/18/2024   Medication Sig   fexofenadine (ALLEGRA) 180 MG tablet Take 180 mg by mouth daily.   fluticasone (FLONASE) 50 MCG/ACT nasal spray Place 1 spray into both nostrils daily as needed.   glipiZIDE  (GLUCOTROL ) 5 MG tablet TAKE 2 TABLETS BY MOUTH DAILY BEFORE BREAKFAST   glucose blood (ONETOUCH VERIO) test strip Check sugar 1 time daily. E11.9   lisinopril  (ZESTRIL ) 5 MG tablet TAKE 1 TABLET BY MOUTH DAILY   metFORMIN  (GLUCOPHAGE ) 500 MG tablet TAKE 1 TABLET BY MOUTH DAILY WITH BREAKFAST   omeprazole  (PRILOSEC) 20 MG capsule Take 1 capsule (20 mg total) by mouth daily. (Patient taking differently: Take 20 mg by mouth daily. prn)   polyethylene glycol powder (MIRALAX ) 17 GM/SCOOP powder Take 17 g by mouth 2 (two) times daily as needed for moderate constipation or mild constipation.   sildenafil  (REVATIO ) 20 MG tablet TAKE 5 TABLETS BY MOUTH DAILY AS NEEDED   triamcinolone  cream (KENALOG ) 0.5 % Apply 1 Application topically 2 (two) times daily as needed.   No facility-administered encounter medications on file as of 11/18/2024.    History: Past Medical History:  Diagnosis Date   Allergy    Cancer (HCC)    skin cancers on arms   Diverticulitis    GERD (gastroesophageal reflux disease)    HTN (hypertension)    Hyperlipidemia    Sleep apnea    improved, off cpap as of 2024.   Small bowel obstruction (HCC)    Tubular adenoma of colon 2010   Type II or unspecified type diabetes mellitus without mention of complication, not stated as uncontrolled    Past Surgical History:  Procedure Laterality Date   COLONOSCOPY     2015   KNEE ARTHROSCOPY Right 2014   LAPAROSCOPY N/A 08/05/2024   Procedure: LAPAROSCOPY, DIAGNOSTIC;  Surgeon: Polly Cordella LABOR, MD;  Location: [facility] OR;  Service: General;  Laterality: N/A;   VASECTOMY  1997   Dr. Amiel   Family History  Problem Relation Age of Onset   Alzheimer's disease Mother    Heart disease Father    Sleep apnea Sister    Diabetes Maternal Uncle     Depression Maternal Uncle    Stroke Maternal Grandfather    Hypertension Other    Cancer Other        Leukemia   Colon cancer Neg Hx    Prostate cancer Neg Hx    Colon polyps Neg Hx    Esophageal cancer Neg Hx    Rectal cancer Neg Hx    Stomach cancer Neg Hx    Pancreatic cancer Neg Hx    Social History   Occupational History   Occupation: English As A Second Language Teacher: [employer]  Tobacco Use   Smoking status: Never    Passive exposure: Past   Smokeless tobacco: Former    Types: Engineer, Drilling   Vaping status: Never Used  Substance and Sexual Activity   Alcohol use: Yes    Alcohol/week: 0.0 standard drinks of alcohol    Comment: very rare use   Drug use: No   Sexual activity: Not on file   Tobacco Counseling Counseling given: Not Answered  SDOH Screenings   Food Insecurity: No Food Insecurity (11/18/2024)  Housing: Unknown (11/18/2024)  Transportation Needs: No Transportation Needs (11/18/2024)  Utilities: Not At Risk (11/18/2024)  Alcohol Screen: Low Risk  (11/18/2024)  Depression (PHQ2-9): Low Risk  (11/18/2024)  Financial Resource Strain: Low  Risk  (11/18/2024)  Physical Activity: Sufficiently Active (11/18/2024)  Social Connections: Moderately Integrated (11/18/2024)  Stress: No Stress Concern Present (11/18/2024)  Tobacco Use: Medium Risk (11/18/2024)  Health Literacy: Adequate Health Literacy (11/18/2024)   See flowsheets for full screening details  Depression Screen PHQ 2 & 9 Depression Scale- Over the past 2 weeks, how often have you been bothered by any of the following problems? Little interest or pleasure in doing things: 0 Feeling down, depressed, or hopeless (PHQ Adolescent also includes...irritable): 0 PHQ-2 Total Score: 0 Trouble falling or staying asleep, or sleeping too much: 0 Feeling tired or having little energy: 0 Poor appetite or overeating (PHQ Adolescent also includes...weight loss): 0 Feeling bad about yourself - or that you  are a failure or have let yourself or your family down: 0 Trouble concentrating on things, such as reading the newspaper or watching television (PHQ Adolescent also includes...like school work): 0 Moving or speaking so slowly that other people could have noticed. Or the opposite - being so fidgety or restless that you have been moving around a lot more than usual: 0 Thoughts that you would be better off dead, or of hurting yourself in some way: 0 PHQ-9 Total Score: 0 If you checked off any problems, how difficult have these problems made it for you to do your work, take care of things at home, or get along with other people?: Not difficult at all     Goals Addressed             This Visit's Progress    I would like to watch my food intake and lose a little more weight               Objective:    Today's Vitals   11/18/24 0853  Weight: 262 lb (118.8 kg)  Height: 5' 9 (1.753 m)   Body mass index is 38.69 kg/m.  Hearing/Vision screen Vision Screening - Comments:: UTD w/visits to Dr Joshua Immunizations and Health Maintenance Health Maintenance  Topic Date Due   OPHTHALMOLOGY EXAM  09/03/2024   HEMOGLOBIN A1C  10/11/2024   Diabetic kidney evaluation - Urine ACR  04/11/2025   FOOT EXAM  04/18/2025   Diabetic kidney evaluation - eGFR measurement  08/12/2025   Medicare Annual Wellness (AWV)  11/18/2025   DTaP/Tdap/Td (3 - Td or Tdap) 04/08/2027   Colonoscopy  10/10/2029   Pneumococcal Vaccine: 50+ Years  Completed   Influenza Vaccine  Completed   Hepatitis C Screening  Completed   Zoster Vaccines- Shingrix  Completed   Meningococcal B Vaccine  Aged Out   COVID-19 Vaccine  Discontinued        Assessment/Plan:  This is a routine wellness examination for Brett Stanley.  Patient Care Team: [name], MD as PCP - General (Family Medicine) Joshua Rush, OD (Optometry) Neda Jennet LABOR, MD as Consulting Physician (Pulmonary Disease)  I have personally reviewed and noted  the following in the patient's chart:   Medical and social history Use of alcohol, tobacco or illicit drugs  Current medications and supplements including opioid prescriptions. Functional ability and status Nutritional status Physical activity Advanced directives List of other physicians Hospitalizations, surgeries, and ER visits in previous 12 months Vitals Screenings to include cognitive, depression, and falls Referrals and appointments  No orders of the defined types were placed in this encounter.  In addition, I have reviewed and discussed with patient certain preventive protocols, quality metrics, and best practice recommendations. A written personalized care plan  for preventive services as well as general preventive health recommendations were provided to patient.   Brett LITTIE Saris, LPN   [id number]   Return in 1 year (on 11/18/2025).  After Visit Summary: (MyChart) Due to this being a telephonic visit, the after visit summary with patients personalized plan was offered to patient via MyChart   Nurse Notes: Pt has no concerns or questions. 51mo f/u/Lab  visit/CPE made simultaneously for one year

## 2024-11-18 NOTE — Patient Instructions (Signed)
 Mr. Purohit,  Thank you for taking the time for your Medicare Wellness Visit. I appreciate your continued commitment to your health goals. Please review the care plan we discussed, and feel free to reach out if I can assist you further.  Please note that Annual Wellness Visits do not include a physical exam. Some assessments may be limited, especially if the visit was conducted virtually. If needed, we may recommend an in-person follow-up with your provider.  Ongoing Care Seeing your primary care provider every 3 to 6 months helps us  monitor your health and provide consistent, personalized care.   Referrals If a referral was made during today's visit and you haven't received any updates within two weeks, please contact the referred provider directly to check on the status.  Recommended Screenings:  Health Maintenance  Topic Date Due   Medicare Annual Wellness Visit  04/16/2024   Eye exam for diabetics  09/03/2024   Hemoglobin A1C  10/11/2024   Yearly kidney health urinalysis for diabetes  04/11/2025   Complete foot exam   04/18/2025   Yearly kidney function blood test for diabetes  08/12/2025   DTaP/Tdap/Td vaccine (3 - Td or Tdap) 04/08/2027   Colon Cancer Screening  10/10/2029   Pneumococcal Vaccine for age over 49  Completed   Flu Shot  Completed   Hepatitis C Screening  Completed   Zoster (Shingles) Vaccine  Completed   Meningitis B Vaccine  Aged Out   COVID-19 Vaccine  Discontinued       11/18/2024    9:00 AM  Advanced Directives  Does Patient Have a Medical Advance Directive? Yes  Type of Advance Directive Living will;Healthcare Power of Attorney  Copy of Healthcare Power of Attorney in Chart? No - copy requested    Vision: Annual vision screenings are recommended for early detection of glaucoma, cataracts, and diabetic retinopathy. These exams can also reveal signs of chronic conditions such as diabetes and high blood pressure.  Dental: Annual dental screenings help  detect early signs of oral cancer, gum disease, and other conditions linked to overall health, including heart disease and diabetes.

## 2024-11-22 NOTE — Addendum Note (Signed)
 Addended by: ESTELLA ERMINIO CROME on: 11/22/2024 10:36 AM   Modules accepted: Level of Service

## 2024-11-22 NOTE — Progress Notes (Signed)
 Hi! This was initial AWV. Code changed and noted here. Thanks!

## 2024-12-28 ENCOUNTER — Ambulatory Visit (INDEPENDENT_AMBULATORY_CARE_PROVIDER_SITE_OTHER)

## 2024-12-28 VITALS — BP 104/76 | HR 80 | Temp 99.0°F | Ht 69.0 in | Wt 272.0 lb

## 2024-12-28 DIAGNOSIS — J329 Chronic sinusitis, unspecified: Secondary | ICD-10-CM

## 2024-12-28 DIAGNOSIS — J029 Acute pharyngitis, unspecified: Secondary | ICD-10-CM | POA: Diagnosis not present

## 2024-12-28 DIAGNOSIS — B9789 Other viral agents as the cause of diseases classified elsewhere: Secondary | ICD-10-CM | POA: Diagnosis not present

## 2024-12-28 LAB — POCT RAPID STREP A (OFFICE): Rapid Strep A Screen: NEGATIVE

## 2024-12-28 LAB — POC COVID19 BINAXNOW: SARS Coronavirus 2 Ag: NEGATIVE

## 2024-12-28 MED ORDER — OXYMETAZOLINE HCL 0.05 % NA SOLN: 1.0000 | Freq: Two times a day (BID) | NASAL | 0 refills | Status: AC

## 2024-12-28 MED ORDER — PROMETHAZINE-DM 6.25-15 MG/5ML PO SYRP: 5.0000 mL | ORAL_SOLUTION | Freq: Every evening | ORAL | 0 refills | Status: AC

## 2024-12-28 MED ORDER — GUAIFENESIN ER 600 MG PO TB12: 600.0000 mg | ORAL_TABLET | Freq: Two times a day (BID) | ORAL | 0 refills | Status: AC

## 2024-12-28 NOTE — Progress Notes (Signed)
 "  Subjective:   This visit was conducted in person. The patient gave informed consent to the use of Abridge AI technology to record the contents of the encounter as documented below.   Patient ID: Brett Stanley, male    DOB: 01-28-1956, 69 y.o.   MRN: [id number]   Discussed the use of AI scribe software for clinical note transcription with the patient, who gave verbal consent to proceed.  History of Present Illness Brett Stanley is a 69 year old male who presents with a productive cough and sinus congestion.  Symptoms began approximately four days ago with a 'tickling' sensation in his throat, progressing to a productive cough with yellow sputum, primarily in the mornings, and significant nasal drainage. He experiences throat soreness, particularly after coughing at night, which disrupts his sleep. He has been using Nyquil and DayQuil, which provide some relief by slowing the symptoms.  He notes a sensation of sinus pressure and frontal headache, similar to previous sinus infections and allergies. No chest pain, difficulty breathing, or body aches. He reports a low-grade fever, with his temperature reaching 69F, slightly elevated from his usual baseline of 97.7-97.76F. No chills, but he mentions feeling chilly as he has aged.  He has not been in close contact with anyone known to be sick but has been in public spaces where others were coughing and sneezing. He denies any significant nasal congestion requiring frequent blowing but feels 'stocked up' with drainage going down his throat.  His past medical history includes a recent hospitalization for laparoscopic surgery, and he reports some tenderness in the surgical area when he coughs. His current medications include glipizide , metformin , lisinopril , and sildenafil .    Review of Systems  All other systems reviewed and are negative.       Allergies[1]  Medications Ordered Prior to Encounter[2]  BP 104/76 (BP Location: Right Arm,  Patient Position: Sitting, Cuff Size: Large)   Pulse 80   Temp 99 F (37.2 C) (Oral)   Ht 5' 9 (1.753 m)   Wt 272 lb (123.4 kg)   SpO2 96%   BMI 40.17 kg/m   Objective:      Physical Exam GENERAL: Alert, cooperative, well developed, no acute distress. HEAD: Normocephalic atraumatic. Frontal and maxillary sinus tenderness bilaterally. EYES: Extraocular movements intact BL, pupils round, equal and reactive to light BL, conjunctivae normal BL. NOSE: No congestion or rhinorrhea, mucous membranes are moist. THROAT: Mild Posterior oropharyngeal erythema. CARDIOVASCULAR: Normal heart rate and rhythm, S1 and S2 normal without murmurs. CHEST: Clear to auscultation bilaterally, no wheezes, rhonchi, or crackles. EXTREMITIES: No cyanosis or edema. NEUROLOGICAL: Oriented to person, place and time, no gait abnormalities, moves all extremities without gross motor or sensory deficit. NECK: Mild cervical lymphadenopathy.       Assessment & Plan:   Assessment & Plan Viral  sinusitis with sore throat Negative COVID-19 and strep tests suggest viral etiology. Symptoms and duration indicate viral sinusitis over bacterial.  - Prescribed Afrin nasal spray, one spray each nostril twice daily for three days. - Prescribed promethazine  syrup, 5 mL at night for ten days. - Prescribed Mucinex , one tablet twice daily for congestion - Advised on hand hygiene and mask use. - Instructed to monitor symptoms and follow up if no improvement after seven days, if sinusitis unimproved with consider antibiotic course for bacterial cause.   No follow-ups on file.   Zyra Parrillo K Gertha Lichtenberg, MD  12/28/2024     Contains text generated by Abridge.       [  1]  Allergies Allergen Reactions   Atorvastatin  Other (See Comments)    Joint aches   Erythromycin Nausea Only   Metformin  And Related Other (See Comments)    Keena tolerated dose is 500mg  a day.    Oxycodone Other (See Comments)    Nausea but he can tolerate  hydrocodone   [2]  Current Outpatient Medications on File Prior to Visit  Medication Sig Dispense Refill   fexofenadine (ALLEGRA) 180 MG tablet Take 180 mg by mouth daily.     fluticasone (FLONASE) 50 MCG/ACT nasal spray Place 1 spray into both nostrils daily as needed.     glipiZIDE  (GLUCOTROL ) 5 MG tablet TAKE 2 TABLETS BY MOUTH DAILY BEFORE BREAKFAST 180 tablet 3   glucose blood (ONETOUCH VERIO) test strip Check sugar 1 time daily. E11.9 100 each 3   lisinopril  (ZESTRIL ) 5 MG tablet TAKE 1 TABLET BY MOUTH DAILY 90 tablet 3   metFORMIN  (GLUCOPHAGE ) 500 MG tablet TAKE 1 TABLET BY MOUTH DAILY WITH BREAKFAST 90 tablet 3   omeprazole  (PRILOSEC) 20 MG capsule Take 1 capsule (20 mg total) by mouth daily. (Patient taking differently: Take 20 mg by mouth daily. prn)     sildenafil  (REVATIO ) 20 MG tablet TAKE 5 TABLETS BY MOUTH DAILY AS NEEDED 50 tablet 12   triamcinolone  cream (KENALOG ) 0.5 % Apply 1 Application topically 2 (two) times daily as needed. 454 g 0   No current facility-administered medications on file prior to visit.   "

## 2024-12-28 NOTE — Patient Instructions (Addendum)
 Thank you for visiting [facility] today! Here's what we talked about: - START Mucinex  twice daily for cough/congestion, then Promethazine  syrup for cough at night only. Use Afrin nasal spray twice daily for nasal congestion - If worse/unimproved sfter 7d, call clinic

## 2025-01-01 ENCOUNTER — Ambulatory Visit: Payer: Self-pay

## 2025-01-27 ENCOUNTER — Other Ambulatory Visit

## 2025-02-03 ENCOUNTER — Ambulatory Visit: Admitting: Family Medicine

## 2025-04-21 ENCOUNTER — Encounter: Admitting: Family Medicine
